# Patient Record
Sex: Female | Born: 1945
Health system: Southern US, Community
[De-identification: ages and names within clinical notes are randomized; demographics above are authoritative.]

## PROBLEM LIST (undated history)

## (undated) DIAGNOSIS — F419 Anxiety disorder, unspecified: Secondary | ICD-10-CM

## (undated) DIAGNOSIS — R5383 Other fatigue: Secondary | ICD-10-CM

## (undated) DIAGNOSIS — M6289 Other specified disorders of muscle: Secondary | ICD-10-CM

## (undated) DIAGNOSIS — R252 Cramp and spasm: Secondary | ICD-10-CM

## (undated) DIAGNOSIS — R1310 Dysphagia, unspecified: Secondary | ICD-10-CM

## (undated) DIAGNOSIS — M7989 Other specified soft tissue disorders: Secondary | ICD-10-CM

## (undated) DIAGNOSIS — Z91018 Allergy to other foods: Secondary | ICD-10-CM

## (undated) DIAGNOSIS — R0602 Shortness of breath: Secondary | ICD-10-CM

## (undated) DIAGNOSIS — R062 Wheezing: Secondary | ICD-10-CM

## (undated) DIAGNOSIS — K5900 Constipation, unspecified: Secondary | ICD-10-CM

## (undated) DIAGNOSIS — I10 Essential (primary) hypertension: Secondary | ICD-10-CM

## (undated) DIAGNOSIS — N289 Disorder of kidney and ureter, unspecified: Secondary | ICD-10-CM

## (undated) DIAGNOSIS — F329 Major depressive disorder, single episode, unspecified: Secondary | ICD-10-CM

## (undated) DIAGNOSIS — K219 Gastro-esophageal reflux disease without esophagitis: Secondary | ICD-10-CM

## (undated) DIAGNOSIS — R197 Diarrhea, unspecified: Secondary | ICD-10-CM

## (undated) DIAGNOSIS — R350 Frequency of micturition: Secondary | ICD-10-CM

## (undated) DIAGNOSIS — E119 Type 2 diabetes mellitus without complications: Secondary | ICD-10-CM

## (undated) DIAGNOSIS — J302 Other seasonal allergic rhinitis: Secondary | ICD-10-CM

## (undated) DIAGNOSIS — J3489 Other specified disorders of nose and nasal sinuses: Secondary | ICD-10-CM

## (undated) DIAGNOSIS — F32A Depression, unspecified: Secondary | ICD-10-CM

## (undated) DIAGNOSIS — E785 Hyperlipidemia, unspecified: Secondary | ICD-10-CM

## (undated) HISTORY — DX: Gastro-esophageal reflux disease without esophagitis: K21.9

## (undated) HISTORY — DX: Hyperlipidemia, unspecified: E78.5

## (undated) HISTORY — DX: Frequency of micturition: R35.0

## (undated) HISTORY — PX: FOOT SURGERY: SHX648

## (undated) HISTORY — DX: Constipation, unspecified: K59.00

## (undated) HISTORY — DX: Essential (primary) hypertension: I10

## (undated) HISTORY — DX: Other specified disorders of nose and nasal sinuses: J34.89

## (undated) HISTORY — DX: Cramp and spasm: R25.2

## (undated) HISTORY — DX: Major depressive disorder, single episode, unspecified: F32.9

## (undated) HISTORY — DX: Other fatigue: R53.83

## (undated) HISTORY — DX: Other specified disorders of muscle: M62.89

## (undated) HISTORY — DX: Allergy to other foods: Z91.018

## (undated) HISTORY — PX: MASTECTOMY: SHX3

## (undated) HISTORY — DX: Depression, unspecified: F32.A

## (undated) HISTORY — DX: Other seasonal allergic rhinitis: J30.2

## (undated) HISTORY — DX: Shortness of breath: R06.02

## (undated) HISTORY — DX: Diarrhea, unspecified: R19.7

## (undated) HISTORY — DX: Wheezing: R06.2

## (undated) HISTORY — DX: Disorder of kidney and ureter, unspecified: N28.9

## (undated) HISTORY — DX: Dysphagia, unspecified: R13.10

## (undated) HISTORY — DX: Anxiety disorder, unspecified: F41.9

## (undated) HISTORY — DX: Type 2 diabetes mellitus without complications: E11.9

## (undated) HISTORY — DX: Other specified soft tissue disorders: M79.89

---

## 1997-07-27 HISTORY — PX: CHOLECYSTECTOMY: SHX55

## 2010-07-06 ENCOUNTER — Emergency Department (HOSPITAL_COMMUNITY)
Admission: EM | Admit: 2010-07-06 | Discharge: 2010-07-06 | Payer: Self-pay | Source: Home / Self Care | Admitting: Emergency Medicine

## 2010-08-30 ENCOUNTER — Observation Stay (HOSPITAL_COMMUNITY)
Admission: EM | Admit: 2010-08-30 | Discharge: 2010-09-02 | Disposition: A | Discharge status: Home / Self Care | Payer: 59 | Attending: Internal Medicine | Admitting: Internal Medicine

## 2010-08-30 ENCOUNTER — Emergency Department (HOSPITAL_COMMUNITY): Payer: 59

## 2010-08-30 DIAGNOSIS — Z7982 Long term (current) use of aspirin: Secondary | ICD-10-CM | POA: Insufficient documentation

## 2010-08-30 DIAGNOSIS — R5383 Other fatigue: Secondary | ICD-10-CM | POA: Insufficient documentation

## 2010-08-30 DIAGNOSIS — Z9089 Acquired absence of other organs: Secondary | ICD-10-CM | POA: Insufficient documentation

## 2010-08-30 DIAGNOSIS — Z79899 Other long term (current) drug therapy: Secondary | ICD-10-CM | POA: Insufficient documentation

## 2010-08-30 DIAGNOSIS — E669 Obesity, unspecified: Secondary | ICD-10-CM | POA: Insufficient documentation

## 2010-08-30 DIAGNOSIS — E785 Hyperlipidemia, unspecified: Secondary | ICD-10-CM | POA: Insufficient documentation

## 2010-08-30 DIAGNOSIS — R059 Cough, unspecified: Secondary | ICD-10-CM | POA: Insufficient documentation

## 2010-08-30 DIAGNOSIS — R5381 Other malaise: Secondary | ICD-10-CM | POA: Insufficient documentation

## 2010-08-30 DIAGNOSIS — E119 Type 2 diabetes mellitus without complications: Principal | ICD-10-CM | POA: Insufficient documentation

## 2010-08-30 DIAGNOSIS — R05 Cough: Secondary | ICD-10-CM | POA: Insufficient documentation

## 2010-08-30 DIAGNOSIS — R002 Palpitations: Secondary | ICD-10-CM | POA: Insufficient documentation

## 2010-08-30 DIAGNOSIS — R0989 Other specified symptoms and signs involving the circulatory and respiratory systems: Secondary | ICD-10-CM | POA: Insufficient documentation

## 2010-08-30 DIAGNOSIS — Z6831 Body mass index (BMI) 31.0-31.9, adult: Secondary | ICD-10-CM | POA: Insufficient documentation

## 2010-08-30 DIAGNOSIS — R0609 Other forms of dyspnea: Secondary | ICD-10-CM | POA: Insufficient documentation

## 2010-08-30 DIAGNOSIS — I10 Essential (primary) hypertension: Secondary | ICD-10-CM | POA: Insufficient documentation

## 2010-08-30 LAB — DIFFERENTIAL
Basophils Relative: 1 % (ref 0–1)
Eosinophils Absolute: 0 10*3/uL (ref 0.0–0.7)
Eosinophils Relative: 1 % (ref 0–5)
Lymphs Abs: 1.4 10*3/uL (ref 0.7–4.0)
Neutro Abs: 1.4 10*3/uL — ABNORMAL LOW (ref 1.7–7.7)
Neutrophils Relative %: 44 % (ref 43–77)

## 2010-08-30 LAB — CARDIAC PANEL(CRET KIN+CKTOT+MB+TROPI)
CK, MB: 0.8 ng/mL (ref 0.3–4.0)
CK, MB: 0.7 ng/mL (ref 0.3–4.0)
Relative Index: INVALID (ref 0.0–2.5)
Total CK: 68 U/L (ref 7–177)
Troponin I: 0.01 ng/mL (ref 0.00–0.06)

## 2010-08-30 LAB — POCT I-STAT, CHEM 8
BUN: 5 mg/dL — ABNORMAL LOW (ref 6–23)
Calcium, Ion: 1.25 mmol/L (ref 1.12–1.32)
HCT: 44 % (ref 36.0–46.0)
Hemoglobin: 15 g/dL (ref 12.0–15.0)
Potassium: 3.6 mEq/L (ref 3.5–5.1)
Sodium: 142 mEq/L (ref 135–145)
TCO2: 26 mmol/L (ref 0–100)

## 2010-08-30 LAB — GLUCOSE, CAPILLARY: Glucose-Capillary: 125 mg/dL — ABNORMAL HIGH (ref 70–99)

## 2010-08-30 LAB — POCT CARDIAC MARKERS
CKMB, poc: <1 ng/mL — ABNORMAL LOW (ref 1.0–8.0)
CKMB, poc: <1 ng/mL — ABNORMAL LOW (ref 1.0–8.0)
Troponin i, poc: <0.05 ng/mL (ref 0.00–0.09)
Troponin i, poc: <0.05 ng/mL (ref 0.00–0.09)

## 2010-08-30 LAB — CBC
MCV: 91.7 fL (ref 78.0–100.0)
RBC: 4.72 MIL/uL (ref 3.87–5.11)
WBC: 3.3 10*3/uL — ABNORMAL LOW (ref 4.0–10.5)

## 2010-08-31 LAB — COMPREHENSIVE METABOLIC PANEL
AST: 16 U/L (ref 0–37)
Albumin: 3.8 g/dL (ref 3.5–5.2)
BUN: 10 mg/dL (ref 6–23)
CO2: 28 mEq/L (ref 19–32)
Chloride: 104 mEq/L (ref 96–112)
Creatinine, Ser: 0.99 mg/dL (ref 0.4–1.2)
GFR calc Af Amer: >60 mL/min (ref >60)
Glucose, Bld: 116 mg/dL — ABNORMAL HIGH (ref 70–99)
Potassium: 3.9 mEq/L (ref 3.5–5.1)
Total Protein: 6.6 g/dL (ref 6.0–8.3)

## 2010-08-31 LAB — CBC
Hemoglobin: 14.2 g/dL (ref 12.0–15.0)
MCHC: 34.3 g/dL (ref 30.0–36.0)
RBC: 4.5 MIL/uL (ref 3.87–5.11)

## 2010-08-31 LAB — HEMOGLOBIN A1C
Hgb A1c MFr Bld: 6 % — ABNORMAL HIGH (ref <5.7)
Mean Plasma Glucose: 126 mg/dL — ABNORMAL HIGH (ref <117)

## 2010-08-31 LAB — GLUCOSE, CAPILLARY
Glucose-Capillary: 110 mg/dL — ABNORMAL HIGH (ref 70–99)
Glucose-Capillary: 116 mg/dL — ABNORMAL HIGH (ref 70–99)

## 2010-08-31 LAB — MAGNESIUM: Magnesium: 2.3 mg/dL (ref 1.5–2.5)

## 2010-08-31 LAB — LIPID PANEL
LDL Cholesterol: 55 mg/dL (ref 0–99)
Total CHOL/HDL Ratio: 2.6 RATIO

## 2010-08-31 LAB — T4, FREE: Free T4: 1.61 ng/dL (ref 0.80–1.80)

## 2010-09-01 DIAGNOSIS — R578 Other shock: Secondary | ICD-10-CM

## 2010-09-01 DIAGNOSIS — R002 Palpitations: Secondary | ICD-10-CM

## 2010-09-01 LAB — CARDIAC PANEL(CRET KIN+CKTOT+MB+TROPI)
CK, MB: 0.9 ng/mL (ref 0.3–4.0)
Relative Index: INVALID (ref 0.0–2.5)
Troponin I: 0.01 ng/mL (ref 0.00–0.06)

## 2010-09-01 LAB — CBC
HCT: 39.8 % (ref 36.0–46.0)
Hemoglobin: 14 g/dL (ref 12.0–15.0)
MCHC: 35.2 g/dL (ref 30.0–36.0)
MCV: 91.3 fL (ref 78.0–100.0)
Platelets: 167 10*3/uL (ref 150–400)
RDW: 12.9 % (ref 11.5–15.5)
WBC: 4.3 10*3/uL (ref 4.0–10.5)

## 2010-09-01 LAB — COMPREHENSIVE METABOLIC PANEL
ALT: 20 U/L (ref 0–35)
AST: 18 U/L (ref 0–37)
Alkaline Phosphatase: 38 U/L — ABNORMAL LOW (ref 39–117)
Calcium: 9.8 mg/dL (ref 8.4–10.5)
GFR calc Af Amer: >60 mL/min (ref >60)
GFR calc non Af Amer: 60 mL/min — ABNORMAL LOW (ref >60)
Total Bilirubin: 0.6 mg/dL (ref 0.3–1.2)

## 2010-09-01 LAB — DIFFERENTIAL
Basophils Absolute: 0 10*3/uL (ref 0.0–0.1)
Basophils Relative: 1 % (ref 0–1)
Neutrophils Relative %: 42 % — ABNORMAL LOW (ref 43–77)

## 2010-09-01 LAB — MAGNESIUM: Magnesium: 2.2 mg/dL (ref 1.5–2.5)

## 2010-09-01 LAB — GLUCOSE, CAPILLARY: Glucose-Capillary: 96 mg/dL (ref 70–99)

## 2010-09-02 ENCOUNTER — Ambulatory Visit (HOSPITAL_COMMUNITY): Payer: 59

## 2010-09-02 DIAGNOSIS — R079 Chest pain, unspecified: Secondary | ICD-10-CM

## 2010-09-02 DIAGNOSIS — R002 Palpitations: Secondary | ICD-10-CM | POA: Insufficient documentation

## 2010-09-02 LAB — GLUCOSE, CAPILLARY
Glucose-Capillary: 101 mg/dL — ABNORMAL HIGH (ref 70–99)
Glucose-Capillary: 126 mg/dL — ABNORMAL HIGH (ref 70–99)

## 2010-09-02 MED ORDER — TECHNETIUM TC 99M TETROFOSMIN IV KIT
30.0000 | PACK | Freq: Once | INTRAVENOUS | Status: AC | PRN
  Administered 2010-09-02: 30 via INTRAVENOUS

## 2010-09-02 MED ORDER — TECHNETIUM TC 99M TETROFOSMIN IV KIT
10.0000 | PACK | Freq: Once | INTRAVENOUS | Status: AC | PRN
  Administered 2010-09-02: 10 via INTRAVENOUS

## 2010-09-03 ENCOUNTER — Observation Stay (HOSPITAL_COMMUNITY): Payer: 59

## 2010-09-06 NOTE — H&P (Signed)
Molly [name], Molly [name]                 ACCOUNT NO.:  [id number]  MEDICAL RECORD NO.:  [id number]           PATIENT TYPE:  E  LOCATION:  WLED                         FACILITY:  [facility]  PHYSICIAN:  Pleas Koch, MD        DATE OF BIRTH:  [birth year]  DATE OF ADMISSION:  08/30/2010 DATE OF DISCHARGE:                             HISTORY & PHYSICAL   CHIEF COMPLAINT: 1. Palpitations. 2. Chest pain.  HISTORY OF PRESENT ILLNESS:  The patient is a pleasant 65 year old African-American female who presented to the emergency room this morning status post having dyspnea and awakening with dyspnea and palpitations last p.m.  She states this is both coming going sort of chest pains made better by oxygen in the emergency room.  She states that she had a similar episode of this in July 06, 2010 and was sent home.  She states it is more of sort of tightness in the chest and deep breathing seems to make it better.  She admits that in the past couple months, she has not been as active as she used to be, used to go to the gym; however, the past 2 months, she has just been feeling more fatigued and has not been able to do as much activity as usual.  She states however she can walk up stairs and can do usual activities but a little lower than she used to.  She states the shortness of breath is worsened when she is lying flat.  She has no radiation of her pain down the arm.  She has no radiation of pain into the chest.  No reflux symptoms.  She denies any fever, cough, cold, nausea, or vomiting.  She states she has had palpitations which slows down when she controls her breathing.  She has no history of diarrhea, no shortness of breath, and no dysuria and can control her urine.  EKG in the emergency room was nonspecific.  It showed a rate about 105, axis of about 0, her PR interval is 0.12, her QT is less than half RR. There appears to be perhaps some flipped T's in V6, and she does not meet  criterion for left ventricular hypertrophy.  PAST MEDICAL HISTORY:  She has had hypertension and had recent change of medications, she is not sure what.  She has history of hyperlipidemia and diabetes mellitus.  She has never had a stroke or heart attack in the past.  She has not had a history of cancer.  PAST SURGICAL HISTORY:  Gallbladder was removed 12 years ago and it was a complicated surgery.  CURRENT MEDICATIONS: 1. Crestor. 2. Metformin. 3. Lisinopril/hydrochlorothiazide. 4. Aspirin 81 mg.  FAMILY HISTORY:  She had a sister who had heart attack at age 69.  She had a dad who had also heart issues.  She has a sister with diabetes and all the family seems to have blood pressure.  REVIEW OF SYSTEMS:  As above.  PHYSICAL EXAMINATION:  VITAL SIGNS:  Blood pressure 141/94, pulse rate 98-109, respirations 18, and pulse ox 98% on 2 L oxygen, the patient removed the oxygen  and was satting at 100% on room. GENERAL:  The patient is an alert, oriented, obese African-American female. HEENT:  No pallor.  No icterus.  Throat is clear. NECK:  No JVD.  No carotid bruit. HEART:  S1, S2.  No murmurs, rubs, or gallops.  No tactile vocal fremitus or resonance. ABDOMEN:  Soft, nontender, and nondistended.  No rebound or guarding. EXTREMITIES:  Soft.  She does not have any swelling at present.  LABORATORY DATA:  Lab work done shows WBC 3.3, hemoglobin 15.1, hematocrit 43.3, and platelets of 170,000.  Sodium 142, potassium 3.6, chloride 104, glucose 120, BUN 5, creatinine 0.9, D-dimer of 0.26, point care markers were negative.  Two-view chest x-ray showed minimal right lower lobe airspace disease, likely atelectasis.  ASSESSMENT/PLAN:  This is a 65 year old African-American female with new onset increasing shortness of breath and palpitations for the past but in reality about the past month.  We will admit her and rule out coronary artery disease.  We will get an echocardiogram to be read  by Dr. Yates Decamp. If these are normal, she and her daughter state that she has an appointment with cardiologist in [facility] and she will be reviewed there subsequent to this.  She will be placed on moderate sensitivity sliding scale and heart healthy diet and I will get an HbA1c and a fasting lipid panel on August 31, 2010.  If she rules out, we can likely safely discharge her.  She will be kept as an observation under [employer].          ______________________________ Pleas Koch, MD     JS/MEDQ  D:  08/30/2010  T:  08/30/2010  Job:  [id number]  Electronically Signed by Pleas Koch MD on 09/01/2010 11:43:10 AM

## 2010-09-10 NOTE — Consult Note (Signed)
NAMEKALANDRA, MASTERS                 ACCOUNT NO.:  [id number]  MEDICAL RECORD NO.:  [id number]           PATIENT TYPE:  LOCATION:                                 FACILITY:  PHYSICIAN:  Landan Fedie M. [place], M.D.  DATE OF BIRTH:  November 17, 1945  DATE OF CONSULTATION:  09/01/2010 DATE OF DISCHARGE:                                CONSULTATION   PRIMARY CARE PHYSICIAN:  Located at the [facility].  REASON FOR CONSULTATION:  Palpitations/chest discomfort.  HISTORY OF PRESENT ILLNESS:  Ms. Slatten is a 65 year old African American female with no known history of coronary artery disease but risk factors of hypertension, hyperlipidemia, as well as obesity, and CAD equivalent of diabetes mellitus (however, excellent control on minimal dose of metformin, only 2 years on medication) who presents with recurrent palpitations with chest tightness and orthopnea, first noted approximately 2 months ago.  The patient was in her usual state of health until she started noting mild orthopnea in November 2011 that was easily resolved with using 2-3 pills rather than 1-2 pillows.  However, in December, she noted tachy palpitations, most closely associated with lying supine and had chest tightness with these symptoms.  The patient says usually they are very brief in duration, and she can improve them by sitting up and taking a few deep breaths.  Unfortunately, in December, she had a prolonged episode, so she presented to [facility] ED for further eval.  At that time, she had two sets of enzymes checked that were negative and EKG was unconcerning and her symptoms improved in the emergency department, and therefore, she was sent home to follow up with her primary care. Unfortunately, her symptoms recurred again leading to her admission on August 30, 2010, by the hospital service again similar in character but this time lasting even longer.  Since being admitted, the patient has had two sets of  point-of-care markers that were negative and three full sets of enzymes that were negative.  Her EKG shows nonspecific changes only and labs, otherwise, unremarkable.  Chest x-ray showed minimal right lower lobe airspace disease.  Review of telemetry shows sinus bradycardia in the 50s to sinus tach in the 110s.  Of note, the patient has had mild episodes of her symptoms leading to her eval in the hospital and noted to be on telemetry in the 110s.  Vital signs within normal limits and stable.  Currently, the patient is asymptomatic and states that she normally does not have symptoms unless she lies flat or overexerts herself.  Of note, she was exercising fairly regularly up until a month ago and had no exertional symptoms at that time.  She does report now if she overexerts herself she will have similar symptoms of tachy palpitations and chest tightness, not significantly short of breath but again resolving with a few deep breaths.  The patient had a 2- D echocardiogram on August 31, 2010, that showed normal left ventricular systolic function but mild LVH.  No regional wall motion abnormalities.  Of note, her right ventricle was mildly dilated with moderate RVH and mildly reduced systolic function, but  there is no documentation of pulmonary hypertension. The patient has chronic mild lower extremity edema that resolved with her feet up that has not changed recently.  Her weight if anything has gone down over the last few weeks.  PAST MEDICAL HISTORY: 1. NIDDM (excellent control on metformin 500 mg daily x2 years). 2. Hypertension (moderate control x25 years). 3. Hyperlipidemia x8 years. 4. Obesity (BMI 31.9). 5. S/P cholecystectomy 12 years ago.  SOCIAL HISTORY:  The patient lives in [place] with her family.  She has remote tobacco abuse quitting 25 years ago.  She rarely consumes alcohol and never even comes close to binge drinking.  No illegal drugs. No herbal meds.  Regular diet  and regular exercise up until a month ago, now intermittent exercise.  FAMILY HISTORY:  The patient's father had "heart trouble" but not exactly sure of the problem, nor age of onset.  Older sister had a pacemaker placed in her late 28s.  Mother with CVA and deceased at age 61.  REVIEW OF SYSTEMS:  Please see HPI.  All other systems were reviewed and were negative.  CODE STATUS:  Full.  ALLERGIES:  No known drug allergies.  MEDICATIONS:  Metformin, lisinopril/hydrochlorothiazide, enteric-coated aspirin 81 mg p.o. daily, Crestor.  PHYSICAL EXAMINATION:  VITAL SIGNS:  Temperature 98.0 degrees Fahrenheit with BP 109-135 over 73-82, pulse 84-96, and respiration rate 16-22, O2 saturation 97% on room air. GENERAL:  The patient is alert, oriented x3, in no apparent distress, able to speak easily in full sentences without respiratory distress. HEENT:  Head is normocephalic, atraumatic.  Pupils equal, round, and reactive to light.  Extraocular movements are intact.  Nares are patent without discharge.  Oropharynx without erythema or exudates. NECK:  Supple without lymphadenopathy.  No thyromegaly.  No JVD. CARDIAC:  Heart rate is regular with audible S1 and S2.  No clicks, rubs, murmurs, or gallops, and pulses are 2+ and equal in both upper and lower extremities bilaterally. LUNGS:  Clear to auscultation bilaterally. SKIN:  No rashes, lesions, or petechiae. ABDOMEN:  Soft, nontender, nondistended.  Normal abdominal bowel sounds. No rebound or guarding.  No hepatosplenomegaly.  The patient is obese. EXTREMITIES:  Without clubbing, cyanosis, or edema. MUSCULOSKELETAL:  Without joint deformity or effusions.  No spinal or CVA tenderness. NEUROLOGIC:  Cranial nerves II through XII grossly intact.  Strength 5/5 in all extremities and axial groups.  Normal sensation throughout. Normal cerebellar function.  RADIOLOGY: 1. Chest x-ray shows minimal right lower lobe airspace disease,      question atelectasis. 2. EKG; rhythm sinus tachycardia at a rate of 105 bpm with left axis     deviation but no significant ST-T wave changes, question of     significant Q-waves in V1 only, PR 180, QRS 100, QTC of 475.  No     prior tracing for comparison.  LABORATORY DATA:  WBC is 4.3, HGB 14.0, HCT 39.8, PLT count is 167. Sodium 142, potassium 3.8, chloride 107, bicarb 27, BUN 10, creatinine 0.94, glucose 127.  Liver function tests within normal limits.  Free T4 and free T3 both within normal limits.  HbA1c 6.0%.  Magnesium 2.2, calcium 9.8.  Total cholesterol 110, triglyceride 60, HDL 43, LDL 55. Point-of-care markers negative x2.  Three full sets of enzymes negative.  ASSESSMENT AND PLAN:  The patient was initially seen by Jarrett Ables, PA-C, and then seen and thoroughly assessed by attending cardiologist, Dr. Saxon Crosby [place].  Ms. Stanke is a 65 year old African American female with  no known history of coronary artery disease but coronary artery disease equivalent of diabetes mellitus (excellent control on low-dose metformin times only 2 years), and risk factors of moderately controlled hypertension x25 years, well-controlled hyperlipidemia, and obesity who presents with mostly atypical chest discomfort without objective evidence of ischemia.  Chest pain.  Given her recurrent symptoms and higher pretest probability even in the absence of objective evidence and her mostly atypical history, it is reasonable to perform an inpatient treadmill stress Myoview.  We will arrange for tomorrow morning.  If negative, no further workup indicated at this time.  Thanks for the consult.     Jarrett Ables, PAC   ______________________________ Robertine Kipper M. [place], M.D.    MS/MEDQ  D:  09/01/2010  T:  09/02/2010  Job:  [id number]  Electronically Signed by Jarrett Ables PAC on 09/08/2010 11:18:57 AM Electronically Signed by Vicky Schleich [place] M.D. on 09/10/2010 11:16:27 AM

## 2010-09-10 NOTE — Discharge Summary (Signed)
  Molly [name], Molly [name]                 ACCOUNT NO.:  [id number]  MEDICAL RECORD NO.:  [id number]           PATIENT TYPE:  I  LOCATION:  1401                         FACILITY:  [facility]  PHYSICIAN:  Zannie Cove, MD     DATE OF BIRTH:  September 23, 1945  DATE OF ADMISSION:  08/30/2010 DATE OF DISCHARGE:  09/02/2010                              DISCHARGE SUMMARY   PRIMARY CARE PHYSICIAN:  Dr. Ivory Broad, [facility].  DISCHARGE DIAGNOSES: 1. Atypical chest pain. 2. Diabetes type 2. 3. Hypertension. 4. Dyslipidemia. 5. Obesity. 6. Status post cholecystectomy. 7. Suspected obstructive sleep apnea.  Sleep study pending.  CONSULTATION:  Dr. Royann Shivers in [facility].  DISCHARGE MEDICATIONS: 1. Aspirin 81 mg daily. 2. Fish oil over the counter 1 tablet daily. 3. Crestor 10 mg daily. 4. Lisinopril/hydrochlorothiazide 20/12.5 one tablet daily. 5. Multivitamin 1 tablet daily. 6. Metformin XR 500 mg p.o. at bedtime.  DIAGNOSTIC INVESTIGATIONS:  Chest x-ray, minimal right lower lobe airspace disease, slightly atelectasis.  Myoview September 02, 2010, no evidence of pharmacologically-induced ischemia.  HOSPITAL COURSE:  [name] is a 65 year old female with history of diabetes, hypertension, dyslipidemia, and obesity who presented to the hospital with chest discomfort that subsequently resolved.  She was ruled out for MI subsequently due to risk factors, seen by [facility] in consultation with a Myoview, which did not show any evidence of ischemia.  She also had a 2-D echocardiogram that showed normal ejection fraction, EF of 60% to 65% with normal wall motion, mild LVH, right ventricle mildly dilated, systolic function mildly decreased, it may represent chronic cor pulmonale.  Clinical correlation recommended.  In addition, the patient's daughter also reports unexplained fatigue and dyspnea on exertion.  For this, we recommended a sleep study to be done as an outpatient  and the patient is given the number for [facility].  DISCHARGE CONDITION:  Stable.  VITAL SIGNS:  Temperature is 98, pulse 98, blood pressure 118/81, respirations 16, saturating 100% on room air.     Zannie Cove, MD     PJ/MEDQ  D:  09/02/2010  T:  09/03/2010  Job:  [id number]  cc:   Dr. Ivory Broad  Electronically Signed by Zannie Cove  on 09/10/2010 02:14:06 PM

## 2010-09-17 ENCOUNTER — Institutional Professional Consult (permissible substitution): Payer: 59 | Admitting: Pulmonary Disease

## 2010-10-07 LAB — CBC
MCH: 31.7 pg (ref 26.0–34.0)
MCHC: 34.5 g/dL (ref 30.0–36.0)
Platelets: 171 10*3/uL (ref 150–400)
RDW: 12.7 % (ref 11.5–15.5)

## 2010-10-07 LAB — DIFFERENTIAL
Basophils Absolute: 0 10*3/uL (ref 0.0–0.1)
Basophils Relative: 0 % (ref 0–1)
Eosinophils Absolute: 0 10*3/uL (ref 0.0–0.7)
Neutro Abs: 2.2 10*3/uL (ref 1.7–7.7)
Neutrophils Relative %: 49 % (ref 43–77)

## 2010-10-07 LAB — BASIC METABOLIC PANEL
CO2: 27 mEq/L (ref 19–32)
Calcium: 10 mg/dL (ref 8.4–10.5)
Creatinine, Ser: 0.94 mg/dL (ref 0.4–1.2)
GFR calc non Af Amer: 60 mL/min — ABNORMAL LOW (ref >60)
Glucose, Bld: 134 mg/dL — ABNORMAL HIGH (ref 70–99)
Sodium: 141 mEq/L (ref 135–145)

## 2010-10-07 LAB — D-DIMER, QUANTITATIVE: D-Dimer, Quant: <0.22 ug/mL-FEU (ref 0.00–0.48)

## 2010-10-07 LAB — TROPONIN I: Troponin I: 0.02 ng/mL (ref 0.00–0.06)

## 2011-07-08 ENCOUNTER — Other Ambulatory Visit: Payer: Self-pay | Admitting: Neurology

## 2011-07-08 DIAGNOSIS — F411 Generalized anxiety disorder: Secondary | ICD-10-CM

## 2011-07-08 DIAGNOSIS — F060 Psychotic disorder with hallucinations due to known physiological condition: Secondary | ICD-10-CM

## 2011-07-08 DIAGNOSIS — F4323 Adjustment disorder with mixed anxiety and depressed mood: Secondary | ICD-10-CM

## 2011-12-31 IMAGING — CR DG CHEST 2V
2 series · 2 of 2 positions shown · non-contrast
Comparison: None.

CLINICAL DATA: Shortness of breath

CHEST - 2 VIEW

[w chest pa]
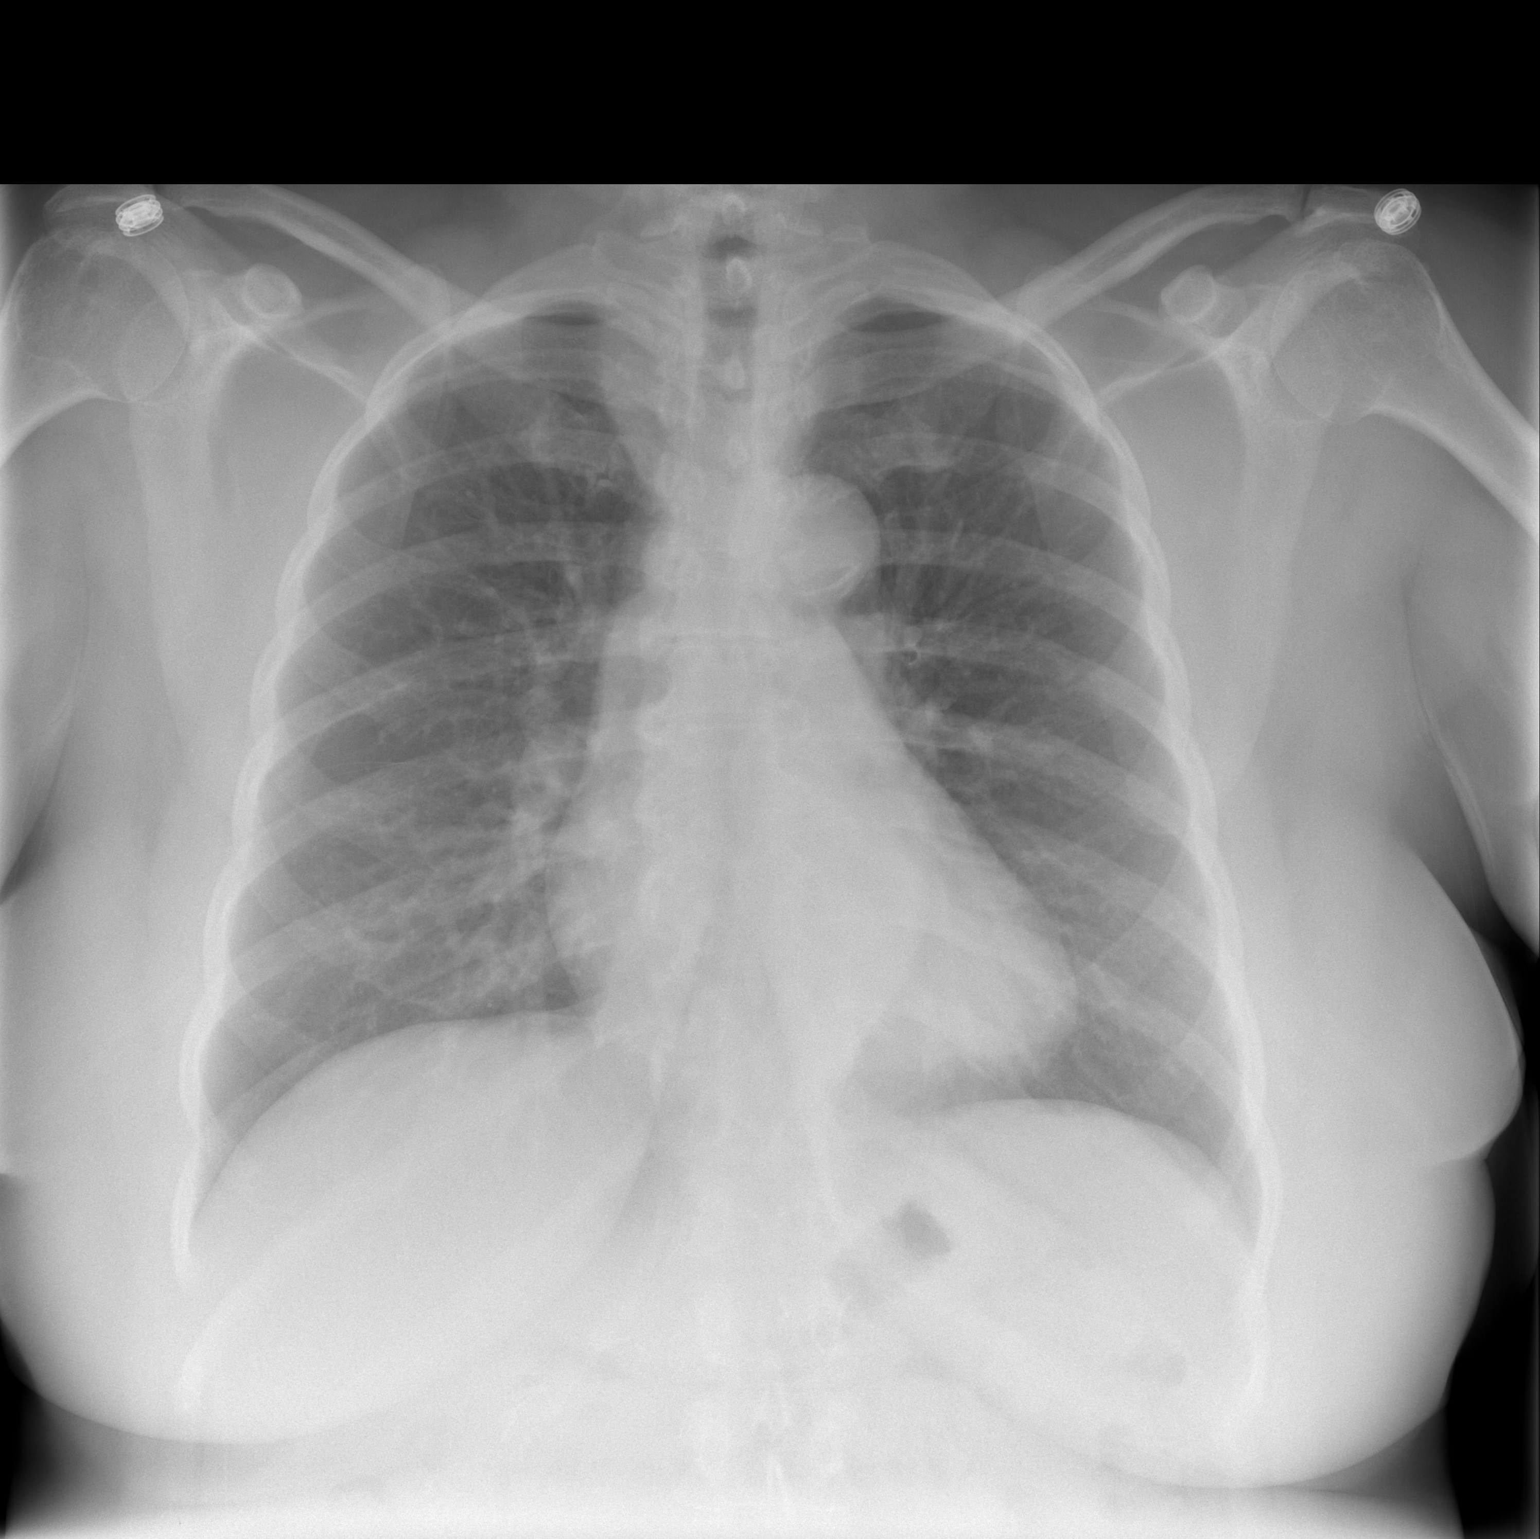

[w chest lat]
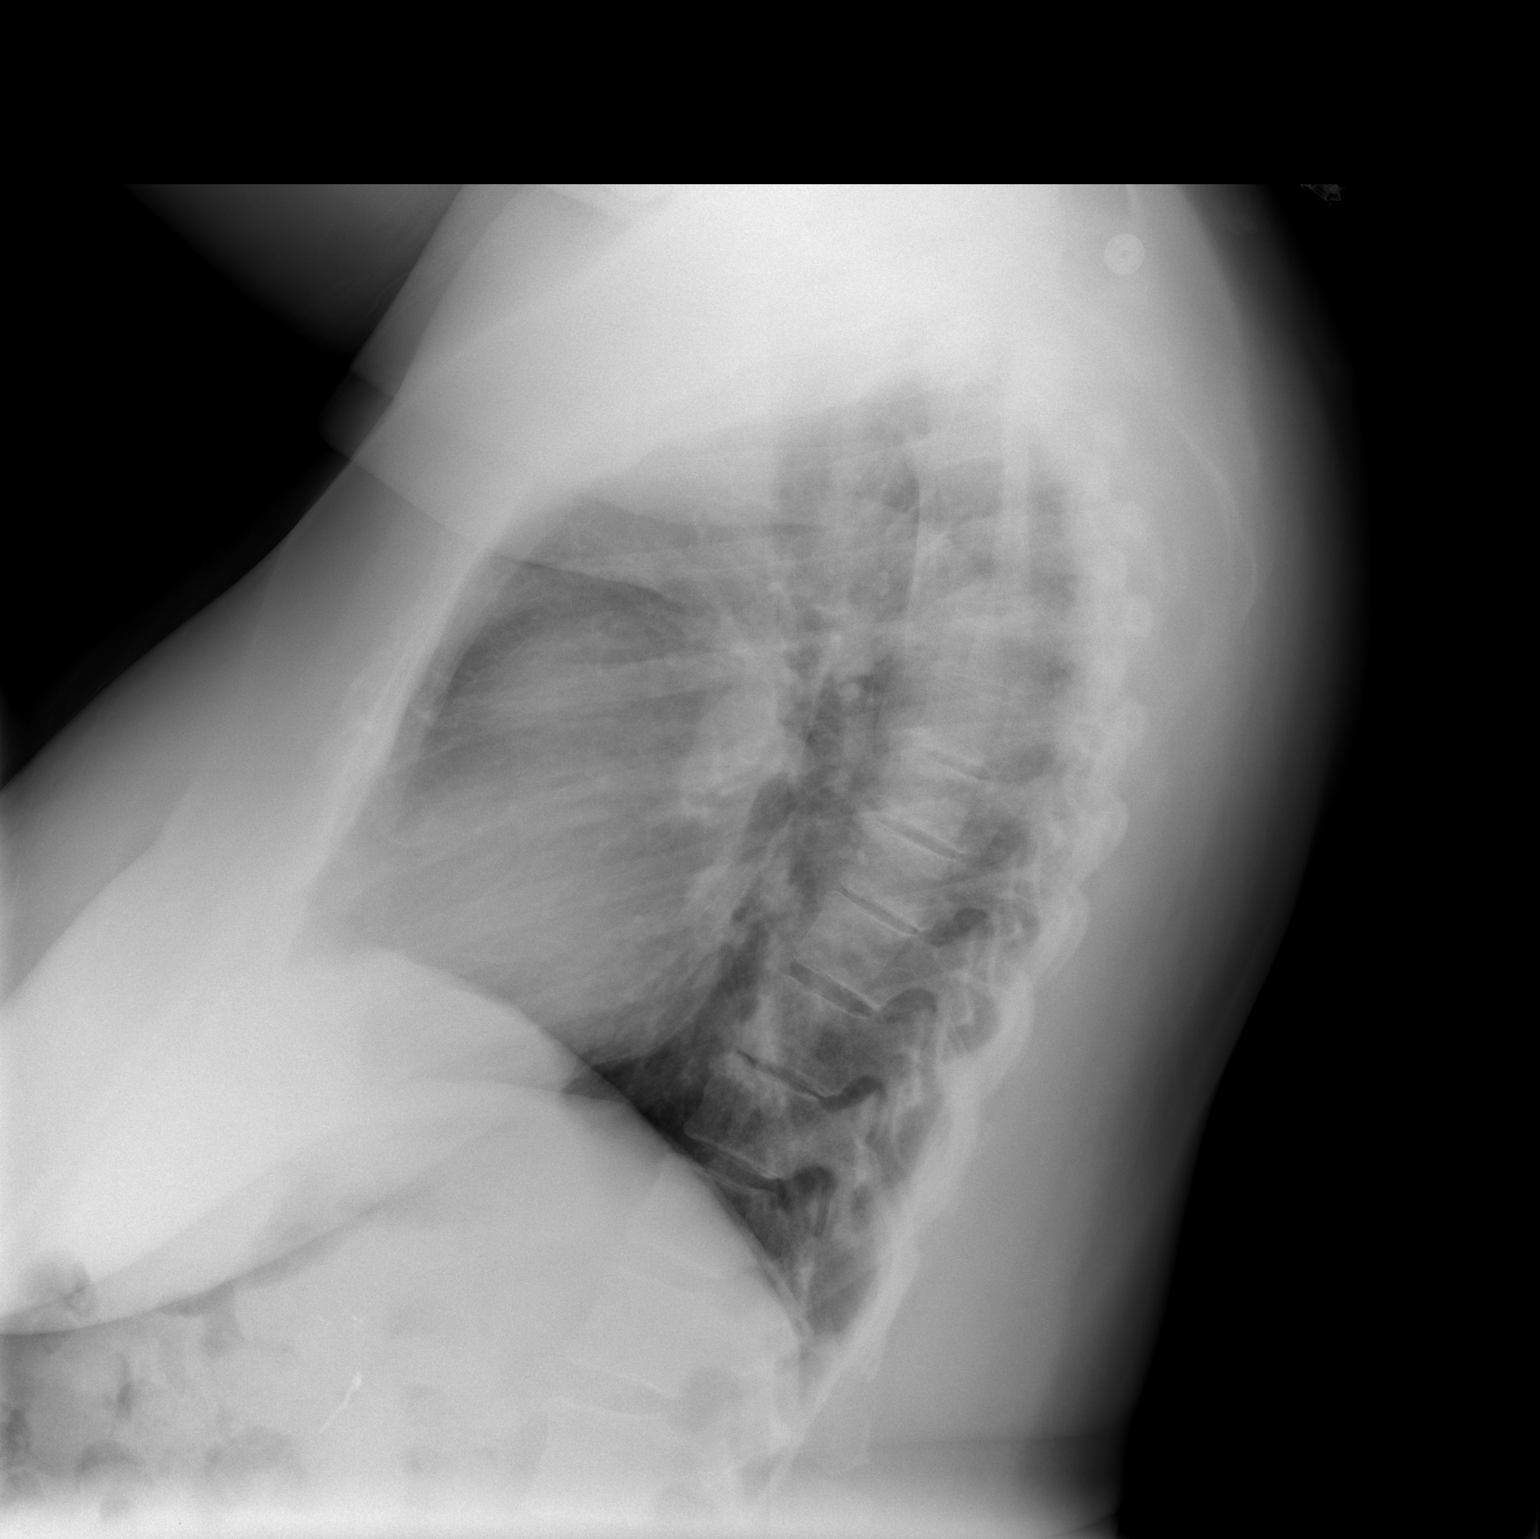

[2 of 2 positions shown; findings below may reference images not displayed]

FINDINGS: Aorta is ectatic and unfolded.  Heart size is normal.
The lungs are clear.  No pleural effusion. Cholecystectomy clips
noted.
IMPRESSION: No acute cardiopulmonary process.

## 2016-03-23 ENCOUNTER — Ambulatory Visit (INDEPENDENT_AMBULATORY_CARE_PROVIDER_SITE_OTHER): Payer: Medicare HMO | Admitting: Allergy and Immunology

## 2016-03-23 ENCOUNTER — Encounter: Payer: Self-pay | Admitting: Allergy and Immunology

## 2016-03-23 VITALS — BP 126/88 | HR 96 | Temp 98.0°F | Resp 16 | Ht 63.0 in | Wt 224.8 lb

## 2016-03-23 DIAGNOSIS — L500 Allergic urticaria: Secondary | ICD-10-CM | POA: Diagnosis not present

## 2016-03-23 DIAGNOSIS — T7800XA Anaphylactic reaction due to unspecified food, initial encounter: Secondary | ICD-10-CM

## 2016-03-23 DIAGNOSIS — K2970 Gastritis, unspecified, without bleeding: Secondary | ICD-10-CM

## 2016-03-23 DIAGNOSIS — T783XXA Angioneurotic edema, initial encounter: Secondary | ICD-10-CM | POA: Diagnosis not present

## 2016-03-23 DIAGNOSIS — J3089 Other allergic rhinitis: Secondary | ICD-10-CM | POA: Diagnosis not present

## 2016-03-23 MED ORDER — LEVOCETIRIZINE DIHYDROCHLORIDE 5 MG PO TABS
5.0000 mg | ORAL_TABLET | Freq: Every evening | ORAL | 5 refills | Status: DC
Start: 2016-03-23 — End: 2022-10-05

## 2016-03-23 MED ORDER — EPINEPHRINE 0.3 MG/0.3ML IJ SOAJ: 0.3000 mg | Freq: Once | INTRAMUSCULAR | 2 refills | Status: AC

## 2016-03-23 MED ORDER — FLUTICASONE PROPIONATE 50 MCG/ACT NA SUSP: 2.0000 | Freq: Every day | NASAL | 5 refills | Status: DC | PRN

## 2016-03-23 NOTE — Assessment & Plan Note (Signed)
Associated angioedema occurs in up to 50% of patients with chronic urticaria.  Treatment/diagnostic plan as outlined above. 

## 2016-03-23 NOTE — Patient Instructions (Addendum)
Recurrent urticaria Unclear etiology. Skin tests to select food allergens were negative today with the exception of shellfish mix, however her recent symptoms have not correlated with the consumption of shellfish. NSAIDs and emotional stress commonly exacerbate urticaria but are not the underlying etiology in this case. Physical urticarias are negative by history (i.e. pressure-induced, temperature, vibration, solar, etc.). There are no concomitant symptoms concerning for anaphylaxis or constitutional symptoms worrisome for an underlying malignancy. We will rule out other potential etiologies with labs. For symptom relief, patient is to take oral antihistamines as directed.  The following labs have been ordered: FCeRI antibody, TSH, anti-thyroglobulin antibody, thyroid peroxidase antibody, tryptase, urea breath test, CBC, CMP, ESR, ANA, and galactose-alpha-1,3-galactose IgE level.  The patient will be called with further recommendations after lab results have returned.  Instructions have been discussed and provided for H1/H2 receptor blockade with titration to find lowest effective dose.  A prescription has been provided for levocetirizine, 5 mg daily as needed.  A journal is to be kept recording any foods eaten, beverages consumed, medications taken within a 6 hour period prior to the onset of symptoms, as well as record activities being performed, and environmental conditions. For any symptoms concerning for anaphylaxis, 911 is to be called immediately.  Angioedema Associated angioedema occurs in up to 50% of patients with chronic urticaria.  Treatment/diagnostic plan as outlined above.  Food allergy The patient's history suggests shellfish allergy and positive skin test results today confirm this diagnosis.  Meticulous avoidance of shellfish as discussed.  A prescription has been provided for epinephrine auto-injector 2 pack along with instructions for proper administration.  A food  allergy action plan has been provided and discussed.  Medic Alert identification is recommended.  Other allergic rhinitis  Aeroallergen avoidance measures have been discussed and provided in written form.  A prescription has been provided for levocetirizine (as above).  A prescription has been provided for fluticasone nasal spray, 2 sprays per nostril daily as needed. Proper nasal spray technique has been discussed and demonstrated.  I have also recommended nasal saline spray (i.e. Simply Saline) as needed prior to medicated nasal sprays.   When lab results have returned the patient will be called with further recommendations and follow up instructions.  Urticaria (Hives)  . Levocetirizine (Xyzal) 5 mg twice a day and ranitidine (Zantac) 150 mg twice a day. If no symptoms for 7-14 days then decrease to. . Levocetirizine (Xyzal) 5 mg twice a day and ranitidine (Zantac) 150 mg once a day.  If no symptoms for 7-14 days then decrease to. . Levocetirizine (Xyzal) 5 mg twice a day.  If no symptoms for 7-14 days then decrease to. . Levocetirizine (Xyzal) 5 mg once a day.  May use Benadryl (diphenhydramine) as needed for breakthrough symptoms       If symptoms return, then step up dosage  Reducing Pollen Exposure  The American Academy of Allergy, Asthma and Immunology suggests the following steps to reduce your exposure to pollen during allergy seasons.    1. Do not hang sheets or clothing out to dry; pollen may collect on these items. 2. Do not mow lawns or spend time around freshly cut grass; mowing stirs up pollen. 3. Keep windows closed at night.  Keep car windows closed while driving. 4. Minimize morning activities outdoors, a time when pollen counts are usually at their highest. 5. Stay indoors as much as possible when pollen counts or humidity is high and on windy days when pollen tends to remain  in the air longer. 6. Use air conditioning when possible.  Many air conditioners have  filters that trap the pollen spores. 7. Use a HEPA room air filter to remove pollen form the indoor air you breathe.

## 2016-03-23 NOTE — Assessment & Plan Note (Signed)
The patient's history suggests shellfish allergy and positive skin test results today confirm this diagnosis.  Meticulous avoidance of shellfish as discussed.  A prescription has been provided for epinephrine auto-injector 2 pack along with instructions for proper administration.  A food allergy action plan has been provided and discussed.  Medic Alert identification is recommended. 

## 2016-03-23 NOTE — Assessment & Plan Note (Signed)
Unclear etiology. Skin tests to select food allergens were negative today with the exception of shellfish mix, however her recent symptoms have not correlated with the consumption of shellfish. NSAIDs and emotional stress commonly exacerbate urticaria but are not the underlying etiology in this case. Physical urticarias are negative by history (i.e. pressure-induced, temperature, vibration, solar, etc.). There are no concomitant symptoms concerning for anaphylaxis or constitutional symptoms worrisome for an underlying malignancy. We will rule out other potential etiologies with labs. For symptom relief, patient is to take oral antihistamines as directed.  The following labs have been ordered: FCeRI antibody, TSH, anti-thyroglobulin antibody, thyroid peroxidase antibody, tryptase, urea breath test, CBC, CMP, ESR, ANA, and galactose-alpha-1,3-galactose IgE level.  The patient will be called with further recommendations after lab results have returned.  Instructions have been discussed and provided for H1/H2 receptor blockade with titration to find lowest effective dose.  A prescription has been provided for levocetirizine, 5 mg daily as needed.  A journal is to be kept recording any foods eaten, beverages consumed, medications taken within a 6 hour period prior to the onset of symptoms, as well as record activities being performed, and environmental conditions. For any symptoms concerning for anaphylaxis, 911 is to be called immediately.

## 2016-03-23 NOTE — Assessment & Plan Note (Signed)
   Aeroallergen avoidance measures have been discussed and provided in written form.  A prescription has been provided for levocetirizine (as above).  A prescription has been provided for fluticasone nasal spray, 2 sprays per nostril daily as needed. Proper nasal spray technique has been discussed and demonstrated.  I have also recommended nasal saline spray (i.e. Simply Saline) as needed prior to medicated nasal sprays.

## 2016-03-23 NOTE — Progress Notes (Signed)
New Patient Note  RE: Molly [name] MRN: [id number] DOB: [birth year] Date of Office Visit: 03/23/2016  Referring provider: Daphene Calamity,* Primary care provider: Daphene Calamity, MD  Chief Complaint: Urticaria and Angioedema   History of present illness: Molly [name] is a 70 y.o. female presenting today for consultation of hives. Over the past 1-2 months, [name] has experienced recurrent episodes of hives. Typical distribution includes the face, back, arms and legs.  The lesions are described as erythematous, raised, and pruritic.  Individual hives last less than 24 hours without leaving residual pigmentation or bruising. She has experienced associated facial swelling 4-5 times but denies concomitant cardiopulmonary symptoms and GI symptoms. She has not experienced unexpected weight loss, recurrent fevers or drenching night sweats. No specific medication, food or environmental triggers have been identified. The symptoms do not seem to correlate with NSAIDs use or emotional stress. She did not have signs or symptoms of infection at the time of symptom onset. [name] has tried to control symptoms with OTC antihistamines which have offered moderate temporary relief of symptoms. She has not been evaluated and treated in the emergency department for these symptoms. Skin biopsy has not been performed. Currently developing hives 2 times per week on average. [name] experiences nasal congestion, rhinorrhea, sneezing, and occasional ear pressure.  These symptoms occur year around but are more frequent in the spring and fall.   Assessment and plan: Recurrent urticaria Unclear etiology. Skin tests to select food allergens were negative today with the exception of shellfish mix, however her recent symptoms have not correlated with the consumption of shellfish. NSAIDs and emotional stress commonly exacerbate urticaria but are not the underlying etiology in this case. Physical urticarias are negative by  history (i.e. pressure-induced, temperature, vibration, solar, etc.). There are no concomitant symptoms concerning for anaphylaxis or constitutional symptoms worrisome for an underlying malignancy. We will rule out other potential etiologies with labs. For symptom relief, patient is to take oral antihistamines as directed.  The following labs have been ordered: FCeRI antibody, TSH, anti-thyroglobulin antibody, thyroid peroxidase antibody, tryptase, urea breath test, CBC, CMP, ESR, ANA, and galactose-alpha-1,3-galactose IgE level.  The patient will be called with further recommendations after lab results have returned.  Instructions have been discussed and provided for H1/H2 receptor blockade with titration to find lowest effective dose.  A prescription has been provided for levocetirizine, 5 mg daily as needed.  A journal is to be kept recording any foods eaten, beverages consumed, medications taken within a 6 hour period prior to the onset of symptoms, as well as record activities being performed, and environmental conditions. For any symptoms concerning for anaphylaxis, 911 is to be called immediately.  Angioedema Associated angioedema occurs in up to 50% of patients with chronic urticaria.  Treatment/diagnostic plan as outlined above.  Food allergy The patient's history suggests shellfish allergy and positive skin test results today confirm this diagnosis.  Meticulous avoidance of shellfish as discussed.  A prescription has been provided for epinephrine auto-injector 2 pack along with instructions for proper administration.  A food allergy action plan has been provided and discussed.  Medic Alert identification is recommended.  Other allergic rhinitis  Aeroallergen avoidance measures have been discussed and provided in written form.  A prescription has been provided for levocetirizine (as above).  A prescription has been provided for fluticasone nasal spray, 2 sprays per nostril  daily as needed. Proper nasal spray technique has been discussed and demonstrated.  I have also recommended nasal saline spray (i.e. Simply  Saline) as needed prior to medicated nasal sprays.   Meds ordered this encounter  Medications  . levocetirizine (XYZAL) 5 MG tablet    Sig: Take 1 tablet (5 mg total) by mouth every evening.    Dispense:  30 tablet    Refill:  5  . EPINEPHrine (EPIPEN 2-PAK) 0.3 mg/0.3 mL IJ SOAJ injection    Sig: Inject 0.3 mLs (0.3 mg total) into the muscle once.    Dispense:  2 Device    Refill:  2    Dispense either Brand Epipen or Mylan Generic only.  . fluticasone (FLONASE) 50 MCG/ACT nasal spray    Sig: Place 2 sprays into both nostrils daily as needed for allergies or rhinitis.    Dispense:  16 g    Refill:  5    Diagnositics:  Environmental allergen skin tests: Positive to grass pollens, ragweed pollen, weed pollens, and tree pollens. Food allergen skin tests: Positive to shellfish mix.  Physical examination: Blood pressure 126/88, pulse 96, temperature 98 F (36.7 C), temperature source Oral, resp. rate 16, height _0  (1.6 m), weight 224 lb 12.8 oz (102 kg).  General: Alert, interactive, in no acute distress. HEENT: TMs pearly gray, turbinates mildly edematous without discharge, post-pharynx mildly erythematous. Neck: Supple without lymphadenopathy. Lungs: Clear to auscultation without wheezing, rhonchi or rales. CV: Normal S1, S2 without murmurs. Abdomen: Nondistended, nontender. Skin: Warm and dry, without lesions or rashes. Extremities:  No clubbing, cyanosis or edema. Neuro:   Grossly intact.  Review of systems:  Review of systems negative except as noted in HPI / PMHx or noted below: Review of Systems  Constitutional: Negative.   HENT: Negative.   Eyes: Negative.   Respiratory: Negative.   Cardiovascular: Negative.   Gastrointestinal: Negative.   Genitourinary: Negative.   Musculoskeletal: Negative.   Skin: Negative.     Neurological: Negative.   Endo/Heme/Allergies: Negative.   Psychiatric/Behavioral: Negative.     Past medical history:  Past Medical History:  Diagnosis Date  . Diabetes type 2, controlled ([facility])   . Hypertension     Past surgical history:  Past Surgical History:  Procedure Laterality Date  . CHOLECYSTECTOMY  1999    Family history: Family History  Problem Relation Age of Onset  . Allergic rhinitis Sister   . Eczema Grandchild   . Eczema Daughter   . Urticaria Daughter   . Lupus Other   . Asthma Neg Hx   . Atopy Neg Hx   . Angioedema Neg Hx     Social history: Social History   Social History  . Marital status: Married    Spouse name: N/A  . Number of children: N/A  . Years of education: N/A   Occupational History  . Not on file.   Social History Main Topics  . Smoking status: Former Smoker    Types: Cigarettes    Quit date: 03/23/1998  . Smokeless tobacco: Never Used  . Alcohol use Yes     Comment: occasional  . Drug use: No  . Sexual activity: Not on file   Other Topics Concern  . Not on file   Social History Narrative  . No narrative on file   Environmental History: The patient lives in a house with carpeting in the bedroom and central air/heat.  She is a nonsmoker without pets.    Medication List       Accurate as of 03/23/16  6:47 PM. Always use your most recent med list.  amLODipine 10 MG tablet Commonly known as:  NORVASC Take 10 mg by mouth daily.   EPINEPHrine 0.3 mg/0.3 mL Soaj injection Commonly known as:  EPIPEN 2-PAK Inject 0.3 mLs (0.3 mg total) into the muscle once.   FISH OIL PO Take 1 tablet by mouth daily.   fluticasone 50 MCG/ACT nasal spray Commonly known as:  FLONASE Place 2 sprays into both nostrils daily as needed for allergies or rhinitis.   furosemide 20 MG tablet Commonly known as:  LASIX Take 20 mg by mouth as needed.   levocetirizine 5 MG tablet Commonly known as:  XYZAL Take 1 tablet (5 mg  total) by mouth every evening.   losartan-hydrochlorothiazide 100-25 MG tablet Commonly known as:  HYZAAR   metFORMIN 500 MG tablet Commonly known as:  GLUCOPHAGE Take 2 in the AM and 1 in the PM   MULTIVITAMIN ADULT PO Take 1 tablet by mouth daily.   rosuvastatin 10 MG tablet Commonly known as:  CRESTOR Take 10 mg by mouth daily.       Known medication allergies: No Known Allergies  I appreciate the opportunity to take part in Stachia's care. Please do not hesitate to contact me with questions.  Sincerely,   R. Edgar Frisk, MD

## 2016-03-24 LAB — COMPREHENSIVE METABOLIC PANEL
ALT: 16 U/L (ref 6–29)
AST: 19 U/L (ref 10–35)
Albumin: 4.5 g/dL (ref 3.6–5.1)
Alkaline Phosphatase: 44 U/L (ref 33–130)
BUN: 11 mg/dL (ref 7–25)
CO2: 28 mmol/L (ref 20–31)
Calcium: 10.1 mg/dL (ref 8.6–10.4)
Chloride: 103 mmol/L (ref 98–110)
Creat: 0.9 mg/dL (ref 0.60–0.93)
Glucose, Bld: 108 mg/dL — ABNORMAL HIGH (ref 65–99)
Potassium: 3.3 mmol/L — ABNORMAL LOW (ref 3.5–5.3)
Sodium: 142 mmol/L (ref 135–146)
Total Bilirubin: 0.5 mg/dL (ref 0.2–1.2)
Total Protein: 7.6 g/dL (ref 6.1–8.1)

## 2016-03-24 LAB — CBC WITH DIFFERENTIAL/PLATELET
Basophils Absolute: 0 cells/uL (ref 0–200)
Basophils Relative: 0 %
Eosinophils Absolute: 72 cells/uL (ref 15–500)
Eosinophils Relative: 2 %
HCT: 43.7 % (ref 35.0–45.0)
Hemoglobin: 14.3 g/dL (ref 11.7–15.5)
Lymphocytes Relative: 48 %
Lymphs Abs: 1728 cells/uL (ref 850–3900)
MCH: 30.7 pg (ref 27.0–33.0)
MCHC: 32.7 g/dL (ref 32.0–36.0)
MCV: 93.8 fL (ref 80.0–100.0)
MPV: 10.6 fL (ref 7.5–12.5)
Monocytes Absolute: 324 cells/uL (ref 200–950)
Monocytes Relative: 9 %
Neutro Abs: 1476 cells/uL — ABNORMAL LOW (ref 1500–7800)
Neutrophils Relative %: 41 %
Platelets: 185 10*3/uL (ref 140–400)
RBC: 4.66 MIL/uL (ref 3.80–5.10)
RDW: 13.6 % (ref 11.0–15.0)
WBC: 3.6 10*3/uL — ABNORMAL LOW (ref 3.8–10.8)

## 2016-03-24 LAB — H. PYLORI BREATH TEST: H. pylori Breath Test: DETECTED — AB

## 2016-03-24 LAB — ANA: Anti Nuclear Antibody(ANA): NEGATIVE

## 2016-03-24 LAB — SEDIMENTATION RATE: Sed Rate: 3 mm/hr (ref 0–30)

## 2016-03-24 LAB — TRYPTASE: Tryptase: 5.6 ug/L (ref <11)

## 2016-03-26 LAB — ALPHA-GAL PANEL
Allergen, Mutton, f88: <0.1 kU/L
Allergen, Pork, f26: <0.1 kU/L
Beef: <0.1 kU/L
Galactose-alpha-1,3-galactose IgE: <0.1 kU/L (ref <0.35)

## 2016-03-30 LAB — CP CHRONIC URTICARIA INDEX PANEL
Histamine Release: <16 % (ref <16)
TSH: 1.57 mIU/L
Thyroglobulin Ab: <1 IU/mL (ref <2)
Thyroperoxidase Ab SerPl-aCnc: 10 IU/mL — ABNORMAL HIGH (ref <9)

## 2016-04-01 ENCOUNTER — Telehealth: Payer: Self-pay

## 2016-04-01 MED ORDER — OMEPRAZOLE 20 MG PO CPDR: DELAYED_RELEASE_CAPSULE | ORAL | 0 refills | Status: DC

## 2016-04-01 MED ORDER — CLARITHROMYCIN 500 MG PO TABS: ORAL_TABLET | ORAL | 0 refills | Status: DC

## 2016-04-01 MED ORDER — AMOXICILLIN 500 MG PO TABS: ORAL_TABLET | ORAL | 0 refills | Status: DC

## 2016-04-06 NOTE — Telephone Encounter (Signed)
Called pt. And let her know that Dr. Verlin Fester  Wanted to see her back in 3 weeks. Pt. Scheduled.

## 2016-04-29 ENCOUNTER — Ambulatory Visit: Payer: Medicare HMO | Admitting: Allergy and Immunology

## 2016-04-30 ENCOUNTER — Ambulatory Visit (INDEPENDENT_AMBULATORY_CARE_PROVIDER_SITE_OTHER): Payer: Medicare HMO | Admitting: Allergy and Immunology

## 2016-04-30 ENCOUNTER — Encounter: Payer: Self-pay | Admitting: Allergy and Immunology

## 2016-04-30 DIAGNOSIS — J3089 Other allergic rhinitis: Secondary | ICD-10-CM

## 2016-04-30 DIAGNOSIS — L500 Allergic urticaria: Secondary | ICD-10-CM

## 2016-04-30 DIAGNOSIS — T7800XD Anaphylactic reaction due to unspecified food, subsequent encounter: Secondary | ICD-10-CM | POA: Diagnosis not present

## 2016-04-30 NOTE — Assessment & Plan Note (Signed)
   Continue appropriate allergen avoidance measures, levocetirizine as needed, and fluticasone nasal spray as needed.

## 2016-04-30 NOTE — Patient Instructions (Signed)
Recurrent urticaria Improved and well-controlled.  Continue H1/H2 receptor blockade, titrating to the lowest effective dose necessary to suppress urticaria.  Other allergic rhinitis  Continue appropriate allergen avoidance measures, levocetirizine as needed, and fluticasone nasal spray as needed.  Food allergy  Continue meticulous avoidance of shellfish and have access to epinephrine autoinjector 2 pack in case of accidental ingestion.  Food allergy action plan is in place.   Return in about 1 year (around 04/30/2017), or if symptoms worsen or fail to improve.

## 2016-04-30 NOTE — Assessment & Plan Note (Signed)
Improved and well-controlled.  Continue H1/H2 receptor blockade, titrating to the lowest effective dose necessary to suppress urticaria.

## 2016-04-30 NOTE — Progress Notes (Signed)
    Follow-up Note  RE: Molly [name] MRN: [id number] DOB: 08/24/1945 Date of Office Visit: 04/30/2016  Primary care provider: Daphene Calamity, MD Referring provider: Daphene Calamity,*  History of present illness: Molly [name] is a 70 y.o. female with a history of urticaria and angioedema presenting today for follow up.  She was most recently seen in this clinic on 03/23/2016 for her initial evaluation.  She reports that in the interval since her previous visit she has not had recurrence of angioedema and urticaria has improved significantly regarding frequency and severity of symptoms.  She currently only requires levocetirizine 5 mg as needed to suppress occasional mild urticaria.  She completed the course of triple therapy for H. Pylori.  She has no nasal symptom complaints today.  She continues to carefully avoid shellfish and has access to epinephrine autoinjectors in case of accidental ingestion.   Assessment and plan: Recurrent urticaria Improved and well-controlled.  Continue H1/H2 receptor blockade, titrating to the lowest effective dose necessary to suppress urticaria.  Other allergic rhinitis  Continue appropriate allergen avoidance measures, levocetirizine as needed, and fluticasone nasal spray as needed.  Food allergy  Continue meticulous avoidance of shellfish and have access to epinephrine autoinjector 2 pack in case of accidental ingestion.  Food allergy action plan is in place.   Physical examination: Blood pressure (!) 146/84, pulse 84, temperature 98.3 F (36.8 C), temperature source Oral, resp. rate 20, SpO2 96 %.  General: Alert, interactive, in no acute distress. HEENT: TMs pearly gray, turbinates mildly edematous without discharge, post-pharynx mildly erythematous. Neck: Supple without lymphadenopathy. Lungs: Clear to auscultation without wheezing, rhonchi or rales. CV: Normal S1, S2 without murmurs. Skin: Warm and dry, without lesions or  rashes.  The following portions of the patient's history were reviewed and updated as appropriate: allergies, current medications, past family history, past medical history, past social history, past surgical history and problem list.    Medication List       Accurate as of 04/30/16  6:44 PM. Always use your most recent med list.          amLODipine 10 MG tablet Commonly known as:  NORVASC Take 10 mg by mouth daily.   escitalopram 20 MG tablet Commonly known as:  LEXAPRO   FISH OIL PO Take 1 tablet by mouth daily.   fluticasone 50 MCG/ACT nasal spray Commonly known as:  FLONASE Place 2 sprays into both nostrils daily as needed for allergies or rhinitis.   furosemide 20 MG tablet Commonly known as:  LASIX Take 20 mg by mouth as needed.   levocetirizine 5 MG tablet Commonly known as:  XYZAL Take 1 tablet (5 mg total) by mouth every evening.   losartan-hydrochlorothiazide 100-25 MG tablet Commonly known as:  HYZAAR   metFORMIN 500 MG tablet Commonly known as:  GLUCOPHAGE Take 2 in the AM and 1 in the PM   MULTIVITAMIN ADULT PO Take 1 tablet by mouth daily.   omeprazole 20 MG capsule Commonly known as:  PRILOSEC Take one capsule by mouth twice daily for 14 days   rosuvastatin 10 MG tablet Commonly known as:  CRESTOR Take 10 mg by mouth daily.       No Known Allergies  I appreciate the opportunity to take part in Molly [name]'s care. Please do not hesitate to contact me with questions.  Sincerely,   R. Edgar Frisk, MD

## 2016-04-30 NOTE — Assessment & Plan Note (Addendum)
   Continue meticulous avoidance of shellfish and have access to epinephrine autoinjector 2 pack in case of accidental ingestion.  Food allergy action plan is in place. 

## 2017-08-26 ENCOUNTER — Encounter (INDEPENDENT_AMBULATORY_CARE_PROVIDER_SITE_OTHER): Payer: Medicare HMO

## 2017-08-31 ENCOUNTER — Encounter (INDEPENDENT_AMBULATORY_CARE_PROVIDER_SITE_OTHER): Payer: Self-pay | Admitting: Family Medicine

## 2017-08-31 ENCOUNTER — Ambulatory Visit (INDEPENDENT_AMBULATORY_CARE_PROVIDER_SITE_OTHER): Payer: Medicare HMO | Admitting: Family Medicine

## 2017-08-31 VITALS — BP 146/89 | HR 94 | Temp 98.3°F | Ht 63.0 in | Wt 223.0 lb

## 2017-08-31 DIAGNOSIS — R5383 Other fatigue: Secondary | ICD-10-CM

## 2017-08-31 DIAGNOSIS — E119 Type 2 diabetes mellitus without complications: Secondary | ICD-10-CM

## 2017-08-31 DIAGNOSIS — Z1331 Encounter for screening for depression: Secondary | ICD-10-CM

## 2017-08-31 DIAGNOSIS — E559 Vitamin D deficiency, unspecified: Secondary | ICD-10-CM

## 2017-08-31 DIAGNOSIS — Z6830 Body mass index (BMI) 30.0-30.9, adult: Secondary | ICD-10-CM

## 2017-08-31 DIAGNOSIS — R0602 Shortness of breath: Secondary | ICD-10-CM

## 2017-08-31 DIAGNOSIS — I10 Essential (primary) hypertension: Secondary | ICD-10-CM | POA: Diagnosis not present

## 2017-08-31 DIAGNOSIS — E669 Obesity, unspecified: Secondary | ICD-10-CM

## 2017-08-31 DIAGNOSIS — Z0289 Encounter for other administrative examinations: Secondary | ICD-10-CM

## 2017-08-31 DIAGNOSIS — E1159 Type 2 diabetes mellitus with other circulatory complications: Secondary | ICD-10-CM | POA: Insufficient documentation

## 2017-09-01 LAB — COMPREHENSIVE METABOLIC PANEL
ALT: 16 IU/L (ref 0–32)
AST: 18 IU/L (ref 0–40)
Albumin/Globulin Ratio: 1.8 (ref 1.2–2.2)
Albumin: 4.9 g/dL — ABNORMAL HIGH (ref 3.5–4.8)
Alkaline Phosphatase: 56 IU/L (ref 39–117)
BUN/Creatinine Ratio: 10 — ABNORMAL LOW (ref 12–28)
BUN: 8 mg/dL (ref 8–27)
Bilirubin Total: 0.3 mg/dL (ref 0.0–1.2)
CALCIUM: 10.3 mg/dL (ref 8.7–10.3)
CHLORIDE: 101 mmol/L (ref 96–106)
CO2: 29 mmol/L (ref 20–29)
Creatinine, Ser: 0.82 mg/dL (ref 0.57–1.00)
GFR, EST AFRICAN AMERICAN: 83 mL/min/{1.73_m2} (ref >59)
GFR, EST NON AFRICAN AMERICAN: 72 mL/min/{1.73_m2} (ref >59)
GLUCOSE: 119 mg/dL — AB (ref 65–99)
Globulin, Total: 2.7 g/dL (ref 1.5–4.5)
Potassium: 4.3 mmol/L (ref 3.5–5.2)
Sodium: 143 mmol/L (ref 134–144)
TOTAL PROTEIN: 7.6 g/dL (ref 6.0–8.5)

## 2017-09-01 LAB — CBC WITH DIFFERENTIAL
BASOS ABS: 0 10*3/uL (ref 0.0–0.2)
Basos: 1 %
EOS (ABSOLUTE): 0.1 10*3/uL (ref 0.0–0.4)
Eos: 4 %
HEMOGLOBIN: 14 g/dL (ref 11.1–15.9)
Hematocrit: 41.2 % (ref 34.0–46.6)
IMMATURE GRANS (ABS): 0 10*3/uL (ref 0.0–0.1)
Immature Granulocytes: 0 %
LYMPHS: 53 %
Lymphocytes Absolute: 1.7 10*3/uL (ref 0.7–3.1)
MCH: 31.3 pg (ref 26.6–33.0)
MCHC: 34 g/dL (ref 31.5–35.7)
MCV: 92 fL (ref 79–97)
MONOCYTES: 7 %
Monocytes Absolute: 0.2 10*3/uL (ref 0.1–0.9)
NEUTROS PCT: 35 %
Neutrophils Absolute: 1.1 10*3/uL — ABNORMAL LOW (ref 1.4–7.0)
RBC: 4.48 x10E6/uL (ref 3.77–5.28)
RDW: 13.7 % (ref 12.3–15.4)
WBC: 3.2 10*3/uL — AB (ref 3.4–10.8)

## 2017-09-01 LAB — T3: T3 TOTAL: 97 ng/dL (ref 71–180)

## 2017-09-01 LAB — T4, FREE: Free T4: 1.24 ng/dL (ref 0.82–1.77)

## 2017-09-01 LAB — LIPID PANEL WITH LDL/HDL RATIO
Cholesterol, Total: 150 mg/dL (ref 100–199)
HDL: 66 mg/dL (ref >39)
LDL Calculated: 63 mg/dL (ref 0–99)
LDL/HDL RATIO: 1 ratio (ref 0.0–3.2)
Triglycerides: 105 mg/dL (ref 0–149)
VLDL CHOLESTEROL CAL: 21 mg/dL (ref 5–40)

## 2017-09-01 LAB — HEMOGLOBIN A1C
ESTIMATED AVERAGE GLUCOSE: 140 mg/dL
Hgb A1c MFr Bld: 6.5 % — ABNORMAL HIGH (ref 4.8–5.6)

## 2017-09-01 LAB — INSULIN, RANDOM: INSULIN: 20.4 u[IU]/mL (ref 2.6–24.9)

## 2017-09-01 LAB — TSH: TSH: 1.68 u[IU]/mL (ref 0.450–4.500)

## 2017-09-01 LAB — VITAMIN D 25 HYDROXY (VIT D DEFICIENCY, FRACTURES): VIT D 25 HYDROXY: 49.8 ng/mL (ref 30.0–100.0)

## 2017-09-01 NOTE — Progress Notes (Signed)
.  Office: [phone]  /  Fax: [phone]   HPI:   Chief Complaint: OBESITY  Molly [name] (MR# [id number]) is a 72 y.o. female who presents on 08/31/2017 for obesity evaluation and treatment. Current BMI is Body mass index is 39.5 kg/m.[name] Kortney has struggled with obesity for years and has been unsuccessful in either losing weight or maintaining long term weight loss. Sendy attended our information session and states she is currently in the action stage of change and ready to dedicate time achieving and maintaining a healthier weight.  Erdine's daughter signed her up and she recalls information about diet from our information session.   Alzora states her family eats meals together her desired weight loss is 73 lbs she has been heavy most of  her life she started gaining weight after child birth her heaviest weight ever was 230 lbs. she has significant food cravings issues  she snacks frequently in the evenings she skips meals frequently she is frequently drinking liquids with calories she has problems with excessive hunger  she frequently eats larger portions than normal  she struggles with emotional eating    Fatigue Carrieann feels her energy is lower than it should be. This has worsened with weight gain and has not worsened recently. Mirren admits to daytime somnolence and  admits to waking up still tired. Patient is at risk for obstructive sleep apnea. Patent has a history of symptoms of daytime fatigue. Patient generally gets 6 hours of sleep per night, and states they generally have generally restful sleep. Snoring is present. Apneic episodes are present. Epworth Sleepiness Score is 14.  Dyspnea on exertion Aubrianna notes increasing shortness of breath with exercising and seems to be worsening over time with weight gain. She notes getting out of breath sooner with activity than she used to. This has not gotten worse recently. EKG with T wave abnormalities that upon chart review were noted in 2012.  In 2012 Beverlee underwent stress test and echocardiogram, both of which were normal. Toniqua denies chest pain, chest pressure, palpitations, or orthopnea.  Diabetes II Lilleigh has a diagnosis of diabetes type II. Emmamarie states fasting BGs >100 and post prandial range between 130 and 140, but not >200. She takes metformin and last A1c of 6.3. She notes cravings of sweets and denies any hypoglycemic episodes. She had MAB done in September of 2018 but results not on cart everywhere (only album documented and <7).  Hypertension Gabbrielle Mcnicholas is a 72 y.o. female with hypertension. Rebeckah's blood pressure is slightly elevated today. Her blood pressure at last primary care visit was 133/78. She denies chest pain, chest pressure, palpitations, or headaches. She is on amlodipine and losartan-hydrochlorothiazide and occasionally takes lasix for lower extremity edema. She is working weight loss to help control her blood pressure with the goal of decreasing her risk of heart attack and stroke. Cianna's blood pressure is not currently controlled.  Vitamin D deficiency Oree has a diagnosis of vitamin D deficiency. She is on Vit D 5,000 IU daily. She notes fatigue and denies nausea, vomiting or muscle weakness.  Depression Screen Shaelyn's Food and Mood (modified PHQ-9) score was  Depression screen PHQ 2/9 08/31/2017  Decreased Interest 2  Down, Depressed, Hopeless 1  PHQ - 2 Score 3  Altered sleeping 1  Tired, decreased energy 2  Change in appetite 1  Feeling bad or failure about yourself  1  Trouble concentrating 2  Moving slowly or fidgety/restless 1  Suicidal thoughts 0  PHQ-9 Score  11  Difficult doing work/chores Somewhat difficult    ALLERGIES: No Known Allergies  MEDICATIONS: Current Outpatient Medications on File Prior to Visit  Medication Sig Dispense Refill  . amLODipine (NORVASC) 10 MG tablet Take 10 mg by mouth daily.     . Cholecalciferol (VITAMIN D3) 5000 units CAPS Take 1 capsule by mouth daily.    [name]  co-enzyme Q-10 30 MG capsule Take 30 mg by mouth 3 (three) times daily.    [name] escitalopram (LEXAPRO) 20 MG tablet     . fluticasone (FLONASE) 50 MCG/ACT nasal spray Place 2 sprays into both nostrils daily as needed for allergies or rhinitis. 16 g 5  . furosemide (LASIX) 20 MG tablet Take 20 mg by mouth as needed.     [name] levocetirizine (XYZAL) 5 MG tablet Take 1 tablet (5 mg total) by mouth every evening. 30 tablet 5  . losartan-hydrochlorothiazide (HYZAAR) 100-25 MG tablet     . metFORMIN (GLUCOPHAGE) 500 MG tablet Take 2 in the AM and 1 in the PM    . Multiple Vitamins-Minerals (MULTIVITAMIN ADULT PO) Take 1 tablet by mouth daily.    . Omega-3 Fatty Acids (FISH OIL PO) Take 1 tablet by mouth daily.    . rosuvastatin (CRESTOR) 10 MG tablet Take 10 mg by mouth daily.      No current facility-administered medications on file prior to visit.     PAST MEDICAL HISTORY: Past Medical History:  Diagnosis Date  . Anxiety   . Constipation   . Depression   . Diabetes type 2, controlled ([place])   . Diarrhea   . Fatigue   . Frequent urination   . GERD (gastroesophageal reflux disease)   . Hyperlipidemia   . Hypertension   . Kidney disease   . Leg cramps   . Multiple food allergies   . Muscle stiffness   . Seasonal allergies   . Shortness of breath   . Sinus pain   . Swallowing difficulty   . Swelling of extremity   . Wheezing     PAST SURGICAL HISTORY: Past Surgical History:  Procedure Laterality Date  . CHOLECYSTECTOMY  1999  . FOOT SURGERY    . MASTECTOMY      SOCIAL HISTORY: Social History   Tobacco Use  . Smoking status: Former Smoker    Types: Cigarettes    Last attempt to quit: 03/23/1998    Years since quitting: 19.4  . Smokeless tobacco: Never Used  Substance Use Topics  . Alcohol use: Yes    Comment: occasional  . Drug use: No    FAMILY HISTORY: Family History  Problem Relation Age of Onset  . Allergic rhinitis Sister   . Eczema Grandchild   . Eczema Daughter    . Urticaria Daughter   . Lupus Other   . Hypertension Mother   . Stroke Mother   . Obesity Mother   . Hypertension Father   . Heart disease Father   . Sudden death Father   . Asthma Neg Hx   . Atopy Neg Hx   . Angioedema Neg Hx     ROS: Review of Systems  Constitutional: Positive for malaise/fatigue. Negative for weight loss.  HENT: Positive for sinus pain.        Nasal stuffiness Dentures  Dry mouth Mouth sores  Eyes: Positive for redness.       Vision changes Wear glasses or contacts  Respiratory: Positive for cough, shortness of breath (with exertion) and wheezing.  Cardiovascular: Negative for chest pain, palpitations and orthopnea.       Difficulty breathing while lying down Calf/leg pain with walking Leg cramping Very cold feet or hands Negative chest pressure  Gastrointestinal: Positive for constipation, diarrhea and heartburn. Negative for nausea and vomiting.       Swallowing difficulty  Genitourinary: Positive for frequency.  Musculoskeletal:       Neck stiffness Muscle or joint pain Muscle stiffness Red or swollen joints Negative muscle weakness  Skin: Positive for itching.       Dryness  Hair or nail changes  Neurological: Negative for headaches.  Psychiatric/Behavioral: Positive for depression. Negative for suicidal ideas.    PHYSICAL EXAM: Blood pressure (!) 146/89, pulse 94, temperature 98.3 F (36.8 C), temperature source Oral, height 5\' 3"  (1.6 m), weight 223 lb (101.2 kg), SpO2 99 %. Body mass index is 39.5 kg/m. Physical Exam  Constitutional: She is oriented to person, place, and time. She appears well-developed and well-nourished.  HENT:  Head: Normocephalic and atraumatic.  Nose: Nose normal.  Eyes: EOM are normal. No scleral icterus.  Neck: Normal range of motion. Neck supple. No thyromegaly present.  Cardiovascular: Normal rate and regular rhythm.  Pulmonary/Chest: Effort normal. No respiratory distress.  Abdominal: Soft. There  is no tenderness.  + Obesity  Musculoskeletal:  Range of Motion normal in all 4 extremities Trace edema noted in bilateral lower extremities  Neurological: She is alert and oriented to person, place, and time. Coordination normal.  Skin: Skin is warm and dry.  Psychiatric: She has a normal mood and affect. Her behavior is normal.  Vitals reviewed.   RECENT LABS AND TESTS: BMET    Component Value Date/Time   NA 143 08/31/2017 1140   K 4.3 08/31/2017 1140   CL 101 08/31/2017 1140   CO2 29 08/31/2017 1140   GLUCOSE 119 (H) 08/31/2017 1140   GLUCOSE 108 (H) 03/23/2016 1116   BUN 8 08/31/2017 1140   CREATININE 0.82 08/31/2017 1140   CREATININE 0.90 03/23/2016 1116   CALCIUM 10.3 08/31/2017 1140   GFRNONAA 72 08/31/2017 1140   GFRAA 83 08/31/2017 1140   Lab Results  Component Value Date   HGBA1C 6.5 (H) 08/31/2017   Lab Results  Component Value Date   INSULIN 20.4 08/31/2017   CBC    Component Value Date/Time   WBC 3.2 (L) 08/31/2017 1140   WBC 3.6 (L) 03/23/2016 1116   RBC 4.48 08/31/2017 1140   RBC 4.66 03/23/2016 1116   HGB 14.0 08/31/2017 1140   HCT 41.2 08/31/2017 1140   PLT 185 03/23/2016 1116   MCV 92 08/31/2017 1140   MCH 31.3 08/31/2017 1140   MCH 30.7 03/23/2016 1116   MCHC 34.0 08/31/2017 1140   MCHC 32.7 03/23/2016 1116   RDW 13.7 08/31/2017 1140   LYMPHSABS 1.7 08/31/2017 1140   MONOABS 324 03/23/2016 1116   EOSABS 0.1 08/31/2017 1140   BASOSABS 0.0 08/31/2017 1140   Iron/TIBC/Ferritin/ %Sat No results found for: IRON, TIBC, FERRITIN, IRONPCTSAT Lipid Panel     Component Value Date/Time   CHOL 150 08/31/2017 1140   TRIG 105 08/31/2017 1140   HDL 66 08/31/2017 1140   CHOLHDL 2.6 08/31/2010 0540   VLDL 12 08/31/2010 0540   LDLCALC 63 08/31/2017 1140   Hepatic Function Panel     Component Value Date/Time   PROT 7.6 08/31/2017 1140   ALBUMIN 4.9 (H) 08/31/2017 1140   AST 18 08/31/2017 1140   ALT 16 08/31/2017 1140  ALKPHOS 56 08/31/2017  1140   BILITOT 0.3 08/31/2017 1140      Component Value Date/Time   TSH 1.680 08/31/2017 1140   Vitamin D No recent labs  ECG  shows NSR with a rate of 85 BPM INDIRECT CALORIMETER done today shows a VO2 of 166 and a REE of 1154. Her calculated basal metabolic rate is 6962 thus her basal metabolic rate is worse than expected.    ASSESSMENT AND PLAN: Other fatigue - Plan: EKG 12-Lead, CBC With Differential, Lipid Panel With LDL/HDL Ratio, T3, T4, free, TSH  Shortness of breath on exertion - Plan: CBC With Differential, Lipid Panel With LDL/HDL Ratio  Type 2 diabetes mellitus without complication, without long-term current use of insulin (HCC) - Plan: Comprehensive metabolic panel, Hemoglobin A1c, Insulin, random  Essential hypertension  Vitamin D deficiency - Plan: VITAMIN D 25 Hydroxy (Vit-D Deficiency, Fractures)  Depression screening  Class 1 obesity with serious comorbidity and body mass index (BMI) of 30.0 to 30.9 in adult, unspecified obesity type  PLAN:  Fatigue Rodnesha was informed that her fatigue may be related to obesity, depression or many other causes. We will check CBC, CMP, and Vit D, and in the meanwhile Teneil has agreed to work on diet, exercise and weight loss to help with fatigue. Proper sleep hygiene was discussed including the need for 7-8 hours of quality sleep each night. A sleep study was not ordered based on symptoms and Epworth score.  Dyspnea on exertion Kyliyah's shortness of breath appears to be obesity related and exercise induced. She has agreed to work on weight loss and gradually increase exercise to treat her exercise induced shortness of breath. If Delitha follows our instructions and loses weight without improvement of her shortness of breath, we will plan to refer to pulmonology. We will monitor this condition regularly, EKG, and IC. Jalana agrees to this plan.  Diabetes II Bryce has been given extensive diabetes education by myself today including ideal  fasting and post-prandial blood glucose readings, individual ideal Hgb A1c goals and hypoglycemia prevention. We discussed the importance of good blood sugar control to decrease the likelihood of diabetic complications such as nephropathy, neuropathy, limb loss, blindness, coronary artery disease, and death. We discussed the importance of intensive lifestyle modification including diet, exercise and weight loss as the first line treatment for diabetes. We will check Hgb A1c and insulin level today, Kerith agrees to continue metformin and will continue to check her BGs as she has been. Jahnai agrees to follow up with our clinic in 2 weeks.  Hypertension We discussed sodium restriction, working on healthy weight loss, and a regular exercise program as the means to achieve improved blood pressure control. Meha agreed with this plan and agreed to follow up as directed. We will continue to monitor her blood pressure as well as her progress with the above lifestyle modifications. She will continue her medications as prescribed and will watch for signs of hypotension as she continues her lifestyle modifications, and we will monitor at next visit. Shakeeta agrees to follow up with our clinic in 2 weeks.  Vitamin D Deficiency Hien was informed that low vitamin D levels contributes to fatigue and are associated with obesity, breast, and colon cancer. Micca agrees to continue taking Vit D 5,000 IU daily and will follow up for routine testing of vitamin D, at least 2-3 times per year. She was informed of the risk of over-replacement of vitamin D and agrees to not increase her dose unless  she discusses this with [name] first. We will check Vit D level today and Twisha agrees to follow up with our clinic in 2 weeks.  Depression Screen Lynnsey had a moderately positive depression screening. Depression is commonly associated with obesity and often results in emotional eating behaviors. We will monitor this closely and work on CBT to help  improve the non-hunger eating patterns. Referral to Psychology may be required if no improvement is seen as she continues in our clinic.  Obesity Gene is currently in the action stage of change and her goal is to continue with weight loss efforts She has agreed to follow the Category 1 plan Sharone has been instructed to work up to a goal of 150 minutes of combined cardio and strengthening exercise per week for weight loss and overall health benefits. We discussed the following Behavioral Modification Strategies today: increasing lean protein intake, work on meal planning and easy cooking plans and decrease liquid calories  Milania has agreed to follow up with our clinic in 2 weeks. She was informed of the importance of frequent follow up visits to maximize her success with intensive lifestyle modifications for her multiple health conditions. She was informed we would discuss her lab results at her next visit unless there is a critical issue that needs to be addressed sooner. Denette agreed to keep her next visit at the agreed upon time to discuss these results.    OBESITY BEHAVIORAL INTERVENTION VISIT  Today's visit was # 1 out of 22.  Starting weight: 223 lbs Starting date: 08/31/17 Today's weight : 223 lbs  Today's date: 08/31/2017 Total lbs lost to date: 0 (Patients must lose 7 lbs in the first 6 months to continue with counseling)   ASK: We discussed the diagnosis of obesity with Kerry Hough today and Calla agreed to give [name] permission to discuss obesity behavioral modification therapy today.  ASSESS: Latoi has the diagnosis of obesity and her BMI today is 39.51 Hermione is in the action stage of change   ADVISE: Nathasha was educated on the multiple health risks of obesity as well as the benefit of weight loss to improve her health. She was advised of the need for long term treatment and the importance of lifestyle modifications.  AGREE: Multiple dietary modification options and treatment options  were discussed and  Jenette agreed to the above obesity treatment plan.   I, Trixie Dredge, am acting as transcriptionist for Dennard Nip, MD   I have reviewed the above documentation for accuracy and completeness, and I agree with the above. -Dennard Nip, MD

## 2017-09-12 ENCOUNTER — Encounter (HOSPITAL_BASED_OUTPATIENT_CLINIC_OR_DEPARTMENT_OTHER): Payer: Self-pay | Admitting: Emergency Medicine

## 2017-09-12 ENCOUNTER — Other Ambulatory Visit: Payer: Self-pay

## 2017-09-12 ENCOUNTER — Emergency Department (HOSPITAL_BASED_OUTPATIENT_CLINIC_OR_DEPARTMENT_OTHER)
Admission: EM | Admit: 2017-09-12 | Discharge: 2017-09-12 | Disposition: A | Discharge status: Home / Self Care | Payer: Medicare HMO | Attending: Emergency Medicine | Admitting: Emergency Medicine

## 2017-09-12 DIAGNOSIS — Z87891 Personal history of nicotine dependence: Secondary | ICD-10-CM | POA: Diagnosis not present

## 2017-09-12 DIAGNOSIS — R42 Dizziness and giddiness: Secondary | ICD-10-CM

## 2017-09-12 DIAGNOSIS — Z7984 Long term (current) use of oral hypoglycemic drugs: Secondary | ICD-10-CM | POA: Diagnosis not present

## 2017-09-12 DIAGNOSIS — E119 Type 2 diabetes mellitus without complications: Secondary | ICD-10-CM | POA: Diagnosis not present

## 2017-09-12 DIAGNOSIS — I10 Essential (primary) hypertension: Secondary | ICD-10-CM | POA: Diagnosis not present

## 2017-09-12 LAB — TROPONIN I: Troponin I: <0.03 ng/mL (ref <0.03)

## 2017-09-12 LAB — URINALYSIS, ROUTINE W REFLEX MICROSCOPIC
BILIRUBIN URINE: NEGATIVE
GLUCOSE, UA: NEGATIVE mg/dL
HGB URINE DIPSTICK: NEGATIVE
KETONES UR: 15 mg/dL — AB
Leukocytes, UA: NEGATIVE
Nitrite: NEGATIVE
PH: 7 (ref 5.0–8.0)
PROTEIN: 30 mg/dL — AB
Specific Gravity, Urine: 1.02 (ref 1.005–1.030)

## 2017-09-12 LAB — CBC
HCT: 43.7 % (ref 36.0–46.0)
HEMOGLOBIN: 15 g/dL (ref 12.0–15.0)
MCH: 31.8 pg (ref 26.0–34.0)
MCHC: 34.3 g/dL (ref 30.0–36.0)
MCV: 92.6 fL (ref 78.0–100.0)
PLATELETS: 173 10*3/uL (ref 150–400)
RBC: 4.72 MIL/uL (ref 3.87–5.11)
RDW: 13.1 % (ref 11.5–15.5)
WBC: 4.4 10*3/uL (ref 4.0–10.5)

## 2017-09-12 LAB — BASIC METABOLIC PANEL
ANION GAP: 11 (ref 5–15)
BUN: 14 mg/dL (ref 6–20)
CALCIUM: 10.3 mg/dL (ref 8.9–10.3)
CO2: 27 mmol/L (ref 22–32)
Chloride: 100 mmol/L — ABNORMAL LOW (ref 101–111)
Creatinine, Ser: 0.94 mg/dL (ref 0.44–1.00)
GFR calc Af Amer: >60 mL/min (ref >60)
GFR, EST NON AFRICAN AMERICAN: 60 mL/min — AB (ref >60)
Glucose, Bld: 134 mg/dL — ABNORMAL HIGH (ref 65–99)
Potassium: 3.4 mmol/L — ABNORMAL LOW (ref 3.5–5.1)
Sodium: 138 mmol/L (ref 135–145)

## 2017-09-12 LAB — URINALYSIS, MICROSCOPIC (REFLEX): RBC / HPF: NONE SEEN RBC/hpf (ref 0–5)

## 2017-09-12 LAB — CBG MONITORING, ED: Glucose-Capillary: 104 mg/dL — ABNORMAL HIGH (ref 65–99)

## 2017-09-12 MED ORDER — DIAZEPAM 5 MG/ML IJ SOLN
2.5000 mg | Freq: Once | INTRAMUSCULAR | Status: AC
  Administered 2017-09-12: 2.5 mg via INTRAVENOUS
  Filled 2017-09-12: qty 2

## 2017-09-12 MED ORDER — SODIUM CHLORIDE 0.9 % IV BOLUS (SEPSIS)
1000.0000 mL | Freq: Once | INTRAVENOUS | Status: AC
  Administered 2017-09-12: 1000 mL via INTRAVENOUS

## 2017-09-12 MED ORDER — MECLIZINE HCL 25 MG PO TABS
25.0000 mg | ORAL_TABLET | Freq: Once | ORAL | Status: DC
  Filled 2017-09-12: qty 1

## 2017-09-12 MED ORDER — ONDANSETRON HCL 4 MG/2ML IJ SOLN
4.0000 mg | Freq: Once | INTRAMUSCULAR | Status: AC
  Administered 2017-09-12: 4 mg via INTRAVENOUS
  Filled 2017-09-12: qty 2

## 2017-09-12 MED ORDER — DIAZEPAM 5 MG PO TABS: 2.5000 mg | ORAL_TABLET | Freq: Three times a day (TID) | ORAL | 0 refills | Status: DC | PRN

## 2017-09-12 MED ORDER — ONDANSETRON 4 MG PO TBDP: 4.0000 mg | ORAL_TABLET | Freq: Three times a day (TID) | ORAL | 0 refills | Status: DC | PRN

## 2017-09-12 NOTE — ED Triage Notes (Signed)
Patient states that she is having dizziness and nausea starting about 2 days ago and with nausea. The patient states that she is having right sided ear pain as well

## 2017-09-12 NOTE — ED Provider Notes (Signed)
[facility] [place] [facility] EMERGENCY DEPARTMENT Provider Note   CSN: [id number] Arrival date & time: 09/12/17  2030     History   Chief Complaint Chief Complaint  Patient presents with  . Dizziness    HPI Cayli Escajeda is a 72 y.o. female.  HPI  72 year old female with a history of type 2 diabetes, hypertension, hyperlipidemia presents with dizziness and nausea/vomiting.  The dizziness is been ongoing for the last 2 or more days.  It mostly comes and goes whenever she sits stands up.  When she first stands up it is an extreme amount of dizziness and then seems to go away after a few.  Then there is residual dizziness that goes away.  Of his last 24 hours she has had multiple episodes of emesis, usually whenever the dizziness strikes.  There is no headache or focal weakness/numbness.  She feels dizzy when getting up and walking but she is able to walk a straight line and stumbling.  She has had some right ear fullness during this time.  She thinks she had a cold about a week ago and was taking some cold medicine.  The right ear fullness has been a coming and going thing.  She has had ringing in it but not for several months. No hearing loss.  Past Medical History:  Diagnosis Date  . Anxiety   . Constipation   . Depression   . Diabetes type 2, controlled ([place])   . Diarrhea   . Fatigue   . Frequent urination   . GERD (gastroesophageal reflux disease)   . Hyperlipidemia   . Hypertension   . Kidney disease   . Leg cramps   . Multiple food allergies   . Muscle stiffness   . Seasonal allergies   . Shortness of breath   . Sinus pain   . Swallowing difficulty   . Swelling of extremity   . Wheezing     Patient Active Problem List   Diagnosis Date Noted  . Other fatigue 08/31/2017  . Shortness of breath on exertion 08/31/2017  . Type 2 diabetes mellitus without complication, without long-term current use of insulin ([place]) 08/31/2017  . Essential hypertension 08/31/2017  . Vitamin D  deficiency 08/31/2017  . Recurrent urticaria 03/23/2016  . Angioedema 03/23/2016  . Food allergy 03/23/2016  . Other allergic rhinitis 03/23/2016    Past Surgical History:  Procedure Laterality Date  . CHOLECYSTECTOMY  1999  . FOOT SURGERY    . MASTECTOMY      OB History    Gravida Para Term Preterm AB Living   3 3 3     3    SAB TAB Ectopic Multiple Live Births                   Home Medications    Prior to Admission medications   Medication Sig Start Date End Date Taking? Authorizing Provider  amLODipine (NORVASC) 10 MG tablet Take 10 mg by mouth daily.  12/31/15   [provider]  Cholecalciferol (VITAMIN D3) 5000 units CAPS Take 1 capsule by mouth daily.    [provider]  co-enzyme Q-10 30 MG capsule Take 30 mg by mouth 3 (three) times daily.    [provider]  diazepam (VALIUM) 5 MG tablet Take 0.5 tablets (2.5 mg total) by mouth every 8 (eight) hours as needed (vertigo/dizziness). 09/12/17   Sherwood Gambler, MD  escitalopram (LEXAPRO) 20 MG tablet  03/29/16   [provider]  fluticasone Asencion Islam)  50 MCG/ACT nasal spray Place 2 sprays into both nostrils daily as needed for allergies or rhinitis. 03/23/16   Bobbitt, [place] Muta, MD  furosemide (LASIX) 20 MG tablet Take 20 mg by mouth as needed.  12/31/15   [provider]  levocetirizine (XYZAL) 5 MG tablet Take 1 tablet (5 mg total) by mouth every evening. 03/23/16   Bobbitt, [place] Muta, MD  losartan-hydrochlorothiazide [facility]) 100-25 MG tablet  12/31/15   [provider]  metFORMIN (GLUCOPHAGE) 500 MG tablet Take 2 in the AM and 1 in the PM 02/26/16   [provider]  Multiple Vitamins-Minerals (MULTIVITAMIN ADULT PO) Take 1 tablet by mouth daily.    [provider]  Omega-3 Fatty Acids (FISH OIL PO) Take 1 tablet by mouth daily.    [provider]  ondansetron (ZOFRAN ODT) 4 MG disintegrating tablet Take 1 tablet (4 mg total) by mouth every 8  (eight) hours as needed for nausea or vomiting. 09/12/17   Sherwood Gambler, MD  rosuvastatin (CRESTOR) 10 MG tablet Take 10 mg by mouth daily.  01/05/16   [provider]    Family History Family History  Problem Relation Age of Onset  . Allergic rhinitis Sister   . Eczema Grandchild   . Eczema Daughter   . Urticaria Daughter   . Lupus Other   . Hypertension Mother   . Stroke Mother   . Obesity Mother   . Hypertension Father   . Heart disease Father   . Sudden death Father   . Asthma Neg Hx   . Atopy Neg Hx   . Angioedema Neg Hx     Social History Social History   Tobacco Use  . Smoking status: Former Smoker    Types: Cigarettes    Last attempt to quit: 03/23/1998    Years since quitting: 19.4  . Smokeless tobacco: Never Used  Substance Use Topics  . Alcohol use: Yes    Comment: occasional  . Drug use: No     Allergies   Patient has no known allergies.   Review of Systems Review of Systems  Constitutional: Negative for fever.  HENT:       Fullness, no pain  Eyes: Negative for visual disturbance.  Respiratory: Negative for shortness of breath.   Cardiovascular: Negative for chest pain.  Gastrointestinal: Positive for nausea and vomiting.  Neurological: Positive for dizziness. Negative for weakness, numbness and headaches.  All other systems reviewed and are negative.    Physical Exam Updated Vital Signs BP 130/77   Pulse 74   Temp 98.5 F (36.9 C) (Oral)   Resp (!) 21   Ht 5\' 3"  (1.6 m)   Wt 101.2 kg (223 lb)   SpO2 94%   BMI 39.50 kg/m   Physical Exam  Constitutional: She is oriented to person, place, and time. She appears well-developed and well-nourished.  HENT:  Head: Normocephalic and atraumatic.  Right Ear: Tympanic membrane and external ear normal.  Left Ear: Tympanic membrane and external ear normal.  Nose: Nose normal.  Eyes: EOM are normal. Pupils are equal, round, and reactive to light. Right eye exhibits no discharge. Left  eye exhibits no discharge. Right eye exhibits no nystagmus. Left eye exhibits no nystagmus.  Cardiovascular: Normal rate, regular rhythm and normal heart sounds.  Pulmonary/Chest: Effort normal and breath sounds normal.  Abdominal: Soft. There is no tenderness.  Neurological: She is alert and oriented to person, place, and time.  CN 3-12 grossly intact. 5/5 strength  in all 4 extremities. Grossly normal sensation. Normal finger to nose. Normal gait  Skin: Skin is warm and dry.  Nursing note and vitals reviewed.    ED Treatments / Results  Labs (all labs ordered are listed, but only abnormal results are displayed) Labs Reviewed  BASIC METABOLIC PANEL - Abnormal; Notable for the following components:      Result Value   Potassium 3.4 (*)    Chloride 100 (*)    Glucose, Bld 134 (*)    GFR calc non Af Amer 60 (*)    All other components within normal limits  URINALYSIS, ROUTINE W REFLEX MICROSCOPIC - Abnormal; Notable for the following components:   Ketones, ur 15 (*)    Protein, ur 30 (*)    All other components within normal limits  URINALYSIS, MICROSCOPIC (REFLEX) - Abnormal; Notable for the following components:   Bacteria, UA FEW (*)    Squamous Epithelial / LPF 0-5 (*)    All other components within normal limits  CBG MONITORING, ED - Abnormal; Notable for the following components:   Glucose-Capillary 104 (*)    All other components within normal limits  CBC  TROPONIN I    EKG  EKG Interpretation  Date/Time:  Sunday September 12 2017 21:06:12 EST Ventricular Rate:  84 PR Interval:    QRS Duration: 103 QT Interval:  360 QTC Calculation: 426 R Axis:   -31 Text Interpretation:  Sinus rhythm Incomplete RBBB and LAFB nonspecific T waves similar to 2012 Confirmed by Kataya Guimont ([id number]) on 09/12/2017 9:23:40 PM       Radiology No results found.  Procedures Procedures (including critical care time)  Medications Ordered in ED Medications  ondansetron (ZOFRAN)  injection 4 mg (4 mg Intravenous Given 09/12/17 2118)  sodium chloride 0.9 % bolus 1,000 mL (0 mLs Intravenous Stopped 09/12/17 2314)  diazepam (VALIUM) injection 2.5 mg (2.5 mg Intravenous Given 09/12/17 2153)     Initial Impression / Assessment and Plan / ED Course  I have reviewed the triage vital signs and the nursing notes.  Pertinent labs & imaging results that were available during my care of the patient were reviewed by me and considered in my medical decision making (see chart for details).     Much better after IV Valium.  The dizziness is essentially resolved although is mildly present.  She initially was tried with oral medicine but had some vomiting.  However now the vomiting has stopped as well.  I think this is consistent with peripheral vertigo.  She has no other concerning signs or symptoms such as weakness, numbness, blurry vision, or gait instability.  My suspicion for stroke is low.  She tells me she has had vertigo multiple times before and feels like this.  I do not see any obvious ear pathology despite her ear fullness sensation and she will be referred to ENT given the acute on chronic nature of this.  Discharge home with treatment and return precautions.  We discussed possible side effects of Valium.  Final Clinical Impressions(s) / ED Diagnoses   Final diagnoses:  Vertigo    ED Discharge Orders        Ordered    diazepam (VALIUM) 5 MG tablet  Every 8 hours PRN     09/12/17 2253    ondansetron (ZOFRAN ODT) 4 MG disintegrating tablet  Every 8 hours PRN     02 /17/19 2253       Sherwood Gambler, MD 09/12/17 2359

## 2017-09-12 NOTE — ED Notes (Signed)
Pt given cup to attempt urine sample. 

## 2017-09-14 ENCOUNTER — Ambulatory Visit (INDEPENDENT_AMBULATORY_CARE_PROVIDER_SITE_OTHER): Payer: Medicare HMO | Admitting: Family Medicine

## 2017-09-14 VITALS — BP 135/79 | HR 87 | Temp 98.0°F | Ht 63.0 in | Wt 220.0 lb

## 2017-09-14 DIAGNOSIS — E119 Type 2 diabetes mellitus without complications: Secondary | ICD-10-CM

## 2017-09-14 DIAGNOSIS — I10 Essential (primary) hypertension: Secondary | ICD-10-CM

## 2017-09-14 DIAGNOSIS — Z6839 Body mass index (BMI) 39.0-39.9, adult: Secondary | ICD-10-CM | POA: Diagnosis not present

## 2017-09-14 DIAGNOSIS — E559 Vitamin D deficiency, unspecified: Secondary | ICD-10-CM

## 2017-09-14 NOTE — Progress Notes (Signed)
Office: [phone]  /  Fax: [phone]   HPI:   Chief Complaint: OBESITY Molly [name] is here to discuss her progress with her obesity treatment plan. She is on the  follow the Category 1 plan and is following her eating plan approximately 95 % of the time. She states she is not exercising. Molly [name] went to the ED over the weekend for vertigo. She didn't struggle with amount of food, but thinks she may not have gotten enough meat at dinner. She states she feels full even with less meat.  Her weight is 220 lb (99.8 kg) today and has had a weight loss of 3 pounds over a period of 2 weeks since her last visit. She has lost 3 lbs since starting treatment with [name].  Diabetes II Molly [name] has a diagnosis of diabetes type II. Molly [name] states fasting BGs range between 111 and 130 and 1-2 hr post prandial 92-151 (only 1 in the 150). She denies any hypoglycemic episodes. Last A1c was 6.5. She has been working on intensive lifestyle modifications including diet, exercise, and weight loss to help control her blood glucose levels.  Vitamin D deficiency Molly [name] has a diagnosis of vitamin D deficiency. Vit D level of 49. She is currently taking vit D OTC supplement, and denies nausea, vomiting or muscle weakness.    Ref. Range 08/31/2017 11:40  Vitamin D, 25-Hydroxy Latest Ref Range: 30.0 - 100.0 ng/mL 49.8   Hypertension Molly [name] is a 72 y.o. female with hypertension.  Molly [name] denies chest pain/pressure, shortness of breath on exertion, or headache. She is working weight loss to help control her blood pressure with the goal of decreasing her risk of heart attack and stroke. [name] blood pressure is currently controlled.   ALLERGIES: No Known Allergies  MEDICATIONS: Current Outpatient Medications on File Prior to Visit  Medication Sig Dispense Refill  . amLODipine (NORVASC) 10 MG tablet Take 10 mg by mouth daily.     . Cholecalciferol (VITAMIN D3) 5000 units CAPS Take 1 capsule by mouth daily.    [name] co-enzyme Q-10  30 MG capsule Take 30 mg by mouth 3 (three) times daily.    . diazepam (VALIUM) 5 MG tablet Take 0.5 tablets (2.5 mg total) by mouth every 8 (eight) hours as needed (vertigo/dizziness). 10 tablet 0  . escitalopram (LEXAPRO) 20 MG tablet     . fluticasone (FLONASE) 50 MCG/ACT nasal spray Place 2 sprays into both nostrils daily as needed for allergies or rhinitis. 16 g 5  . furosemide (LASIX) 20 MG tablet Take 20 mg by mouth as needed.     [name] levocetirizine (XYZAL) 5 MG tablet Take 1 tablet (5 mg total) by mouth every evening. 30 tablet 5  . losartan-hydrochlorothiazide (HYZAAR) 100-25 MG tablet     . metFORMIN (GLUCOPHAGE) 500 MG tablet Take 2 in the AM and 1 in the PM    . Multiple Vitamins-Minerals (MULTIVITAMIN ADULT PO) Take 1 tablet by mouth daily.    . Omega-3 Fatty Acids (FISH OIL PO) Take 1 tablet by mouth daily.    . ondansetron (ZOFRAN ODT) 4 MG disintegrating tablet Take 1 tablet (4 mg total) by mouth every 8 (eight) hours as needed for nausea or vomiting. 10 tablet 0  . rosuvastatin (CRESTOR) 10 MG tablet Take 10 mg by mouth daily.      No current facility-administered medications on file prior to visit.     PAST MEDICAL HISTORY: Past Medical History:  Diagnosis Date  . Anxiety   .  Constipation   . Depression   . Diabetes type 2, controlled ([place])   . Diarrhea   . Fatigue   . Frequent urination   . GERD (gastroesophageal reflux disease)   . Hyperlipidemia   . Hypertension   . Kidney disease   . Leg cramps   . Multiple food allergies   . Muscle stiffness   . Seasonal allergies   . Shortness of breath   . Sinus pain   . Swallowing difficulty   . Swelling of extremity   . Wheezing     PAST SURGICAL HISTORY: Past Surgical History:  Procedure Laterality Date  . CHOLECYSTECTOMY  1999  . FOOT SURGERY    . MASTECTOMY      SOCIAL HISTORY: Social History   Tobacco Use  . Smoking status: Former Smoker    Types: Cigarettes    Last attempt to quit: 03/23/1998     Years since quitting: 19.4  . Smokeless tobacco: Never Used  Substance Use Topics  . Alcohol use: Yes    Comment: occasional  . Drug use: No    FAMILY HISTORY: Family History  Problem Relation Age of Onset  . Allergic rhinitis Sister   . Eczema Grandchild   . Eczema Daughter   . Urticaria Daughter   . Lupus Other   . Hypertension Mother   . Stroke Mother   . Obesity Mother   . Hypertension Father   . Heart disease Father   . Sudden death Father   . Asthma Neg Hx   . Atopy Neg Hx   . Angioedema Neg Hx     ROS: Review of Systems  Constitutional: Positive for weight loss.  Respiratory: Negative for shortness of breath.   Cardiovascular: Negative for chest pain.  Gastrointestinal: Negative for nausea and vomiting.  Musculoskeletal:       Negative for muscle weakness  Neurological: Negative for headaches.  Endo/Heme/Allergies:       Negative for hypoglycemia    PHYSICAL EXAM: Blood pressure 135/79, pulse 87, temperature 98 F (36.7 C), temperature source Oral, height 5\' 3"  (1.6 m), weight 220 lb (99.8 kg), SpO2 96 %. Body mass index is 38.97 kg/m. Physical Exam  Constitutional: She is oriented to person, place, and time. She appears well-developed and well-nourished.  HENT:  Head: Normocephalic.  Eyes: EOM are normal.  Neck: Normal range of motion.  Cardiovascular: Normal rate.  Pulmonary/Chest: Effort normal.  Musculoskeletal: Normal range of motion.  Neurological: She is alert and oriented to person, place, and time.  Skin: Skin is warm and dry.  Psychiatric: She has a normal mood and affect. Her behavior is normal.  Vitals reviewed.   RECENT LABS AND TESTS: BMET    Component Value Date/Time   NA 138 09/12/2017 2118   NA 143 08/31/2017 1140   K 3.4 (L) 09/12/2017 2118   CL 100 (L) 09/12/2017 2118   CO2 27 09/12/2017 2118   GLUCOSE 134 (H) 09/12/2017 2118   BUN 14 09/12/2017 2118   BUN 8 08/31/2017 1140   CREATININE 0.94 09/12/2017 2118    CREATININE 0.90 03/23/2016 1116   CALCIUM 10.3 09/12/2017 2118   GFRNONAA 60 (L) 09/12/2017 2118   GFRAA >60 09/12/2017 2118   Lab Results  Component Value Date   HGBA1C 6.5 (H) 08/31/2017   HGBA1C (H) 08/31/2010    6.0 (NOTE)  According to the ADA Clinical Practice Recommendations for 2011, when HbA1c is used as a screening test:   >=6.5%   Diagnostic of Diabetes Mellitus           (if abnormal result  is confirmed)  5.7-6.4%   Increased risk of developing Diabetes Mellitus  References:Diagnosis and Classification of Diabetes Mellitus,Diabetes ONGE,9528,41(LKGMW 1):S62-S69 and Standards of Medical Care in         Diabetes - 2011,Diabetes NUUV,2536,64  (Suppl 1):S11-S61.   Lab Results  Component Value Date   INSULIN 20.4 08/31/2017   CBC    Component Value Date/Time   WBC 4.4 09/12/2017 2118   RBC 4.72 09/12/2017 2118   HGB 15.0 09/12/2017 2118   HGB 14.0 08/31/2017 1140   HCT 43.7 09/12/2017 2118   HCT 41.2 08/31/2017 1140   PLT 173 09/12/2017 2118   MCV 92.6 09/12/2017 2118   MCV 92 08/31/2017 1140   MCH 31.8 09/12/2017 2118   MCHC 34.3 09/12/2017 2118   RDW 13.1 09/12/2017 2118   RDW 13.7 08/31/2017 1140   LYMPHSABS 1.7 08/31/2017 1140   MONOABS 324 03/23/2016 1116   EOSABS 0.1 08/31/2017 1140   BASOSABS 0.0 08/31/2017 1140   Iron/TIBC/Ferritin/ %Sat No results found for: IRON, TIBC, FERRITIN, IRONPCTSAT Lipid Panel     Component Value Date/Time   CHOL 150 08/31/2017 1140   TRIG 105 08/31/2017 1140   HDL 66 08/31/2017 1140   CHOLHDL 2.6 08/31/2010 0540   VLDL 12 08/31/2010 0540   LDLCALC 63 08/31/2017 1140   Hepatic Function Panel     Component Value Date/Time   PROT 7.6 08/31/2017 1140   ALBUMIN 4.9 (H) 08/31/2017 1140   AST 18 08/31/2017 1140   ALT 16 08/31/2017 1140   ALKPHOS 56 08/31/2017 1140   BILITOT 0.3 08/31/2017 1140      Component Value Date/Time   TSH 1.680 08/31/2017  1140   TSH 1.57 03/23/2016 1116     Ref. Range 08/31/2017 11:40  Vitamin D, 25-Hydroxy Latest Ref Range: 30.0 - 100.0 ng/mL 49.8   ASSESSMENT AND PLAN: Type 2 diabetes mellitus without complication, without long-term current use of insulin ([facility])  Vitamin D deficiency  Essential hypertension  Class 2 severe obesity with serious comorbidity and body mass index (BMI) of 39.0 to 39.9 in adult, unspecified obesity type ([place])  PLAN: Diabetes II Shalie has been given extensive diabetes education by myself today including ideal fasting and post-prandial blood glucose readings, individual ideal HgA1c goals  and hypoglycemia prevention. We discussed the importance of good blood sugar control to decrease the likelihood of diabetic complications such as nephropathy, neuropathy, limb loss, blindness, coronary artery disease, and death. We discussed the importance of intensive lifestyle modification including diet, exercise and weight loss as the first line treatment for diabetes. Marvena agrees to continue her diabetes medications metformin 1000 mg qAM and 500 mg qPM and will follow up at the agreed upon time.  Vitamin D Deficiency Molly [name] was informed that low vitamin D levels contributes to fatigue and are associated with obesity, breast, and colon cancer. She agrees to continue to take OTC Vit D @5 ,000 IU daily, and will follow up for routine testing of vitamin D, at least 2-3 times per year. She was informed of the risk of over-replacement of vitamin D and agrees to not increase her dose unless she discusses this with [name] first. We will recheck vit D level in 3 months.  Hypertension We discussed sodium restriction, working on healthy weight  loss, and a regular exercise program as the means to achieve improved blood pressure control. Molly [name] agreed with this plan and agreed to follow up as directed. We will continue to monitor her blood pressure as well as her progress with the above lifestyle modifications. She  will continue her medications as prescribed and will watch for signs of hypotension as she continues her lifestyle modifications.  Obesity Molly [name] is currently in the action stage of change. As such, her goal is to continue with weight loss efforts She has agreed to follow the Category 1 plan Molly [name] has been instructed to work up to a goal of 150 minutes of combined cardio and strengthening exercise per week for weight loss and overall health benefits. We discussed the following Behavioral Modification Strategies today: increasing lean protein intake, decreasing simple carbohydrates, no skipping meals, and better snacking choices.   Molly [name] has agreed to follow up with our clinic in 2 weeks. She was informed of the importance of frequent follow up visits to maximize her success with intensive lifestyle modifications for her multiple health conditions.   OBESITY BEHAVIORAL INTERVENTION VISIT  Today's visit was # 2 out of 22.  Starting weight: 223 lb Starting date: 08/31/17 Today's weight : 220 lb Today's date: 09/14/2017 Total lbs lost to date: 3 (Patients must lose 7 lbs in the first 6 months to continue with counseling)   ASK: We discussed the diagnosis of obesity with Molly [name] today and Molly [name] agreed to give [name] permission to discuss obesity behavioral modification therapy today.  ASSESS: Tamari has the diagnosis of obesity and her BMI today is 38.97 Molly [name] is in the action stage of change   ADVISE: Ivett was educated on the multiple health risks of obesity as well as the benefit of weight loss to improve her health. She was advised of the need for long term treatment and the importance of lifestyle modifications.  AGREE: Multiple dietary modification options and treatment options were discussed and  Molly [name] agreed to the above obesity treatment plan.  I, Renee Ramus, am acting as transcriptionist for Ilene Qua, MD I have reviewed the above documentation for accuracy and  completeness, and I agree with the above. - Ilene Qua, MD

## 2017-09-28 ENCOUNTER — Ambulatory Visit (INDEPENDENT_AMBULATORY_CARE_PROVIDER_SITE_OTHER): Payer: Medicare HMO | Admitting: Family Medicine

## 2017-09-28 VITALS — BP 132/74 | HR 89 | Temp 97.6°F | Ht 63.0 in | Wt 220.0 lb

## 2017-09-28 DIAGNOSIS — Z6839 Body mass index (BMI) 39.0-39.9, adult: Secondary | ICD-10-CM | POA: Diagnosis not present

## 2017-09-28 DIAGNOSIS — E7849 Other hyperlipidemia: Secondary | ICD-10-CM | POA: Diagnosis not present

## 2017-09-28 DIAGNOSIS — I10 Essential (primary) hypertension: Secondary | ICD-10-CM

## 2017-09-28 DIAGNOSIS — Z9189 Other specified personal risk factors, not elsewhere classified: Secondary | ICD-10-CM

## 2017-09-28 DIAGNOSIS — E1169 Type 2 diabetes mellitus with other specified complication: Secondary | ICD-10-CM

## 2017-09-28 MED ORDER — ROSUVASTATIN CALCIUM 10 MG PO TABS: 10.0000 mg | ORAL_TABLET | Freq: Every day | ORAL | 0 refills | Status: DC

## 2017-09-28 MED ORDER — METFORMIN HCL 500 MG PO TABS: ORAL_TABLET | ORAL | 0 refills | Status: DC

## 2017-09-28 MED ORDER — LOSARTAN POTASSIUM-HCTZ 100-25 MG PO TABS: 1.0000 | ORAL_TABLET | Freq: Every day | ORAL | 0 refills | Status: DC

## 2017-09-28 NOTE — Progress Notes (Addendum)
Office: [phone]  /  Fax: [phone]   HPI:   Chief Complaint: OBESITY Molly [name] is here to discuss her progress with her obesity treatment plan. She is on the Category 1 plan and is following her eating plan approximately 50 % of the time. She states she is exercising 0 minutes 0 times per week. Molly [name] does really well throughout the week but struggles on weekends. She notices that when she doesn't follow meal plan, she feels poorly. Had an episode of vertigo in the past 2 weeks and couldn't tolerate almost anything by mouth.  Her weight is 220 lb (99.8 kg) today and has not lost weight since her last visit. She has lost 3 lbs since starting treatment with [name].  Diabetes II Harika has a diagnosis of diabetes type II. Markita states she forgot BGs log today and she denies any hypoglycemic episodes. Last A1c was 6.5 on 08/31/17. She has been working on intensive lifestyle modifications including diet, exercise, and weight loss to help control her blood glucose levels.  Hypertension Darleen Moffitt is a 72 y.o. female with hypertension. Varnika's blood pressure is controlled and she denies chest pain, chest pressure, or headache. She is working weight loss to help control her blood pressure with the goal of decreasing her risk of heart attack and stroke. Arabela's blood pressure is currently controlled.  Hyperlipidemia Trishna has hyperlipidemia and has been trying to improve her cholesterol levels with intensive lifestyle modification including a low saturated fat diet, exercise and weight loss. She denies any chest pain, claudication or myalgias.  ALLERGIES: No Known Allergies  MEDICATIONS: Current Outpatient Medications on File Prior to Visit  Medication Sig Dispense Refill  . amLODipine (NORVASC) 10 MG tablet Take 10 mg by mouth daily.     . Cholecalciferol (VITAMIN D3) 5000 units CAPS Take 1 capsule by mouth daily.    [name] co-enzyme Q-10 30 MG capsule Take 30 mg by mouth 3 (three) times daily.    .  diazepam (VALIUM) 5 MG tablet Take 0.5 tablets (2.5 mg total) by mouth every 8 (eight) hours as needed (vertigo/dizziness). 10 tablet 0  . escitalopram (LEXAPRO) 20 MG tablet     . fluticasone (FLONASE) 50 MCG/ACT nasal spray Place 2 sprays into both nostrils daily as needed for allergies or rhinitis. 16 g 5  . furosemide (LASIX) 20 MG tablet Take 20 mg by mouth as needed.     [name] levocetirizine (XYZAL) 5 MG tablet Take 1 tablet (5 mg total) by mouth every evening. 30 tablet 5  . Multiple Vitamins-Minerals (MULTIVITAMIN ADULT PO) Take 1 tablet by mouth daily.    . Omega-3 Fatty Acids (FISH OIL PO) Take 1 tablet by mouth daily.    . ondansetron (ZOFRAN ODT) 4 MG disintegrating tablet Take 1 tablet (4 mg total) by mouth every 8 (eight) hours as needed for nausea or vomiting. 10 tablet 0   No current facility-administered medications on file prior to visit.     PAST MEDICAL HISTORY: Past Medical History:  Diagnosis Date  . Anxiety   . Constipation   . Depression   . Diabetes type 2, controlled ([place])   . Diarrhea   . Fatigue   . Frequent urination   . GERD (gastroesophageal reflux disease)   . Hyperlipidemia   . Hypertension   . Kidney disease   . Leg cramps   . Multiple food allergies   . Muscle stiffness   . Seasonal allergies   . Shortness of breath   .  Sinus pain   . Swallowing difficulty   . Swelling of extremity   . Wheezing     PAST SURGICAL HISTORY: Past Surgical History:  Procedure Laterality Date  . CHOLECYSTECTOMY  1999  . FOOT SURGERY    . MASTECTOMY      SOCIAL HISTORY: Social History   Tobacco Use  . Smoking status: Former Smoker    Types: Cigarettes    Last attempt to quit: 03/23/1998    Years since quitting: 19.5  . Smokeless tobacco: Never Used  Substance Use Topics  . Alcohol use: Yes    Comment: occasional  . Drug use: No    FAMILY HISTORY: Family History  Problem Relation Age of Onset  . Allergic rhinitis Sister   . Eczema Grandchild   .  Eczema Daughter   . Urticaria Daughter   . Lupus Other   . Hypertension Mother   . Stroke Mother   . Obesity Mother   . Hypertension Father   . Heart disease Father   . Sudden death Father   . Asthma Neg Hx   . Atopy Neg Hx   . Angioedema Neg Hx     ROS: Review of Systems  Constitutional: Negative for weight loss.  Cardiovascular: Negative for chest pain and claudication.       Negative chest pressure  Musculoskeletal: Negative for myalgias.  Neurological: Negative for headaches.  Endo/Heme/Allergies:       Negative hypoglycemia    PHYSICAL EXAM: Blood pressure 132/74, pulse 89, temperature 97.6 F (36.4 C), temperature source Oral, height 5\' 3"  (1.6 m), weight 220 lb (99.8 kg), SpO2 98 %. Body mass index is 38.97 kg/m. Physical Exam  Constitutional: She is oriented to person, place, and time. She appears well-developed and well-nourished.  Cardiovascular: Normal rate.  Pulmonary/Chest: Effort normal.  Musculoskeletal: Normal range of motion.  Neurological: She is oriented to person, place, and time.  Skin: Skin is warm and dry.  Psychiatric: She has a normal mood and affect. Her behavior is normal.  Vitals reviewed.   RECENT LABS AND TESTS: BMET    Component Value Date/Time   NA 138 09/12/2017 2118   NA 143 08/31/2017 1140   K 3.4 (L) 09/12/2017 2118   CL 100 (L) 09/12/2017 2118   CO2 27 09/12/2017 2118   GLUCOSE 134 (H) 09/12/2017 2118   BUN 14 09/12/2017 2118   BUN 8 08/31/2017 1140   CREATININE 0.94 09/12/2017 2118   CREATININE 0.90 03/23/2016 1116   CALCIUM 10.3 09/12/2017 2118   GFRNONAA 60 (L) 09/12/2017 2118   GFRAA >60 09/12/2017 2118   Lab Results  Component Value Date   HGBA1C 6.5 (H) 08/31/2017   HGBA1C (H) 08/31/2010    6.0 (NOTE)                                                                       According to the ADA Clinical Practice Recommendations for 2011, when HbA1c is used as a screening test:   >=6.5%   Diagnostic of Diabetes  [place]           (if abnormal result  is confirmed)  5.7-6.4%   Increased risk of developing Diabetes Mellitus  References:Diagnosis and Classification of Diabetes Mellitus,Diabetes GHWE,9937,16(RCVEL  1):S62-S69 and Standards of Medical Care in         Diabetes - 2011,Diabetes KVQQ,5956,38  (Suppl 1):S11-S61.   Lab Results  Component Value Date   INSULIN 20.4 08/31/2017   CBC    Component Value Date/Time   WBC 4.4 09/12/2017 2118   RBC 4.72 09/12/2017 2118   HGB 15.0 09/12/2017 2118   HGB 14.0 08/31/2017 1140   HCT 43.7 09/12/2017 2118   HCT 41.2 08/31/2017 1140   PLT 173 09/12/2017 2118   MCV 92.6 09/12/2017 2118   MCV 92 08/31/2017 1140   MCH 31.8 09/12/2017 2118   MCHC 34.3 09/12/2017 2118   RDW 13.1 09/12/2017 2118   RDW 13.7 08/31/2017 1140   LYMPHSABS 1.7 08/31/2017 1140   MONOABS 324 03/23/2016 1116   EOSABS 0.1 08/31/2017 1140   BASOSABS 0.0 08/31/2017 1140   Iron/TIBC/Ferritin/ %Sat No results found for: IRON, TIBC, FERRITIN, IRONPCTSAT Lipid Panel     Component Value Date/Time   CHOL 150 08/31/2017 1140   TRIG 105 08/31/2017 1140   HDL 66 08/31/2017 1140   CHOLHDL 2.6 08/31/2010 0540   VLDL 12 08/31/2010 0540   LDLCALC 63 08/31/2017 1140   Hepatic Function Panel     Component Value Date/Time   PROT 7.6 08/31/2017 1140   ALBUMIN 4.9 (H) 08/31/2017 1140   AST 18 08/31/2017 1140   ALT 16 08/31/2017 1140   ALKPHOS 56 08/31/2017 1140   BILITOT 0.3 08/31/2017 1140      Component Value Date/Time   TSH 1.680 08/31/2017 1140   TSH 1.57 03/23/2016 1116    ASSESSMENT AND PLAN: Controlled type 2 diabetes mellitus with other specified complication, without long-term current use of insulin ([place]) - Plan: metFORMIN (GLUCOPHAGE) 500 MG tablet  Essential hypertension - Plan: losartan-hydrochlorothiazide (HYZAAR) 100-25 MG tablet  Other hyperlipidemia - Plan: rosuvastatin (CRESTOR) 10 MG tablet  Class 2 severe obesity with serious comorbidity and body mass  index (BMI) of 39.0 to 39.9 in adult, unspecified obesity type ([place])  PLAN:  Diabetes II Ellia has been given extensive diabetes education by myself today including ideal fasting and post-prandial blood glucose readings, individual ideal Hgb A1c goals and hypoglycemia prevention. We discussed the importance of good blood sugar control to decrease the likelihood of diabetic complications such as nephropathy, neuropathy, limb loss, blindness, coronary artery disease, and death. We discussed the importance of intensive lifestyle modification including diet, exercise and weight loss as the first line treatment for diabetes. Brissia agrees to continue taking metformin 500 mg, 2 tablets PO q AM and 1 tablet PO q PM #90 and we will refill for 1 month. Averlee agrees to follow up with our clinic in 2 weeks.  Hypertension We discussed sodium restriction, working on healthy weight loss, and a regular exercise program as the means to achieve improved blood pressure control. Evely agreed with this plan and agreed to follow up as directed. We will continue to monitor her blood pressure as well as her progress with the above lifestyle modifications. Icyss agrees to continue taking losartan-hydrochlorothiazide 100-25 mg PO daily #30 and we will refill for 1 month and she will watch for signs of hypotension as she continues her lifestyle modifications. Cyntha agrees to follow up with our clinic in 2 weeks.  Hyperlipidemia Rolena was informed of the American Heart Association Guidelines emphasizing intensive lifestyle modifications as the first line treatment for hyperlipidemia. We discussed many lifestyle modifications today in depth, and Teleah will continue to work on decreasing saturated  fats such as fatty red meat, butter and many fried foods. She will also increase vegetables and lean protein in her diet and continue to work on exercise and weight loss efforts. [name] agrees to continue taking Crestor 10 mg PO daily #30 and we will  refill for 1 month. Ivelis agrees to follow up with our clinic in 2 weeks.  Obesity Ludella is currently in the action stage of change. As such, her goal is to continue with weight loss efforts She has agreed to follow the Category 1 plan Courtnie has been instructed to work up to a goal of 150 minutes of combined cardio and strengthening exercise per week for weight loss and overall health benefits. We discussed the following Behavioral Modification Strategies today: increasing lean protein intake, work on meal planning and easy cooking plans, increase H20 intake, and planning for success   Chrishonda has agreed to follow up with our clinic in 2 weeks. She was informed of the importance of frequent follow up visits to maximize her success with intensive lifestyle modifications for her multiple health conditions.   OBESITY BEHAVIORAL INTERVENTION VISIT  Today's visit was # 3 out of 22.  Starting weight: 223 lbs Starting date: 08/31/17 Today's weight : 220 lbs Today's date: 09/28/2017 Total lbs lost to date: 3 (Patients must lose 7 lbs in the first 6 months to continue with counseling)   ASK: We discussed the diagnosis of obesity with Kerry Hough today and Mazell agreed to give [name] permission to discuss obesity behavioral modification therapy today.  ASSESS: Brenley has the diagnosis of obesity and her BMI today is 38.98 Emberleigh is in the action stage of change   ADVISE: Lissy was educated on the multiple health risks of obesity as well as the benefit of weight loss to improve her health. She was advised of the need for long term treatment and the importance of lifestyle modifications.  AGREE: Multiple dietary modification options and treatment options were discussed and  Tyteanna agreed to the above obesity treatment plan.  I, Trixie Dredge, am acting as transcriptionist for Ilene Qua, MD  I have reviewed the above documentation for accuracy and completeness, and I agree with the above. - Ilene Qua, MD

## 2017-10-12 ENCOUNTER — Ambulatory Visit (INDEPENDENT_AMBULATORY_CARE_PROVIDER_SITE_OTHER): Payer: Medicare HMO | Admitting: Family Medicine

## 2017-10-12 VITALS — BP 134/81 | HR 94 | Temp 98.0°F | Ht 63.0 in | Wt 216.0 lb

## 2017-10-12 DIAGNOSIS — I10 Essential (primary) hypertension: Secondary | ICD-10-CM | POA: Diagnosis not present

## 2017-10-12 DIAGNOSIS — E119 Type 2 diabetes mellitus without complications: Secondary | ICD-10-CM | POA: Diagnosis not present

## 2017-10-12 DIAGNOSIS — Z6838 Body mass index (BMI) 38.0-38.9, adult: Secondary | ICD-10-CM | POA: Diagnosis not present

## 2017-10-13 NOTE — Progress Notes (Signed)
Office: [phone]  /  Fax: [phone]   HPI:   Chief Complaint: OBESITY Molly [name] is here to discuss her progress with her obesity treatment plan. She is on the Category 1 plan and is following her eating plan approximately 85 % of the time. She states she is stretching and using bands for 8-10 minutes 3-4 times per week. Molly [name] really enjoyed Category 1 plan. Occasionally gets hungry if she eats cereal option for breakfast. Planning for an increase in activity with change in weather.  Her weight is 216 lb (98 kg) today and has had a weight loss of 4 pounds over a period of 2 weeks since her last visit. She has lost 7 lbs since starting treatment with [name].  Diabetes II Molly [name] has a diagnosis of diabetes type II. Molly [name] states fasting BGs range between 102 and 128. She denies any hypoglycemic episodes. Last A1c was 6.5 on 08/31/17. She has been working on intensive lifestyle modifications including diet, exercise, and weight loss to help control her blood glucose levels.  Hypertension Molly [name] is a 72 y.o. female with hypertension. Molly [name]'s blood pressure is well controlled today. She denies chest pain, chest pressure, or headache. She is working weight loss to help control her blood pressure with the goal of decreasing her risk of heart attack and stroke.  ALLERGIES: No Known Allergies  MEDICATIONS: Current Outpatient Medications on File Prior to Visit  Medication Sig Dispense Refill  . amLODipine (NORVASC) 10 MG tablet Take 10 mg by mouth daily.     . Cholecalciferol (VITAMIN D3) 5000 units CAPS Take 1 capsule by mouth daily.    [name] co-enzyme Q-10 30 MG capsule Take 30 mg by mouth 3 (three) times daily.    . diazepam (VALIUM) 5 MG tablet Take 0.5 tablets (2.5 mg total) by mouth every 8 (eight) hours as needed (vertigo/dizziness). 10 tablet 0  . escitalopram (LEXAPRO) 20 MG tablet     . fluticasone (FLONASE) 50 MCG/ACT nasal spray Place 2 sprays into both nostrils daily as needed for allergies  or rhinitis. 16 g 5  . furosemide (LASIX) 20 MG tablet Take 20 mg by mouth as needed.     [name] levocetirizine (XYZAL) 5 MG tablet Take 1 tablet (5 mg total) by mouth every evening. 30 tablet 5  . losartan-hydrochlorothiazide (HYZAAR) 100-25 MG tablet Take 1 tablet by mouth daily. 30 tablet 0  . metFORMIN (GLUCOPHAGE) 500 MG tablet Take 2 in the AM and 1 in the PM 90 tablet 0  . Multiple Vitamins-Minerals (MULTIVITAMIN ADULT PO) Take 1 tablet by mouth daily.    . Omega-3 Fatty Acids (FISH OIL PO) Take 1 tablet by mouth daily.    . ondansetron (ZOFRAN ODT) 4 MG disintegrating tablet Take 1 tablet (4 mg total) by mouth every 8 (eight) hours as needed for nausea or vomiting. 10 tablet 0  . rosuvastatin (CRESTOR) 10 MG tablet Take 1 tablet (10 mg total) by mouth daily. 30 tablet 0   No current facility-administered medications on file prior to visit.     PAST MEDICAL HISTORY: Past Medical History:  Diagnosis Date  . Anxiety   . Constipation   . Depression   . Diabetes type 2, controlled ([place])   . Diarrhea   . Fatigue   . Frequent urination   . GERD (gastroesophageal reflux disease)   . Hyperlipidemia   . Hypertension   . Kidney disease   . Leg cramps   . Multiple food allergies   .  Muscle stiffness   . Seasonal allergies   . Shortness of breath   . Sinus pain   . Swallowing difficulty   . Swelling of extremity   . Wheezing     PAST SURGICAL HISTORY: Past Surgical History:  Procedure Laterality Date  . CHOLECYSTECTOMY  1999  . FOOT SURGERY    . MASTECTOMY      SOCIAL HISTORY: Social History   Tobacco Use  . Smoking status: Former Smoker    Types: Cigarettes    Last attempt to quit: 03/23/1998    Years since quitting: 19.5  . Smokeless tobacco: Never Used  Substance Use Topics  . Alcohol use: Yes    Comment: occasional  . Drug use: No    FAMILY HISTORY: Family History  Problem Relation Age of Onset  . Allergic rhinitis Sister   . Eczema Grandchild   . Eczema  Daughter   . Urticaria Daughter   . Lupus Other   . Hypertension Mother   . Stroke Mother   . Obesity Mother   . Hypertension Father   . Heart disease Father   . Sudden death Father   . Asthma Neg Hx   . Atopy Neg Hx   . Angioedema Neg Hx     ROS: Review of Systems  Constitutional: Positive for weight loss.  Cardiovascular: Negative for chest pain.       Negative chest pressure  Genitourinary: Negative for frequency.  Neurological: Negative for headaches.  Endo/Heme/Allergies:       Negative hypoglycemia Negative polyphagia    PHYSICAL EXAM: Blood pressure 134/81, pulse 94, temperature 98 F (36.7 C), temperature source Oral, height 5\' 3"  (1.6 m), weight 216 lb (98 kg), SpO2 100 %. Body mass index is 38.26 kg/m. Physical Exam  Constitutional: She is oriented to person, place, and time. She appears well-developed and well-nourished.  Cardiovascular: Normal rate.  Pulmonary/Chest: Effort normal.  Musculoskeletal: Normal range of motion.  Neurological: She is oriented to person, place, and time.  Skin: Skin is warm and dry.  Psychiatric: She has a normal mood and affect. Her behavior is normal.  Vitals reviewed.   RECENT LABS AND TESTS: BMET    Component Value Date/Time   NA 138 09/12/2017 2118   NA 143 08/31/2017 1140   K 3.4 (L) 09/12/2017 2118   CL 100 (L) 09/12/2017 2118   CO2 27 09/12/2017 2118   GLUCOSE 134 (H) 09/12/2017 2118   BUN 14 09/12/2017 2118   BUN 8 08/31/2017 1140   CREATININE 0.94 09/12/2017 2118   CREATININE 0.90 03/23/2016 1116   CALCIUM 10.3 09/12/2017 2118   GFRNONAA 60 (L) 09/12/2017 2118   GFRAA >60 09/12/2017 2118   Lab Results  Component Value Date   HGBA1C 6.5 (H) 08/31/2017   HGBA1C (H) 08/31/2010    6.0 (NOTE)                                                                       According to the ADA Clinical Practice Recommendations for 2011, when HbA1c is used as a screening test:   >=6.5%   Diagnostic of Diabetes Mellitus            (if abnormal result  is confirmed)  5.7-6.4%  Increased risk of developing Diabetes Mellitus  References:Diagnosis and Classification of Diabetes Mellitus,Diabetes FBPZ,0258,52(DPOEU 1):S62-S69 and Standards of Medical Care in         Diabetes - 2011,Diabetes MPNT,6144,31  (Suppl 1):S11-S61.   Lab Results  Component Value Date   INSULIN 20.4 08/31/2017   CBC    Component Value Date/Time   WBC 4.4 09/12/2017 2118   RBC 4.72 09/12/2017 2118   HGB 15.0 09/12/2017 2118   HGB 14.0 08/31/2017 1140   HCT 43.7 09/12/2017 2118   HCT 41.2 08/31/2017 1140   PLT 173 09/12/2017 2118   MCV 92.6 09/12/2017 2118   MCV 92 08/31/2017 1140   MCH 31.8 09/12/2017 2118   MCHC 34.3 09/12/2017 2118   RDW 13.1 09/12/2017 2118   RDW 13.7 08/31/2017 1140   LYMPHSABS 1.7 08/31/2017 1140   MONOABS 324 03/23/2016 1116   EOSABS 0.1 08/31/2017 1140   BASOSABS 0.0 08/31/2017 1140   Iron/TIBC/Ferritin/ %Sat No results found for: IRON, TIBC, FERRITIN, IRONPCTSAT Lipid Panel     Component Value Date/Time   CHOL 150 08/31/2017 1140   TRIG 105 08/31/2017 1140   HDL 66 08/31/2017 1140   CHOLHDL 2.6 08/31/2010 0540   VLDL 12 08/31/2010 0540   LDLCALC 63 08/31/2017 1140   Hepatic Function Panel     Component Value Date/Time   PROT 7.6 08/31/2017 1140   ALBUMIN 4.9 (H) 08/31/2017 1140   AST 18 08/31/2017 1140   ALT 16 08/31/2017 1140   ALKPHOS 56 08/31/2017 1140   BILITOT 0.3 08/31/2017 1140      Component Value Date/Time   TSH 1.680 08/31/2017 1140   TSH 1.57 03/23/2016 1116    ASSESSMENT AND PLAN: Type 2 diabetes mellitus without complication, without long-term current use of insulin ([facility])  Essential hypertension  Class 2 severe obesity with serious comorbidity and body mass index (BMI) of 38.0 to 38.9 in adult, unspecified obesity type ([place])  PLAN:  Diabetes II Blu has been given extensive diabetes education by myself today including ideal fasting and post-prandial blood glucose  readings, individual ideal Hgb A1c goals and hypoglycemia prevention. We discussed the importance of good blood sugar control to decrease the likelihood of diabetic complications such as nephropathy, neuropathy, limb loss, blindness, coronary artery disease, and death. We discussed the importance of intensive lifestyle modification including diet, exercise and weight loss as the first line treatment for diabetes. Tykesha agrees to continue taking metformin 1,000 mg q AM and 500 mg q PM. Terrel agrees to follow up with our clinic in 2 weeks.  Hypertension We discussed sodium restriction, working on healthy weight loss, and a regular exercise program as the means to achieve improved blood pressure control. [place] agreed with this plan and agreed to follow up as directed. We will continue to monitor her blood pressure as well as her progress with the above lifestyle modifications. Nicey agrees to continue taking Hyzaar, Norvasc, and Lasix and will watch for signs of hypotension as she continues her lifestyle modifications. Dellie agrees to follow up with our clinic in 2 weeks.  We spent > than 50% of the 15 minute visit on the counseling as documented in the note.  Obesity Dimitra is currently in the action stage of change. As such, her goal is to continue with weight loss efforts She has agreed to follow the Category 1 plan Darnetta has been instructed to work up to a goal of 150 minutes of combined cardio and strengthening exercise per week for weight loss  and overall health benefits. We discussed the following Behavioral Modification Strategies today: increasing lean protein intake, decreasing simple carbohydrates, work on meal planning and easy cooking plans, and planning for success   Alyiah has agreed to follow up with our clinic in 2 weeks. She was informed of the importance of frequent follow up visits to maximize her success with intensive lifestyle modifications for her multiple health conditions.   OBESITY  BEHAVIORAL INTERVENTION VISIT  Today's visit was # 4 out of 22.  Starting weight: 223 lbs Starting date: 08/31/17 Today's weight : 216 lbs Today's date: 10/12/2017 Total lbs lost to date: 7 (Patients must lose 7 lbs in the first 6 months to continue with counseling)   ASK: We discussed the diagnosis of obesity with Kerry Hough today and Matty agreed to give [name] permission to discuss obesity behavioral modification therapy today.  ASSESS: Saniah has the diagnosis of obesity and her BMI today is 38.27 Karrina is in the action stage of change   ADVISE: Sanya was educated on the multiple health risks of obesity as well as the benefit of weight loss to improve her health. She was advised of the need for long term treatment and the importance of lifestyle modifications.  AGREE: Multiple dietary modification options and treatment options were discussed and  Kiaira agreed to the above obesity treatment plan.  I, Trixie Dredge, am acting as transcriptionist for Ilene Qua, MD  I have reviewed the above documentation for accuracy and completeness, and I agree with the above. - Ilene Qua, MD

## 2017-10-28 ENCOUNTER — Ambulatory Visit (INDEPENDENT_AMBULATORY_CARE_PROVIDER_SITE_OTHER): Payer: Medicare HMO | Admitting: Family Medicine

## 2017-10-28 VITALS — BP 129/75 | HR 91 | Temp 98.0°F | Ht 63.0 in | Wt 217.0 lb

## 2017-10-28 DIAGNOSIS — I10 Essential (primary) hypertension: Secondary | ICD-10-CM

## 2017-10-28 DIAGNOSIS — E66812 Obesity, class 2: Secondary | ICD-10-CM

## 2017-10-28 DIAGNOSIS — E7849 Other hyperlipidemia: Secondary | ICD-10-CM | POA: Diagnosis not present

## 2017-10-28 DIAGNOSIS — Z6838 Body mass index (BMI) 38.0-38.9, adult: Secondary | ICD-10-CM

## 2017-10-28 DIAGNOSIS — E1169 Type 2 diabetes mellitus with other specified complication: Secondary | ICD-10-CM | POA: Diagnosis not present

## 2017-10-28 MED ORDER — METFORMIN HCL 500 MG PO TABS: ORAL_TABLET | ORAL | 0 refills | Status: DC

## 2017-10-28 MED ORDER — AMLODIPINE BESYLATE 10 MG PO TABS: 10.0000 mg | ORAL_TABLET | Freq: Every day | ORAL | 0 refills | Status: DC

## 2017-10-28 MED ORDER — ROSUVASTATIN CALCIUM 10 MG PO TABS: 10.0000 mg | ORAL_TABLET | Freq: Every day | ORAL | 0 refills | Status: DC

## 2017-10-28 NOTE — Progress Notes (Signed)
Office: [phone]  /  Fax: [phone]   HPI:   Chief Complaint: OBESITY Molly [name] is here to discuss her progress with her obesity treatment plan. She is on the Category 1 plan and is following her eating plan approximately 80 % of the time. She states she is walking for 15 minutes 2 times per week. Molly [name] felt she did well any may have over indulged with burgers and sides at barbecue. When she was following plan, she may not have gotten all protein in. Occasionally doing eggs options from breakfast for lunch. Her weight is 217 lb (98.4 kg) today and has gained 1 pound since her last visit. She has lost 6 lbs since starting treatment with [name].  Hypertension Molly [name] is a 72 y.o. female with hypertension. Molly [name] is on Hyzaar, Norvasc, and Crestor, just got 90 day supply of losartan-hydrochlorothiazide. She denies chest pain. She is working weight loss to help control her blood pressure with the goal of decreasing her risk of heart attack and stroke. Molly [name]'s blood pressure is currently controlled.  Diabetes II Molly [name] has a diagnosis of diabetes type II. Molly [name] denies cravings. She denies any hypoglycemic episodes. Last A1c was 6.5 on 08/31/17. She has been working on intensive lifestyle modifications including diet, exercise, and weight loss to help control her blood glucose levels.  ALLERGIES: No Known Allergies  MEDICATIONS: Current Outpatient Medications on File Prior to Visit  Medication Sig Dispense Refill  . Cholecalciferol (VITAMIN D3) 5000 units CAPS Take 1 capsule by mouth daily.    [name] co-enzyme Q-10 30 MG capsule Take 30 mg by mouth 3 (three) times daily.    . diazepam (VALIUM) 5 MG tablet Take 0.5 tablets (2.5 mg total) by mouth every 8 (eight) hours as needed (vertigo/dizziness). 10 tablet 0  . escitalopram (LEXAPRO) 20 MG tablet     . fluticasone (FLONASE) 50 MCG/ACT nasal spray Place 2 sprays into both nostrils daily as needed for allergies or rhinitis. 16 g 5  . furosemide (LASIX)  20 MG tablet Take 20 mg by mouth as needed.     [name] levocetirizine (XYZAL) 5 MG tablet Take 1 tablet (5 mg total) by mouth every evening. 30 tablet 5  . losartan-hydrochlorothiazide (HYZAAR) 100-25 MG tablet Take 1 tablet by mouth daily. 30 tablet 0  . Multiple Vitamins-Minerals (MULTIVITAMIN ADULT PO) Take 1 tablet by mouth daily.    . Omega-3 Fatty Acids (FISH OIL PO) Take 1 tablet by mouth daily.    . ondansetron (ZOFRAN ODT) 4 MG disintegrating tablet Take 1 tablet (4 mg total) by mouth every 8 (eight) hours as needed for nausea or vomiting. 10 tablet 0   No current facility-administered medications on file prior to visit.     PAST MEDICAL HISTORY: Past Medical History:  Diagnosis Date  . Anxiety   . Constipation   . Depression   . Diabetes type 2, controlled ([place])   . Diarrhea   . Fatigue   . Frequent urination   . GERD (gastroesophageal reflux disease)   . Hyperlipidemia   . Hypertension   . Kidney disease   . Leg cramps   . Multiple food allergies   . Muscle stiffness   . Seasonal allergies   . Shortness of breath   . Sinus pain   . Swallowing difficulty   . Swelling of extremity   . Wheezing     PAST SURGICAL HISTORY: Past Surgical History:  Procedure Laterality Date  . CHOLECYSTECTOMY  1999  . FOOT  SURGERY    . MASTECTOMY      SOCIAL HISTORY: Social History   Tobacco Use  . Smoking status: Former Smoker    Types: Cigarettes    Last attempt to quit: 03/23/1998    Years since quitting: 19.6  . Smokeless tobacco: Never Used  Substance Use Topics  . Alcohol use: Yes    Comment: occasional  . Drug use: No    FAMILY HISTORY: Family History  Problem Relation Age of Onset  . Allergic rhinitis Sister   . Eczema Grandchild   . Eczema Daughter   . Urticaria Daughter   . Lupus Other   . Hypertension Mother   . Stroke Mother   . Obesity Mother   . Hypertension Father   . Heart disease Father   . Sudden death Father   . Asthma Neg Hx   . Atopy Neg Hx     . Angioedema Neg Hx     ROS: Review of Systems  Constitutional: Negative for weight loss.  Cardiovascular: Negative for chest pain.  Endo/Heme/Allergies:       Negative hypoglycemia    PHYSICAL EXAM: Blood pressure 129/75, pulse 91, temperature 98 F (36.7 C), temperature source Oral, height 5\' 3"  (1.6 m), weight 217 lb (98.4 kg), SpO2 99 %. Body mass index is 38.44 kg/m. Physical Exam  Constitutional: She is oriented to person, place, and time. She appears well-developed and well-nourished.  Cardiovascular: Normal rate.  Pulmonary/Chest: Effort normal.  Musculoskeletal: Normal range of motion.  Neurological: She is oriented to person, place, and time.  Skin: Skin is warm and dry.  Psychiatric: She has a normal mood and affect. Her behavior is normal.  Vitals reviewed.   RECENT LABS AND TESTS: BMET    Component Value Date/Time   NA 138 09/12/2017 2118   NA 143 08/31/2017 1140   K 3.4 (L) 09/12/2017 2118   CL 100 (L) 09/12/2017 2118   CO2 27 09/12/2017 2118   GLUCOSE 134 (H) 09/12/2017 2118   BUN 14 09/12/2017 2118   BUN 8 08/31/2017 1140   CREATININE 0.94 09/12/2017 2118   CREATININE 0.90 03/23/2016 1116   CALCIUM 10.3 09/12/2017 2118   GFRNONAA 60 (L) 09/12/2017 2118   GFRAA >60 09/12/2017 2118   Lab Results  Component Value Date   HGBA1C 6.5 (H) 08/31/2017   HGBA1C (H) 08/31/2010    6.0 (NOTE)                                                                       According to the ADA Clinical Practice Recommendations for 2011, when HbA1c is used as a screening test:   >=6.5%   Diagnostic of Diabetes Mellitus           (if abnormal result  is confirmed)  5.7-6.4%   Increased risk of developing Diabetes Mellitus  References:Diagnosis and Classification of Diabetes Mellitus,Diabetes XBDZ,3299,24(QASTM 1):S62-S69 and Standards of Medical Care in         Diabetes - 2011,Diabetes Care,2011,34  (Suppl 1):S11-S61.   Lab Results  Component Value Date   INSULIN 20.4  08/31/2017   CBC    Component Value Date/Time   WBC 4.4 09/12/2017 2118   RBC 4.72 09/12/2017 2118   HGB  15.0 09/12/2017 2118   HGB 14.0 08/31/2017 1140   HCT 43.7 09/12/2017 2118   HCT 41.2 08/31/2017 1140   PLT 173 09/12/2017 2118   MCV 92.6 09/12/2017 2118   MCV 92 08/31/2017 1140   MCH 31.8 09/12/2017 2118   MCHC 34.3 09/12/2017 2118   RDW 13.1 09/12/2017 2118   RDW 13.7 08/31/2017 1140   LYMPHSABS 1.7 08/31/2017 1140   MONOABS 324 03/23/2016 1116   EOSABS 0.1 08/31/2017 1140   BASOSABS 0.0 08/31/2017 1140   Iron/TIBC/Ferritin/ %Sat No results found for: IRON, TIBC, FERRITIN, IRONPCTSAT Lipid Panel     Component Value Date/Time   CHOL 150 08/31/2017 1140   TRIG 105 08/31/2017 1140   HDL 66 08/31/2017 1140   CHOLHDL 2.6 08/31/2010 0540   VLDL 12 08/31/2010 0540   LDLCALC 63 08/31/2017 1140   Hepatic Function Panel     Component Value Date/Time   PROT 7.6 08/31/2017 1140   ALBUMIN 4.9 (H) 08/31/2017 1140   AST 18 08/31/2017 1140   ALT 16 08/31/2017 1140   ALKPHOS 56 08/31/2017 1140   BILITOT 0.3 08/31/2017 1140      Component Value Date/Time   TSH 1.680 08/31/2017 1140   TSH 1.57 03/23/2016 1116    ASSESSMENT AND PLAN: Essential hypertension - Plan: amLODipine (NORVASC) 10 MG tablet  Other hyperlipidemia - Plan: rosuvastatin (CRESTOR) 10 MG tablet  Controlled type 2 diabetes mellitus with other specified complication, without long-term current use of insulin ([place]) - Plan: metFORMIN (GLUCOPHAGE) 500 MG tablet  Class 2 severe obesity with serious comorbidity and body mass index (BMI) of 38.0 to 38.9 in adult, unspecified obesity type ([place])  PLAN:  Hypertension We discussed sodium restriction, working on healthy weight loss, and a regular exercise program as the means to achieve improved blood pressure control. Molly [name] agreed with this plan and agreed to follow up as directed. We will continue to monitor her blood pressure as well as her progress with the  above lifestyle modifications. Molly [name] agrees to to continue taking amlodipine 10 mg PO daily #30 and we will refill for 1 month and she agrees to continue taking Crestor 10 mg PO daily #30 and we will refill for 1 month. She will watch for signs of hypotension as she continues her lifestyle modifications. Molly [name] agrees to follow up with our clinic in 2 weeks.  Diabetes II Molly [name] has been given extensive diabetes education by myself today including ideal fasting and post-prandial blood glucose readings, individual ideal Hgb A1c goals and hypoglycemia prevention. We discussed the importance of good blood sugar control to decrease the likelihood of diabetic complications such as nephropathy, neuropathy, limb loss, blindness, coronary artery disease, and death. We discussed the importance of intensive lifestyle modification including diet, exercise and weight loss as the first line treatment for diabetes. Molly [name] agrees to continue taking metformin 500 mg PO 2 tablets q AM and 1 tablet q PM #90 and we will refill for 1 month. We will recheck labs in mid May and Molly [name] agrees to follow up with our clinic in 2 weeks.  Obesity Molly [name] is currently in the action stage of change. As such, her goal is to continue with weight loss efforts She has agreed to follow the Category 1 plan with breakfast options Molly [name] has been instructed to work up to a goal of 150 minutes of combined cardio and strengthening exercise per week for weight loss and overall health benefits. We discussed the following Behavioral Modification Strategies today: increasing lean  protein intake, decreasing simple carbohydrates, work on meal planning and easy cooking plans, keeping healthy foods in the home, better snacking choices, and planning for success Resistance training discussion at next visit.  Molly [name] has agreed to follow up with our clinic in 2 weeks. She was informed of the importance of frequent follow up visits to maximize her success with intensive  lifestyle modifications for her multiple health conditions.   OBESITY BEHAVIORAL INTERVENTION VISIT  Today's visit was # 5 out of 22.  Starting weight: 223 lbs Starting date: 08/31/17 Today's weight : 217 lbs Today's date: 10/28/2017 Total lbs lost to date: 6 (Patients must lose 7 lbs in the first 6 months to continue with counseling)   ASK: We discussed the diagnosis of obesity with Molly [name] today and Molly [name] agreed to give [name] permission to discuss obesity behavioral modification therapy today.  ASSESS: Molly [name] has the diagnosis of obesity and her BMI today is 38.45 Molly [name] is in the action stage of change   ADVISE: Molly [name] was educated on the multiple health risks of obesity as well as the benefit of weight loss to improve her health. She was advised of the need for long term treatment and the importance of lifestyle modifications.  AGREE: Multiple dietary modification options and treatment options were discussed and  Molly [name] agreed to the above obesity treatment plan.  I, Molly [name], am acting as transcriptionist for Ilene Qua, MD  I have reviewed the above documentation for accuracy and completeness, and I agree with the above. - Ilene Qua, MD

## 2017-11-16 ENCOUNTER — Ambulatory Visit (INDEPENDENT_AMBULATORY_CARE_PROVIDER_SITE_OTHER): Payer: Medicare HMO | Admitting: Family Medicine

## 2017-11-16 VITALS — BP 124/74 | HR 90 | Temp 98.2°F | Ht 63.0 in | Wt 214.0 lb

## 2017-11-16 DIAGNOSIS — I10 Essential (primary) hypertension: Secondary | ICD-10-CM

## 2017-11-16 DIAGNOSIS — Z6838 Body mass index (BMI) 38.0-38.9, adult: Secondary | ICD-10-CM

## 2017-11-16 DIAGNOSIS — E119 Type 2 diabetes mellitus without complications: Secondary | ICD-10-CM

## 2017-11-16 DIAGNOSIS — E7849 Other hyperlipidemia: Secondary | ICD-10-CM

## 2017-11-16 MED ORDER — ROSUVASTATIN CALCIUM 10 MG PO TABS: 10.0000 mg | ORAL_TABLET | Freq: Every day | ORAL | 0 refills | Status: DC

## 2017-11-16 MED ORDER — AMLODIPINE BESYLATE 10 MG PO TABS: 10.0000 mg | ORAL_TABLET | Freq: Every day | ORAL | 0 refills | Status: DC

## 2017-11-17 NOTE — Progress Notes (Signed)
Office: [phone]  /  Fax: [phone]   HPI:   Chief Complaint: OBESITY Molly [name] is here to discuss Molly [name] progress with Molly [name] obesity treatment plan. Molly [name] is on the Category 1 plan with breakfast options and is following Molly [name] eating plan approximately 80 to 85 % of the time. Molly [name] states Molly [name] is walking for 20 to 30 minutes 2 to 3 times per week. Ailea just celebrated Molly [name] grandson's birthday and Easter and only indulged a little bit. Molly [name] has Molly [name] birthday celebration this week. Molly [name] weight is   today and has had a weight loss of 3 pounds over a period of 2 to 3 weeks since Molly [name] last visit. Molly [name] has lost 9 lbs since starting treatment with [name].  Hypertension Molly [name] is a 72 y.o. female with hypertension. [name] denies chest pain, chest pressure or headache. Molly [name] is working weight loss to help control Molly [name] blood pressure with the goal of decreasing Molly [name] risk of heart attack and stroke. Molly [name] blood pressure is controlled today.  Hyperlipidemia Molly [name] has hyperlipidemia and has been trying to improve Molly [name] cholesterol levels with intensive lifestyle modification including a low saturated fat diet, exercise and weight loss. Molly [name] denies any myalgias.  Diabetes II Molly [name] has a diagnosis of diabetes type II. Molly [name] denies any hypoglycemic episodes or carb cravings. Molly [name] has been working on intensive lifestyle modifications including diet, exercise, and weight loss to help control Molly [name] blood glucose levels.  ALLERGIES: No Known Allergies  MEDICATIONS: Current Outpatient Medications on File Prior to Visit  Medication Sig Dispense Refill  . Cholecalciferol (VITAMIN D3) 5000 units CAPS Take 1 capsule by mouth daily.    [name] co-enzyme Q-10 30 MG capsule Take 30 mg by mouth 3 (three) times daily.    . diazepam (VALIUM) 5 MG tablet Take 0.5 tablets (2.5 mg total) by mouth every 8 (eight) hours as needed (vertigo/dizziness). 10 tablet 0  . escitalopram (LEXAPRO) 20 MG tablet     . fluticasone (FLONASE) 50 MCG/ACT nasal  spray Place 2 sprays into both nostrils daily as needed for allergies or rhinitis. 16 g 5  . furosemide (LASIX) 20 MG tablet Take 20 mg by mouth as needed.     [name] levocetirizine (XYZAL) 5 MG tablet Take 1 tablet (5 mg total) by mouth every evening. 30 tablet 5  . losartan-hydrochlorothiazide (HYZAAR) 100-25 MG tablet Take 1 tablet by mouth daily. 30 tablet 0  . metFORMIN (GLUCOPHAGE) 500 MG tablet Take 2 in the AM and 1 in the PM 90 tablet 0  . Multiple Vitamins-Minerals (MULTIVITAMIN ADULT PO) Take 1 tablet by mouth daily.    . Omega-3 Fatty Acids (FISH OIL PO) Take 1 tablet by mouth daily.    . ondansetron (ZOFRAN ODT) 4 MG disintegrating tablet Take 1 tablet (4 mg total) by mouth every 8 (eight) hours as needed for nausea or vomiting. 10 tablet 0   No current facility-administered medications on file prior to visit.     PAST MEDICAL HISTORY: Past Medical History:  Diagnosis Date  . Anxiety   . Constipation   . Depression   . Diabetes type 2, controlled ([place])   . Diarrhea   . Fatigue   . Frequent urination   . GERD (gastroesophageal reflux disease)   . Hyperlipidemia   . Hypertension   . Kidney disease   . Leg cramps   . Multiple food allergies   . Muscle stiffness   . Seasonal allergies   . Shortness of breath   .  Sinus pain   . Swallowing difficulty   . Swelling of extremity   . Wheezing     PAST SURGICAL HISTORY: Past Surgical History:  Procedure Laterality Date  . CHOLECYSTECTOMY  1999  . FOOT SURGERY    . MASTECTOMY      SOCIAL HISTORY: Social History   Tobacco Use  . Smoking status: Former Smoker    Types: Cigarettes    Last attempt to quit: 03/23/1998    Years since quitting: 19.6  . Smokeless tobacco: Never Used  Substance Use Topics  . Alcohol use: Yes    Comment: occasional  . Drug use: No    FAMILY HISTORY: Family History  Problem Relation Age of Onset  . Allergic rhinitis Sister   . Eczema Grandchild   . Eczema Daughter   . Urticaria  Daughter   . Lupus Other   . Hypertension Mother   . Stroke Mother   . Obesity Mother   . Hypertension Father   . Heart disease Father   . Sudden death Father   . Asthma Neg Hx   . Atopy Neg Hx   . Angioedema Neg Hx     ROS: Review of Systems  Constitutional: Positive for weight loss.  Cardiovascular: Negative for chest pain.       Negative for chest pressure  Musculoskeletal: Negative for myalgias.  Neurological: Negative for headaches.  Endo/Heme/Allergies:       Negative for carb cravings Negative for hypoglycemia    PHYSICAL EXAM: Blood pressure 124/74, pulse 90, temperature 98.2 F (36.8 C), temperature source Oral, SpO2 99 %. There is no height or weight on file to calculate BMI. Physical Exam  Constitutional: Molly [name] is oriented to person, place, and time. Molly [name] appears well-developed and well-nourished.  Cardiovascular: Normal rate.  Pulmonary/Chest: Effort normal.  Musculoskeletal: Normal range of motion.  Neurological: Molly [name] is oriented to person, place, and time.  Skin: Skin is warm and dry.  Psychiatric: Molly [name] has a normal mood and affect. Molly [name] behavior is normal.  Vitals reviewed.   RECENT LABS AND TESTS: BMET    Component Value Date/Time   NA 138 09/12/2017 2118   NA 143 08/31/2017 1140   K 3.4 (L) 09/12/2017 2118   CL 100 (L) 09/12/2017 2118   CO2 27 09/12/2017 2118   GLUCOSE 134 (H) 09/12/2017 2118   BUN 14 09/12/2017 2118   BUN 8 08/31/2017 1140   CREATININE 0.94 09/12/2017 2118   CREATININE 0.90 03/23/2016 1116   CALCIUM 10.3 09/12/2017 2118   GFRNONAA 60 (L) 09/12/2017 2118   GFRAA >60 09/12/2017 2118   Lab Results  Component Value Date   HGBA1C 6.5 (H) 08/31/2017   HGBA1C (H) 08/31/2010    6.0 (NOTE)                                                                       According to the ADA Clinical Practice Recommendations for 2011, when HbA1c is used as a screening test:   >=6.5%   Diagnostic of Diabetes Mellitus           (if abnormal result   is confirmed)  5.7-6.4%   Increased risk of developing Diabetes Mellitus  References:Diagnosis and Classification of Diabetes Mellitus,Diabetes ERXV,4008,67(YPPJK 1):S62-S69  and Standards of Medical Care in         Diabetes - 2011,Diabetes HBZJ,6967,89  (Suppl 1):S11-S61.   Lab Results  Component Value Date   INSULIN 20.4 08/31/2017   CBC    Component Value Date/Time   WBC 4.4 09/12/2017 2118   RBC 4.72 09/12/2017 2118   HGB 15.0 09/12/2017 2118   HGB 14.0 08/31/2017 1140   HCT 43.7 09/12/2017 2118   HCT 41.2 08/31/2017 1140   PLT 173 09/12/2017 2118   MCV 92.6 09/12/2017 2118   MCV 92 08/31/2017 1140   MCH 31.8 09/12/2017 2118   MCHC 34.3 09/12/2017 2118   RDW 13.1 09/12/2017 2118   RDW 13.7 08/31/2017 1140   LYMPHSABS 1.7 08/31/2017 1140   MONOABS 324 03/23/2016 1116   EOSABS 0.1 08/31/2017 1140   BASOSABS 0.0 08/31/2017 1140   Iron/TIBC/Ferritin/ %Sat No results found for: IRON, TIBC, FERRITIN, IRONPCTSAT Lipid Panel     Component Value Date/Time   CHOL 150 08/31/2017 1140   TRIG 105 08/31/2017 1140   HDL 66 08/31/2017 1140   CHOLHDL 2.6 08/31/2010 0540   VLDL 12 08/31/2010 0540   LDLCALC 63 08/31/2017 1140   Hepatic Function Panel     Component Value Date/Time   PROT 7.6 08/31/2017 1140   ALBUMIN 4.9 (H) 08/31/2017 1140   AST 18 08/31/2017 1140   ALT 16 08/31/2017 1140   ALKPHOS 56 08/31/2017 1140   BILITOT 0.3 08/31/2017 1140      Component Value Date/Time   TSH 1.680 08/31/2017 1140   TSH 1.57 03/23/2016 1116   Results for TAVARIA, MACKINS (MRN [id number]) as of 11/17/2017 17:47  Ref. Range 08/31/2017 11:40  Vitamin D, 25-Hydroxy Latest Ref Range: 30.0 - 100.0 ng/mL 49.8   ASSESSMENT AND PLAN: Essential hypertension - Plan: amLODipine (NORVASC) 10 MG tablet  Type 2 diabetes mellitus without complication, without long-term current use of insulin ([facility])  Other hyperlipidemia - Plan: rosuvastatin (CRESTOR) 10 MG tablet  Class 2 severe obesity with serious  comorbidity and body mass index (BMI) of 38.0 to 38.9 in adult, unspecified obesity type ([place])  PLAN:  Hypertension We discussed sodium restriction, working on healthy weight loss, and a regular exercise program as the means to achieve improved blood pressure control. Zasha agreed with this plan and agreed to follow up as directed. We will continue to monitor Molly [name] blood pressure as well as Molly [name] progress with the above lifestyle modifications. Molly [name] agreed to continue amlodipine 10 mg daily #30 with no refills and will watch for signs of hypotension as Molly [name] continues Molly [name] lifestyle modifications.  Hyperlipidemia Lynnel was informed of the American Heart Association Guidelines emphasizing intensive lifestyle modifications as the first line treatment for hyperlipidemia. We discussed many lifestyle modifications today in depth, and Laranda will continue to work on decreasing saturated fats such as fatty red meat, butter and many fried foods. Molly [name] will also increase vegetables and lean protein in Molly [name] diet and continue to work on exercise and weight loss efforts. Katisha agreed to continue Crestor 10 mg daily #30 with no refills and follow up as directed.  Diabetes II Dynasty has been given extensive diabetes education by myself today including ideal fasting and post-prandial blood glucose readings, individual ideal Hgb A1c goals and hypoglycemia prevention. We discussed the importance of good blood sugar control to decrease the likelihood of diabetic complications such as nephropathy, neuropathy, limb loss, blindness, coronary artery disease, and death. We discussed the importance of intensive lifestyle modification including diet, exercise  and weight loss as the first line treatment for diabetes. Karianne agrees to continue metformin 500 mg 2 tablets in the morning and 1 tablet in the evening (refill not needed today) and will follow up at the agreed upon time.  Obesity Kathya is currently in the action stage of change. As such,  Molly [name] goal is to continue with weight loss efforts Molly [name] has agreed to follow the Category 1 plan Briante has been instructed to work up to a goal of 150 minutes of combined cardio and strengthening exercise per week or resistance training for 5 minutes 2 times per week for weight loss and overall health benefits. We discussed the following Behavioral Modification Strategies today: increase H2O intake, better snacking choices, planning for success, increasing lean protein intake and work on meal planning and easy cooking plans  Jacki has agreed to follow up with our clinic in 2 weeks. Molly [name] was informed of the importance of frequent follow up visits to maximize Molly [name] success with intensive lifestyle modifications for Molly [name] multiple health conditions.   OBESITY BEHAVIORAL INTERVENTION VISIT  Today's visit was # 6 out of 22.  Starting weight: 223 lbs Starting date: 08/31/17 Today's weight : 214 lbs Today's date: 11/16/2017 Total lbs lost to date: 9 (Patients must lose 7 lbs in the first 6 months to continue with counseling)   ASK: We discussed the diagnosis of obesity with Kerry Hough today and Darcell agreed to give [name] permission to discuss obesity behavioral modification therapy today.  ASSESS: Laruen has the diagnosis of obesity and Molly [name] BMI today is 38.0 Lauriana is in the action stage of change   ADVISE: Thanvi was educated on the multiple health risks of obesity as well as the benefit of weight loss to improve Molly [name] health. Molly [name] was advised of the need for long term treatment and the importance of lifestyle modifications.  AGREE: Multiple dietary modification options and treatment options were discussed and  Chinwe agreed to the above obesity treatment plan.  I, Doreene Nest, am acting as transcriptionist for Eber Jones, MD  I have reviewed the above documentation for accuracy and completeness, and I agree with the above. - Ilene Qua, MD

## 2017-12-02 ENCOUNTER — Ambulatory Visit (INDEPENDENT_AMBULATORY_CARE_PROVIDER_SITE_OTHER): Payer: Medicare HMO | Admitting: Family Medicine

## 2017-12-02 VITALS — BP 128/75 | HR 93 | Temp 97.9°F | Ht 63.0 in | Wt 213.0 lb

## 2017-12-02 DIAGNOSIS — E1169 Type 2 diabetes mellitus with other specified complication: Secondary | ICD-10-CM | POA: Diagnosis not present

## 2017-12-02 DIAGNOSIS — I10 Essential (primary) hypertension: Secondary | ICD-10-CM

## 2017-12-02 DIAGNOSIS — Z6837 Body mass index (BMI) 37.0-37.9, adult: Secondary | ICD-10-CM

## 2017-12-06 NOTE — Progress Notes (Signed)
Office: [phone]  /  Fax: [phone]   HPI:   Chief Complaint: OBESITY Molly [name] is here to discuss her progress with her obesity treatment plan. She is on the Category 1 plan and is following her eating plan approximately 50 % of the time. She states she is doing bands for 5 minutes 2 times per week. Jaimy just returned from vacation and really enjoyed it but didn't follow plan but got back on track when she returned.  Her weight is 213 lb (96.6 kg) today and has had a weight loss of 1 pound over a period of 2 weeks since her last visit. She has lost 20 lbs since starting treatment with [name].  Hypertension Molly [name] is a 72 y.o. female with hypertension. Molly [name]'s blood pressure is controlled today and she denies chest pain, chest pressure, or headache. She is working weight loss to help control her blood pressure with the goal of decreasing her risk of heart attack and stroke.   Diabetes II Molly [name] has a diagnosis of diabetes type II. Molly [name] denies carbohydrate cravings and she denies any hypoglycemic episodes. Last A1c was 6.5 on 08/31/17. She has been working on intensive lifestyle modifications including diet, exercise, and weight loss to help control her blood glucose levels.  ALLERGIES: No Known Allergies  MEDICATIONS: Current Outpatient Medications on File Prior to Visit  Medication Sig Dispense Refill  . amLODipine (NORVASC) 10 MG tablet Take 1 tablet (10 mg total) by mouth daily. 30 tablet 0  . Cholecalciferol (VITAMIN D3) 5000 units CAPS Take 1 capsule by mouth daily.    [name] co-enzyme Q-10 30 MG capsule Take 30 mg by mouth 3 (three) times daily.    . diazepam (VALIUM) 5 MG tablet Take 0.5 tablets (2.5 mg total) by mouth every 8 (eight) hours as needed (vertigo/dizziness). 10 tablet 0  . escitalopram (LEXAPRO) 20 MG tablet     . fluticasone (FLONASE) 50 MCG/ACT nasal spray Place 2 sprays into both nostrils daily as needed for allergies or rhinitis. 16 g 5  . furosemide (LASIX) 20 MG  tablet Take 20 mg by mouth as needed.     [name] levocetirizine (XYZAL) 5 MG tablet Take 1 tablet (5 mg total) by mouth every evening. 30 tablet 5  . losartan-hydrochlorothiazide (HYZAAR) 100-25 MG tablet Take 1 tablet by mouth daily. 30 tablet 0  . metFORMIN (GLUCOPHAGE) 500 MG tablet Take 2 in the AM and 1 in the PM 90 tablet 0  . Multiple Vitamins-Minerals (MULTIVITAMIN ADULT PO) Take 1 tablet by mouth daily.    . Omega-3 Fatty Acids (FISH OIL PO) Take 1 tablet by mouth daily.    . ondansetron (ZOFRAN ODT) 4 MG disintegrating tablet Take 1 tablet (4 mg total) by mouth every 8 (eight) hours as needed for nausea or vomiting. 10 tablet 0  . rosuvastatin (CRESTOR) 10 MG tablet Take 1 tablet (10 mg total) by mouth daily. 30 tablet 0   No current facility-administered medications on file prior to visit.     PAST MEDICAL HISTORY: Past Medical History:  Diagnosis Date  . Anxiety   . Constipation   . Depression   . Diabetes type 2, controlled ([place])   . Diarrhea   . Fatigue   . Frequent urination   . GERD (gastroesophageal reflux disease)   . Hyperlipidemia   . Hypertension   . Kidney disease   . Leg cramps   . Multiple food allergies   . Muscle stiffness   . Seasonal allergies   .  Shortness of breath   . Sinus pain   . Swallowing difficulty   . Swelling of extremity   . Wheezing     PAST SURGICAL HISTORY: Past Surgical History:  Procedure Laterality Date  . CHOLECYSTECTOMY  1999  . FOOT SURGERY    . MASTECTOMY      SOCIAL HISTORY: Social History   Tobacco Use  . Smoking status: Former Smoker    Types: Cigarettes    Last attempt to quit: 03/23/1998    Years since quitting: 19.7  . Smokeless tobacco: Never Used  Substance Use Topics  . Alcohol use: Yes    Comment: occasional  . Drug use: No    FAMILY HISTORY: Family History  Problem Relation Age of Onset  . Allergic rhinitis Sister   . Eczema Grandchild   . Eczema Daughter   . Urticaria Daughter   . Lupus Other     . Hypertension Mother   . Stroke Mother   . Obesity Mother   . Hypertension Father   . Heart disease Father   . Sudden death Father   . Asthma Neg Hx   . Atopy Neg Hx   . Angioedema Neg Hx     ROS: Review of Systems  Constitutional: Positive for weight loss.  Cardiovascular: Negative for chest pain.       Negative chest pressure  Neurological: Negative for headaches.  Endo/Heme/Allergies:       Negative hypoglycemia    PHYSICAL EXAM: Blood pressure 128/75, pulse 93, temperature 97.9 F (36.6 C), temperature source Oral, height 5\' 3"  (1.6 m), weight 213 lb (96.6 kg), SpO2 98 %. Body mass index is 37.73 kg/m. Physical Exam  Constitutional: She is oriented to person, place, and time. She appears well-developed and well-nourished.  Cardiovascular: Normal rate.  Pulmonary/Chest: Effort normal.  Musculoskeletal: Normal range of motion.  Neurological: She is oriented to person, place, and time.  Skin: Skin is warm and dry.  Psychiatric: She has a normal mood and affect. Her behavior is normal.  Vitals reviewed.   RECENT LABS AND TESTS: BMET    Component Value Date/Time   NA 138 09/12/2017 2118   NA 143 08/31/2017 1140   K 3.4 (L) 09/12/2017 2118   CL 100 (L) 09/12/2017 2118   CO2 27 09/12/2017 2118   GLUCOSE 134 (H) 09/12/2017 2118   BUN 14 09/12/2017 2118   BUN 8 08/31/2017 1140   CREATININE 0.94 09/12/2017 2118   CREATININE 0.90 03/23/2016 1116   CALCIUM 10.3 09/12/2017 2118   GFRNONAA 60 (L) 09/12/2017 2118   GFRAA >60 09/12/2017 2118   Lab Results  Component Value Date   HGBA1C 6.5 (H) 08/31/2017   HGBA1C (H) 08/31/2010    6.0 (NOTE)                                                                       According to the ADA Clinical Practice Recommendations for 2011, when HbA1c is used as a screening test:   >=6.5%   Diagnostic of Diabetes Mellitus           (if abnormal result  is confirmed)  5.7-6.4%   Increased risk of developing Diabetes Mellitus   References:Diagnosis and Classification of Diabetes Mellitus,Diabetes TFTD,3220,25(KYHCW  1):S62-S69 and Standards of Medical Care in         Diabetes - 2011,Diabetes WUJW,1191,47  (Suppl 1):S11-S61.   Lab Results  Component Value Date   INSULIN 20.4 08/31/2017   CBC    Component Value Date/Time   WBC 4.4 09/12/2017 2118   RBC 4.72 09/12/2017 2118   HGB 15.0 09/12/2017 2118   HGB 14.0 08/31/2017 1140   HCT 43.7 09/12/2017 2118   HCT 41.2 08/31/2017 1140   PLT 173 09/12/2017 2118   MCV 92.6 09/12/2017 2118   MCV 92 08/31/2017 1140   MCH 31.8 09/12/2017 2118   MCHC 34.3 09/12/2017 2118   RDW 13.1 09/12/2017 2118   RDW 13.7 08/31/2017 1140   LYMPHSABS 1.7 08/31/2017 1140   MONOABS 324 03/23/2016 1116   EOSABS 0.1 08/31/2017 1140   BASOSABS 0.0 08/31/2017 1140   Iron/TIBC/Ferritin/ %Sat No results found for: IRON, TIBC, FERRITIN, IRONPCTSAT Lipid Panel     Component Value Date/Time   CHOL 150 08/31/2017 1140   TRIG 105 08/31/2017 1140   HDL 66 08/31/2017 1140   CHOLHDL 2.6 08/31/2010 0540   VLDL 12 08/31/2010 0540   LDLCALC 63 08/31/2017 1140   Hepatic Function Panel     Component Value Date/Time   PROT 7.6 08/31/2017 1140   ALBUMIN 4.9 (H) 08/31/2017 1140   AST 18 08/31/2017 1140   ALT 16 08/31/2017 1140   ALKPHOS 56 08/31/2017 1140   BILITOT 0.3 08/31/2017 1140      Component Value Date/Time   TSH 1.680 08/31/2017 1140   TSH 1.57 03/23/2016 1116    ASSESSMENT AND PLAN: Essential hypertension  Controlled type 2 diabetes mellitus with other specified complication, without long-term current use of insulin (HCC)  Class 2 severe obesity with serious comorbidity and body mass index (BMI) of 37.0 to 37.9 in adult, unspecified obesity type ([place])  PLAN:  Hypertension We discussed sodium restriction, working on healthy weight loss, and a regular exercise program as the means to achieve improved blood pressure control. Abbi agreed with this plan and agreed to  follow up as directed. We will continue to monitor her blood pressure as well as her progress with the above lifestyle modifications. She will continue her medications as prescribed and will watch for signs of hypotension as she continues her lifestyle modifications. Taimi agrees to follow up with our clinic in 2 weeks.  Diabetes II Sterling has been given extensive diabetes education by myself today including ideal fasting and post-prandial blood glucose readings, individual ideal Hgb A1c goals and hypoglycemia prevention. We discussed the importance of good blood sugar control to decrease the likelihood of diabetic complications such as nephropathy, neuropathy, limb loss, blindness, coronary artery disease, and death. We discussed the importance of intensive lifestyle modification including diet, exercise and weight loss as the first line treatment for diabetes. Jaxon agrees to continue metformin and she agrees to follow up with our clinic in 2 weeks.  We spent > than 50% of the 15 minute visit on the counseling as documented in the note.  Obesity Molly [name] is currently in the action stage of change. As such, her goal is to continue with weight loss efforts She has agreed to follow the Category 1 plan Molly [name] has been instructed to work up to a goal of 150 minutes of combined cardio and strengthening exercise per week for weight loss and overall health benefits. We discussed the following Behavioral Modification Strategies today: increasing lean protein intake, increasing vegetables, work on meal planning  and easy cooking plans, and planning for success   Molly [name] has agreed to follow up with our clinic in 2 weeks. She was informed of the importance of frequent follow up visits to maximize her success with intensive lifestyle modifications for her multiple health conditions.   OBESITY BEHAVIORAL INTERVENTION VISIT  Today's visit was # 7 out of 22.  Starting weight: 233 lbs Starting date: 08/31/17 Today's  weight : 213 lbs  Today's date: 12/02/2017 Total lbs lost to date: 20 (Patients must lose 7 lbs in the first 6 months to continue with counseling)   ASK: We discussed the diagnosis of obesity with Molly [name] today and Molly [name] agreed to give [name] permission to discuss obesity behavioral modification therapy today.  ASSESS: Molly [name] has the diagnosis of obesity and her BMI today is 37.74 Molly [name] is in the action stage of change   ADVISE: Molly [name] was educated on the multiple health risks of obesity as well as the benefit of weight loss to improve her health. She was advised of the need for long term treatment and the importance of lifestyle modifications.  AGREE: Multiple dietary modification options and treatment options were discussed and  Molly [name] agreed to the above obesity treatment plan.  I, Trixie Dredge, am acting as transcriptionist for Ilene Qua, MD  I have reviewed the above documentation for accuracy and completeness, and I agree with the above. - Ilene Qua, MD

## 2017-12-21 ENCOUNTER — Ambulatory Visit (INDEPENDENT_AMBULATORY_CARE_PROVIDER_SITE_OTHER): Payer: Medicare HMO | Admitting: Family Medicine

## 2017-12-21 VITALS — BP 117/76 | HR 82 | Temp 97.9°F | Ht 63.0 in | Wt 214.0 lb

## 2017-12-21 DIAGNOSIS — E119 Type 2 diabetes mellitus without complications: Secondary | ICD-10-CM | POA: Diagnosis not present

## 2017-12-21 DIAGNOSIS — I10 Essential (primary) hypertension: Secondary | ICD-10-CM | POA: Diagnosis not present

## 2017-12-21 DIAGNOSIS — Z6838 Body mass index (BMI) 38.0-38.9, adult: Secondary | ICD-10-CM | POA: Diagnosis not present

## 2017-12-21 MED ORDER — LOSARTAN POTASSIUM-HCTZ 100-25 MG PO TABS: 1.0000 | ORAL_TABLET | Freq: Every day | ORAL | 0 refills | Status: DC

## 2017-12-21 NOTE — Progress Notes (Signed)
Office: [phone]  /  Fax: [phone]   HPI:   Chief Complaint: OBESITY Molly [name] is here to discuss her progress with her obesity treatment plan. She is on the Category 1 plan and is following her eating plan approximately 50 % of the time. She states she is doing exercise bands for 5 to 10 minutes 2 times per week. Molly [name] had lots of celebration eating and eating out. She has no upcoming celebrations or travel. Her weight is 214 lb (97.1 kg) today and has had a weight gain of 1 pound over a period of 2 weeks since her last visit. She has lost 9 lbs since starting treatment with [name].  Hypertension Molly [name] is a 72 y.o. female with hypertension. Molly [name] denies chest pain, chest pressure or headache. She has occasional dizziness, but she reports it's related to vertigo. She is working weight loss to help control her blood pressure with the goal of decreasing her risk of heart attack and stroke. [name] blood pressure is is controlled today.  Diabetes II Molly [name] has a diagnosis of diabetes type II. Molly [name] has no GI side effects of metformin and she denies any hypoglycemic episodes. Last A1c was at 6.5 She has been working on intensive lifestyle modifications including diet, exercise, and weight loss to help control her blood glucose levels.  ALLERGIES: No Known Allergies  MEDICATIONS: Current Outpatient Medications on File Prior to Visit  Medication Sig Dispense Refill  . amLODipine (NORVASC) 10 MG tablet Take 1 tablet (10 mg total) by mouth daily. 30 tablet 0  . Cholecalciferol (VITAMIN D3) 5000 units CAPS Take 1 capsule by mouth daily.    [name] co-enzyme Q-10 30 MG capsule Take 30 mg by mouth 3 (three) times daily.    . diazepam (VALIUM) 5 MG tablet Take 0.5 tablets (2.5 mg total) by mouth every 8 (eight) hours as needed (vertigo/dizziness). 10 tablet 0  . escitalopram (LEXAPRO) 20 MG tablet     . fluticasone (FLONASE) 50 MCG/ACT nasal spray Place 2 sprays into both nostrils daily as needed  for allergies or rhinitis. 16 g 5  . furosemide (LASIX) 20 MG tablet Take 20 mg by mouth as needed.     [name] levocetirizine (XYZAL) 5 MG tablet Take 1 tablet (5 mg total) by mouth every evening. 30 tablet 5  . losartan-hydrochlorothiazide (HYZAAR) 100-25 MG tablet Take 1 tablet by mouth daily. 30 tablet 0  . metFORMIN (GLUCOPHAGE) 500 MG tablet Take 2 in the AM and 1 in the PM 90 tablet 0  . Multiple Vitamins-Minerals (MULTIVITAMIN ADULT PO) Take 1 tablet by mouth daily.    . Omega-3 Fatty Acids (FISH OIL PO) Take 1 tablet by mouth daily.    . ondansetron (ZOFRAN ODT) 4 MG disintegrating tablet Take 1 tablet (4 mg total) by mouth every 8 (eight) hours as needed for nausea or vomiting. 10 tablet 0  . rosuvastatin (CRESTOR) 10 MG tablet Take 1 tablet (10 mg total) by mouth daily. 30 tablet 0   No current facility-administered medications on file prior to visit.     PAST MEDICAL HISTORY: Past Medical History:  Diagnosis Date  . Anxiety   . Constipation   . Depression   . Diabetes type 2, controlled (Molly [name])   . Diarrhea   . Fatigue   . Frequent urination   . GERD (gastroesophageal reflux disease)   . Hyperlipidemia   . Hypertension   . Kidney disease   . Leg cramps   . Multiple food  allergies   . Muscle stiffness   . Seasonal allergies   . Shortness of breath   . Sinus pain   . Swallowing difficulty   . Swelling of extremity   . Wheezing     PAST SURGICAL HISTORY: Past Surgical History:  Procedure Laterality Date  . CHOLECYSTECTOMY  1999  . FOOT SURGERY    . MASTECTOMY      SOCIAL HISTORY: Social History   Tobacco Use  . Smoking status: Former Smoker    Types: Cigarettes    Last attempt to quit: 03/23/1998    Years since quitting: 19.7  . Smokeless tobacco: Never Used  Substance Use Topics  . Alcohol use: Yes    Comment: occasional  . Drug use: No    FAMILY HISTORY: Family History  Problem Relation Age of Onset  . Allergic rhinitis Sister   . Eczema Grandchild     . Eczema Daughter   . Urticaria Daughter   . Lupus Other   . Hypertension Mother   . Stroke Mother   . Obesity Mother   . Hypertension Father   . Heart disease Father   . Sudden death Father   . Asthma Neg Hx   . Atopy Neg Hx   . Angioedema Neg Hx     ROS: Review of Systems  Constitutional: Negative for weight loss.  Cardiovascular: Negative for chest pain.       Negative for chest pressure  Gastrointestinal: Negative for diarrhea, nausea and vomiting.  Neurological: Positive for dizziness. Negative for headaches.  Endo/Heme/Allergies:       Negative for hypoglycemia    PHYSICAL EXAM: Pulse 82, temperature 97.9 F (36.6 C), temperature source Oral, height 5\' 3"  (1.6 m), weight 214 lb (97.1 kg), SpO2 98 %. Body mass index is 37.91 kg/m. Physical Exam  Constitutional: She is oriented to person, place, and time. She appears well-developed and well-nourished.  Cardiovascular: Normal rate.  Pulmonary/Chest: Effort normal.  Musculoskeletal: Normal range of motion.  Neurological: She is oriented to person, place, and time.  Skin: Skin is warm and dry.  Psychiatric: She has a normal mood and affect. Her behavior is normal.  Vitals reviewed.   RECENT LABS AND TESTS: BMET    Component Value Date/Time   NA 138 09/12/2017 2118   NA 143 08/31/2017 1140   K 3.4 (L) 09/12/2017 2118   CL 100 (L) 09/12/2017 2118   CO2 27 09/12/2017 2118   GLUCOSE 134 (H) 09/12/2017 2118   BUN 14 09/12/2017 2118   BUN 8 08/31/2017 1140   CREATININE 0.94 09/12/2017 2118   CREATININE 0.90 03/23/2016 1116   CALCIUM 10.3 09/12/2017 2118   GFRNONAA 60 (L) 09/12/2017 2118   GFRAA >60 09/12/2017 2118   Lab Results  Component Value Date   HGBA1C 6.5 (H) 08/31/2017   HGBA1C (H) 08/31/2010    6.0 (NOTE)                                                                       According to the ADA Clinical Practice Recommendations for 2011, when HbA1c is used as a screening test:   >=6.5%    Diagnostic of Diabetes Mellitus           (  if abnormal result  is confirmed)  5.7-6.4%   Increased risk of developing Diabetes Mellitus  References:Diagnosis and Classification of Diabetes Mellitus,Diabetes RSWN,4627,03(JKKXF 1):S62-S69 and Standards of Medical Care in         Diabetes - 2011,Diabetes GHWE,9937,16  (Suppl 1):S11-S61.   Lab Results  Component Value Date   INSULIN 20.4 08/31/2017   CBC    Component Value Date/Time   WBC 4.4 09/12/2017 2118   RBC 4.72 09/12/2017 2118   HGB 15.0 09/12/2017 2118   HGB 14.0 08/31/2017 1140   HCT 43.7 09/12/2017 2118   HCT 41.2 08/31/2017 1140   PLT 173 09/12/2017 2118   MCV 92.6 09/12/2017 2118   MCV 92 08/31/2017 1140   MCH 31.8 09/12/2017 2118   MCHC 34.3 09/12/2017 2118   RDW 13.1 09/12/2017 2118   RDW 13.7 08/31/2017 1140   LYMPHSABS 1.7 08/31/2017 1140   MONOABS 324 03/23/2016 1116   EOSABS 0.1 08/31/2017 1140   BASOSABS 0.0 08/31/2017 1140   Iron/TIBC/Ferritin/ %Sat No results found for: IRON, TIBC, FERRITIN, IRONPCTSAT Lipid Panel     Component Value Date/Time   CHOL 150 08/31/2017 1140   TRIG 105 08/31/2017 1140   HDL 66 08/31/2017 1140   CHOLHDL 2.6 08/31/2010 0540   VLDL 12 08/31/2010 0540   LDLCALC 63 08/31/2017 1140   Hepatic Function Panel     Component Value Date/Time   PROT 7.6 08/31/2017 1140   ALBUMIN 4.9 (H) 08/31/2017 1140   AST 18 08/31/2017 1140   ALT 16 08/31/2017 1140   ALKPHOS 56 08/31/2017 1140   BILITOT 0.3 08/31/2017 1140      Component Value Date/Time   TSH 1.680 08/31/2017 1140   TSH 1.57 03/23/2016 1116   Results for JANIE, CAPP (MRN [id number]) as of 12/21/2017 11:40  Ref. Range 08/31/2017 11:40  Vitamin D, 25-Hydroxy Latest Ref Range: 30.0 - 100.0 ng/mL 49.8   ASSESSMENT AND PLAN: Essential hypertension - Plan: Comprehensive metabolic panel, losartan-hydrochlorothiazide (HYZAAR) 100-25 MG tablet  Type 2 diabetes mellitus without complication, without long-term current use of  insulin (HCC) - Plan: Hemoglobin A1c, Insulin, random  Class 2 severe obesity with serious comorbidity and body mass index (BMI) of 38.0 to 38.9 in adult, unspecified obesity type ([place])  PLAN:  Hypertension We discussed sodium restriction, working on healthy weight loss, and a regular exercise program as the means to achieve improved blood pressure control. Tammi agreed with this plan and agreed to follow up as directed. We will continue to monitor her blood pressure as well as her progress with the above lifestyle modifications. She agrees to continue losartan-HCTZ daily #30 with no refills and will watch for signs of hypotension as she continues her lifestyle modifications.  Diabetes II Tramaine has been given extensive diabetes education by myself today including ideal fasting and post-prandial blood glucose readings, individual ideal Hgb A1c goals and hypoglycemia prevention. We discussed the importance of good blood sugar control to decrease the likelihood of diabetic complications such as nephropathy, neuropathy, limb loss, blindness, coronary artery disease, and death. We discussed the importance of intensive lifestyle modification including diet, exercise and weight loss as the first line treatment for diabetes. We will check Hgb A1c and insulin today. Robina agrees to continue metformin and will follow up at the agreed upon time.  Obesity Tametha is currently in the action stage of change. As such, her goal is to continue with weight loss efforts She has agreed to follow the Category 1 plan Charmaine has been  instructed to work up to a goal of 150 minutes of combined cardio and strengthening exercise per week or increase resistance training to 3 times per week for weight loss and overall health benefits. We discussed the following Behavioral Modification Strategies today: planning for success, increasing lean protein intake, increasing vegetables, decrease eating out and work on meal planning and easy  cooking plans  Vernee has agreed to follow up with our clinic in 2 weeks. She was informed of the importance of frequent follow up visits to maximize her success with intensive lifestyle modifications for her multiple health conditions.   OBESITY BEHAVIORAL INTERVENTION VISIT  Today's visit was # 8 out of 22.  Starting weight: 223 lbs Starting date: 08/31/17 Today's weight : 214 lbs Today's date: 12/21/2017 Total lbs lost to date: 9 (Patients must lose 7 lbs in the first 6 months to continue with counseling)   ASK: We discussed the diagnosis of obesity with Kerry Hough today and [place] agreed to give [name] permission to discuss obesity behavioral modification therapy today.  ASSESS: Libra has the diagnosis of obesity and her BMI today is 37.92 Mirai is in the action stage of change   ADVISE: Chelan was educated on the multiple health risks of obesity as well as the benefit of weight loss to improve her health. She was advised of the need for long term treatment and the importance of lifestyle modifications.  AGREE: Multiple dietary modification options and treatment options were discussed and  Zerina agreed to the above obesity treatment plan.  I, Doreene Nest, am acting as transcriptionist for Eber Jones, MD  I have reviewed the above documentation for accuracy and completeness, and I agree with the above. - Ilene Qua, MD

## 2017-12-22 LAB — COMPREHENSIVE METABOLIC PANEL
A/G RATIO: 1.7 (ref 1.2–2.2)
ALBUMIN: 4.7 g/dL (ref 3.5–4.8)
ALT: 9 IU/L (ref 0–32)
AST: 11 IU/L (ref 0–40)
Alkaline Phosphatase: 53 IU/L (ref 39–117)
BILIRUBIN TOTAL: 0.3 mg/dL (ref 0.0–1.2)
BUN / CREAT RATIO: 11 — AB (ref 12–28)
BUN: 10 mg/dL (ref 8–27)
CHLORIDE: 102 mmol/L (ref 96–106)
CO2: 24 mmol/L (ref 20–29)
Calcium: 10.4 mg/dL — ABNORMAL HIGH (ref 8.7–10.3)
Creatinine, Ser: 0.95 mg/dL (ref 0.57–1.00)
GFR calc non Af Amer: 60 mL/min/{1.73_m2} (ref >59)
GFR, EST AFRICAN AMERICAN: 69 mL/min/{1.73_m2} (ref >59)
Globulin, Total: 2.8 g/dL (ref 1.5–4.5)
Glucose: 114 mg/dL — ABNORMAL HIGH (ref 65–99)
POTASSIUM: 3.8 mmol/L (ref 3.5–5.2)
Sodium: 143 mmol/L (ref 134–144)
TOTAL PROTEIN: 7.5 g/dL (ref 6.0–8.5)

## 2017-12-22 LAB — HEMOGLOBIN A1C
Est. average glucose Bld gHb Est-mCnc: 126 mg/dL
Hgb A1c MFr Bld: 6 % — ABNORMAL HIGH (ref 4.8–5.6)

## 2017-12-22 LAB — INSULIN, RANDOM: INSULIN: 17.7 u[IU]/mL (ref 2.6–24.9)

## 2018-01-11 ENCOUNTER — Ambulatory Visit (INDEPENDENT_AMBULATORY_CARE_PROVIDER_SITE_OTHER): Payer: Medicare HMO | Admitting: Family Medicine

## 2018-01-11 VITALS — BP 124/73 | HR 84 | Temp 98.2°F | Ht 63.0 in | Wt 213.0 lb

## 2018-01-11 DIAGNOSIS — E119 Type 2 diabetes mellitus without complications: Secondary | ICD-10-CM | POA: Diagnosis not present

## 2018-01-11 DIAGNOSIS — I10 Essential (primary) hypertension: Secondary | ICD-10-CM

## 2018-01-11 DIAGNOSIS — Z6837 Body mass index (BMI) 37.0-37.9, adult: Secondary | ICD-10-CM

## 2018-01-11 NOTE — Progress Notes (Signed)
Office: [phone]  /  Fax: [phone]   HPI:   Chief Complaint: OBESITY Molly [name] is here to discuss her progress with her obesity treatment plan. She is on the Category 1 plan and is following her eating plan approximately 85 % of the time. She states she is doing the resistance band for 1 to 2 minutes 2 times per week. Molly [name]'s biggest struggle in the past couple of weeks was being in the house with her sick grand baby. She also had graduation dinners. Molly [name] has no travel or celebrations in the next 2 weeks. Her weight is 213 lb (96.6 kg) today and has had a weight loss of 1 pound over a period of 3 weeks since her last visit. She has lost 10 lbs since starting treatment with [name].  Diabetes II Molly [name] has a diagnosis of diabetes type II. Molly [name] denies any hypoglycemic episodes. Last A1c was at 6.0 (improved from 6.5) She has been working on intensive lifestyle modifications including diet, exercise, and weight loss to help control her blood glucose levels.  Hypertension Molly [name] is a 72 y.o. female with hypertension.  Molly [name] denies chest pain, chest pressure or headache. She is working weight loss to help control her blood pressure with the goal of decreasing her risk of heart attack and stroke. Molly [name] blood pressure is controlled today.  ALLERGIES: No Known Allergies  MEDICATIONS: Current Outpatient Medications on File Prior to Visit  Medication Sig Dispense Refill  . amLODipine (NORVASC) 10 MG tablet Take 1 tablet (10 mg total) by mouth daily. 30 tablet 0  . Cholecalciferol (VITAMIN D3) 5000 units CAPS Take 1 capsule by mouth daily.    [name] co-enzyme Q-10 30 MG capsule Take 30 mg by mouth 3 (three) times daily.    . diazepam (VALIUM) 5 MG tablet Take 0.5 tablets (2.5 mg total) by mouth every 8 (eight) hours as needed (vertigo/dizziness). 10 tablet 0  . escitalopram (LEXAPRO) 20 MG tablet     . fluticasone (FLONASE) 50 MCG/ACT nasal spray Place 2 sprays into both nostrils daily as needed  for allergies or rhinitis. 16 g 5  . furosemide (LASIX) 20 MG tablet Take 20 mg by mouth as needed.     [name] levocetirizine (XYZAL) 5 MG tablet Take 1 tablet (5 mg total) by mouth every evening. 30 tablet 5  . losartan-hydrochlorothiazide (HYZAAR) 100-25 MG tablet Take 1 tablet by mouth daily. 30 tablet 0  . metFORMIN (GLUCOPHAGE) 500 MG tablet Take 2 in the AM and 1 in the PM 90 tablet 0  . Multiple Vitamins-Minerals (MULTIVITAMIN ADULT PO) Take 1 tablet by mouth daily.    . Omega-3 Fatty Acids (FISH OIL PO) Take 1 tablet by mouth daily.    . ondansetron (ZOFRAN ODT) 4 MG disintegrating tablet Take 1 tablet (4 mg total) by mouth every 8 (eight) hours as needed for nausea or vomiting. 10 tablet 0  . rosuvastatin (CRESTOR) 10 MG tablet Take 1 tablet (10 mg total) by mouth daily. 30 tablet 0   No current facility-administered medications on file prior to visit.     PAST MEDICAL HISTORY: Past Medical History:  Diagnosis Date  . Anxiety   . Constipation   . Depression   . Diabetes type 2, controlled ([place])   . Diarrhea   . Fatigue   . Frequent urination   . GERD (gastroesophageal reflux disease)   . Hyperlipidemia   . Hypertension   . Kidney disease   . Leg cramps   .  Multiple food allergies   . Muscle stiffness   . Seasonal allergies   . Shortness of breath   . Sinus pain   . Swallowing difficulty   . Swelling of extremity   . Wheezing     PAST SURGICAL HISTORY: Past Surgical History:  Procedure Laterality Date  . CHOLECYSTECTOMY  1999  . FOOT SURGERY    . MASTECTOMY      SOCIAL HISTORY: Social History   Tobacco Use  . Smoking status: Former Smoker    Types: Cigarettes    Last attempt to quit: 03/23/1998    Years since quitting: 19.8  . Smokeless tobacco: Never Used  Substance Use Topics  . Alcohol use: Yes    Comment: occasional  . Drug use: No    FAMILY HISTORY: Family History  Problem Relation Age of Onset  . Allergic rhinitis Sister   . Eczema Grandchild     . Eczema Daughter   . Urticaria Daughter   . Lupus Other   . Hypertension Mother   . Stroke Mother   . Obesity Mother   . Hypertension Father   . Heart disease Father   . Sudden death Father   . Asthma Neg Hx   . Atopy Neg Hx   . Angioedema Neg Hx     ROS: Review of Systems  Constitutional: Positive for weight loss.  Cardiovascular: Negative for chest pain.       Negative for chest pressure  Neurological: Negative for headaches.  Endo/Heme/Allergies:       Negative for hypoglycemia    PHYSICAL EXAM: Blood pressure 124/73, pulse 84, temperature 98.2 F (36.8 C), height 5\' 3"  (1.6 m), weight 213 lb (96.6 kg), SpO2 98 %. Body mass index is 37.73 kg/m. Physical Exam  Constitutional: She is oriented to person, place, and time. She appears well-developed and well-nourished.  Cardiovascular: Normal rate.  Pulmonary/Chest: Effort normal.  Musculoskeletal: Normal range of motion.  Neurological: She is oriented to person, place, and time.  Skin: Skin is warm and dry.  Psychiatric: She has a normal mood and affect. Her behavior is normal.  Vitals reviewed.   RECENT LABS AND TESTS: BMET    Component Value Date/Time   NA 143 12/21/2017 0942   K 3.8 12/21/2017 0942   CL 102 12/21/2017 0942   CO2 24 12/21/2017 0942   GLUCOSE 114 (H) 12/21/2017 0942   GLUCOSE 134 (H) 09/12/2017 2118   BUN 10 12/21/2017 0942   CREATININE 0.95 12/21/2017 0942   CREATININE 0.90 03/23/2016 1116   CALCIUM 10.4 (H) 12/21/2017 0942   GFRNONAA 60 12/21/2017 0942   GFRAA 69 12/21/2017 0942   Lab Results  Component Value Date   HGBA1C 6.0 (H) 12/21/2017   HGBA1C 6.5 (H) 08/31/2017   HGBA1C (H) 08/31/2010    6.0 (NOTE)                                                                       According to the ADA Clinical Practice Recommendations for 2011, when HbA1c is used as a screening test:   >=6.5%   Diagnostic of Diabetes Mellitus           (if abnormal result  is confirmed)  5.7-6.4%  Increased risk of developing Diabetes Mellitus  References:Diagnosis and Classification of Diabetes Mellitus,Diabetes KVQQ,5956,38(VFIEP 1):S62-S69 and Standards of Medical Care in         Diabetes - 2011,Diabetes PIRJ,1884,16  (Suppl 1):S11-S61.   Lab Results  Component Value Date   INSULIN 17.7 12/21/2017   INSULIN 20.4 08/31/2017   CBC    Component Value Date/Time   WBC 4.4 09/12/2017 2118   RBC 4.72 09/12/2017 2118   HGB 15.0 09/12/2017 2118   HGB 14.0 08/31/2017 1140   HCT 43.7 09/12/2017 2118   HCT 41.2 08/31/2017 1140   PLT 173 09/12/2017 2118   MCV 92.6 09/12/2017 2118   MCV 92 08/31/2017 1140   MCH 31.8 09/12/2017 2118   MCHC 34.3 09/12/2017 2118   RDW 13.1 09/12/2017 2118   RDW 13.7 08/31/2017 1140   LYMPHSABS 1.7 08/31/2017 1140   MONOABS 324 03/23/2016 1116   EOSABS 0.1 08/31/2017 1140   BASOSABS 0.0 08/31/2017 1140   Iron/TIBC/Ferritin/ %Sat No results found for: IRON, TIBC, FERRITIN, IRONPCTSAT Lipid Panel     Component Value Date/Time   CHOL 150 08/31/2017 1140   TRIG 105 08/31/2017 1140   HDL 66 08/31/2017 1140   CHOLHDL 2.6 08/31/2010 0540   VLDL 12 08/31/2010 0540   LDLCALC 63 08/31/2017 1140   Hepatic Function Panel     Component Value Date/Time   PROT 7.5 12/21/2017 0942   ALBUMIN 4.7 12/21/2017 0942   AST 11 12/21/2017 0942   ALT 9 12/21/2017 0942   ALKPHOS 53 12/21/2017 0942   BILITOT 0.3 12/21/2017 0942      Component Value Date/Time   TSH 1.680 08/31/2017 1140   TSH 1.57 03/23/2016 1116   Results for LEROY, TRIM (MRN [id number]) as of 01/11/2018 11:52  Ref. Range 08/31/2017 11:40  Vitamin D, 25-Hydroxy Latest Ref Range: 30.0 - 100.0 ng/mL 49.8   ASSESSMENT AND PLAN: Type 2 diabetes mellitus without complication, without long-term current use of insulin (HCC)  Essential hypertension  Class 2 severe obesity with serious comorbidity and body mass index (BMI) of 37.0 to 37.9 in adult, unspecified obesity type ([place])  PLAN:  Diabetes  II Mayumi has been given extensive diabetes education by myself today including ideal fasting and post-prandial blood glucose readings, individual ideal Hgb A1c goals and hypoglycemia prevention. We discussed the importance of good blood sugar control to decrease the likelihood of diabetic complications such as nephropathy, neuropathy, limb loss, blindness, coronary artery disease, and death. We discussed the importance of intensive lifestyle modification including diet, exercise and weight loss as the first line treatment for diabetes. Molly [name] agrees to continue metformin and will follow up at the agreed upon time.  Hypertension We discussed sodium restriction, working on healthy weight loss, and a regular exercise program as the means to achieve improved blood pressure control. Molly [name] agreed with this plan and agreed to follow up as directed. We will continue to monitor her blood pressure as well as her progress with the above lifestyle modifications. She will continue Hyzaar and Amlodipine as prescribed and will watch for signs of hypotension as she continues her lifestyle modifications.  We spent > than 50% of the 15 minute visit on the counseling as documented in the note.  Obesity Molly [name] is currently in the action stage of change. As such, her goal is to continue with weight loss efforts She has agreed to follow the Category 1 plan Molly [name] has been instructed to work up to a goal of 150 minutes of combined cardio  and strengthening exercise per week for weight loss and overall health benefits. We discussed the following Behavioral Modification Strategies today: planning for success, increasing lean protein intake, increasing vegetables, work on meal planning and easy cooking plans and celebration eating strategies   Molly [name] has agreed to follow up with our clinic in 2 weeks. She was informed of the importance of frequent follow up visits to maximize her success with intensive lifestyle modifications for her  multiple health conditions.   OBESITY BEHAVIORAL INTERVENTION VISIT  Today's visit was # 9 out of 22.  Starting weight: 223 lbs Starting date: 12/21/17 Today's weight : 213 lbs Today's date: 01/11/2018 Total lbs lost to date: 10 (Patients must lose 7 lbs in the first 6 months to continue with counseling)   ASK: We discussed the diagnosis of obesity with Molly [name] today and Molly [name] agreed to give [name] permission to discuss obesity behavioral modification therapy today.  ASSESS: Molly [name] has the diagnosis of obesity and her BMI today is 37.74 Molly [name] is in the action stage of change   ADVISE: Molly [name] was educated on the multiple health risks of obesity as well as the benefit of weight loss to improve her health. She was advised of the need for long term treatment and the importance of lifestyle modifications.  AGREE: Multiple dietary modification options and treatment options were discussed and  Molly [name] agreed to the above obesity treatment plan.  I, Doreene Nest, am acting as transcriptionist for Eber Jones, MD  I have reviewed the above documentation for accuracy and completeness, and I agree with the above. - Ilene Qua, MD

## 2018-01-25 ENCOUNTER — Ambulatory Visit (INDEPENDENT_AMBULATORY_CARE_PROVIDER_SITE_OTHER): Payer: Medicare HMO | Admitting: Family Medicine

## 2018-02-10 ENCOUNTER — Ambulatory Visit (INDEPENDENT_AMBULATORY_CARE_PROVIDER_SITE_OTHER): Payer: Medicare HMO | Admitting: Family Medicine

## 2018-02-10 VITALS — BP 123/79 | HR 83 | Temp 98.2°F | Ht 63.0 in | Wt 207.0 lb

## 2018-02-10 DIAGNOSIS — I10 Essential (primary) hypertension: Secondary | ICD-10-CM

## 2018-02-10 DIAGNOSIS — Z6836 Body mass index (BMI) 36.0-36.9, adult: Secondary | ICD-10-CM | POA: Diagnosis not present

## 2018-02-10 DIAGNOSIS — E119 Type 2 diabetes mellitus without complications: Secondary | ICD-10-CM | POA: Diagnosis not present

## 2018-02-10 NOTE — Progress Notes (Signed)
Office: [phone]  /  Fax: [phone]   HPI:   Chief Complaint: OBESITY Molly [name] is here to discuss her progress with her obesity treatment plan. She is on the Category 1 plan and is following her eating plan approximately 80 % of the time. She states she is exercising with resistance bands for 10-15 minutes 2-3 times per week. Molly [name] has been doing really well on meal plan. Occasional hunger but she occasionally doing extra snacking.  Her weight is 207 lb (93.9 kg) today and has had a weight loss of 6 pounds over a period of 4 weeks since her last visit. She has lost 16 lbs since starting treatment with [name].  Diabetes II Molly [name] has a diagnosis of diabetes type II. Molly [name] is on metformin with no GI side effects and no hunger. She denies any hypoglycemic episodes. Last A1c was 6.0. She has been working on intensive lifestyle modifications including diet, exercise, and weight loss to help control her blood glucose levels.  Hypertension Molly [name] is a 72 y.o. female with hypertension. Molly [name]'s blood pressure is controlled. She denies chest pain, chest pain, or headache. She is working weight loss to help control her blood pressure with the goal of decreasing her risk of heart attack and stroke.  ALLERGIES: No Known Allergies  MEDICATIONS: Current Outpatient Medications on File Prior to Visit  Medication Sig Dispense Refill  . amLODipine (NORVASC) 10 MG tablet Take 1 tablet (10 mg total) by mouth daily. 30 tablet 0  . Cholecalciferol (VITAMIN D3) 5000 units CAPS Take 1 capsule by mouth daily.    [name] co-enzyme Q-10 30 MG capsule Take 30 mg by mouth 3 (three) times daily.    . diazepam (VALIUM) 5 MG tablet Take 0.5 tablets (2.5 mg total) by mouth every 8 (eight) hours as needed (vertigo/dizziness). 10 tablet 0  . escitalopram (LEXAPRO) 20 MG tablet     . fluticasone (FLONASE) 50 MCG/ACT nasal spray Place 2 sprays into both nostrils daily as needed for allergies or rhinitis. 16 g 5  . furosemide  (LASIX) 20 MG tablet Take 20 mg by mouth as needed.     [name] levocetirizine (XYZAL) 5 MG tablet Take 1 tablet (5 mg total) by mouth every evening. 30 tablet 5  . losartan-hydrochlorothiazide (HYZAAR) 100-25 MG tablet Take 1 tablet by mouth daily. 30 tablet 0  . metFORMIN (GLUCOPHAGE) 500 MG tablet Take 2 in the AM and 1 in the PM 90 tablet 0  . Multiple Vitamins-Minerals (MULTIVITAMIN ADULT PO) Take 1 tablet by mouth daily.    . Omega-3 Fatty Acids (FISH OIL PO) Take 1 tablet by mouth daily.    . ondansetron (ZOFRAN ODT) 4 MG disintegrating tablet Take 1 tablet (4 mg total) by mouth every 8 (eight) hours as needed for nausea or vomiting. 10 tablet 0  . rosuvastatin (CRESTOR) 10 MG tablet Take 1 tablet (10 mg total) by mouth daily. 30 tablet 0   No current facility-administered medications on file prior to visit.     PAST MEDICAL HISTORY: Past Medical History:  Diagnosis Date  . Anxiety   . Constipation   . Depression   . Diabetes type 2, controlled ([place])   . Diarrhea   . Fatigue   . Frequent urination   . GERD (gastroesophageal reflux disease)   . Hyperlipidemia   . Hypertension   . Kidney disease   . Leg cramps   . Multiple food allergies   . Muscle stiffness   . Seasonal allergies   .  Shortness of breath   . Sinus pain   . Swallowing difficulty   . Swelling of extremity   . Wheezing     PAST SURGICAL HISTORY: Past Surgical History:  Procedure Laterality Date  . CHOLECYSTECTOMY  1999  . FOOT SURGERY    . MASTECTOMY      SOCIAL HISTORY: Social History   Tobacco Use  . Smoking status: Former Smoker    Types: Cigarettes    Last attempt to quit: 03/23/1998    Years since quitting: 19.9  . Smokeless tobacco: Never Used  Substance Use Topics  . Alcohol use: Yes    Comment: occasional  . Drug use: No    FAMILY HISTORY: Family History  Problem Relation Age of Onset  . Allergic rhinitis Sister   . Eczema Grandchild   . Eczema Daughter   . Urticaria Daughter   .  Lupus Other   . Hypertension Mother   . Stroke Mother   . Obesity Mother   . Hypertension Father   . Heart disease Father   . Sudden death Father   . Asthma Neg Hx   . Atopy Neg Hx   . Angioedema Neg Hx     ROS: Review of Systems  Constitutional: Positive for weight loss.  Cardiovascular: Negative for chest pain.       Negative chest pressure  Neurological: Negative for headaches.  Endo/Heme/Allergies:       Negative hypoglycemia    PHYSICAL EXAM: Blood pressure 123/79, pulse 83, temperature 98.2 F (36.8 C), temperature source Oral, height 5\' 3"  (1.6 m), weight 207 lb (93.9 kg), SpO2 97 %. Body mass index is 36.67 kg/m. Physical Exam  Constitutional: She is oriented to person, place, and time. She appears well-developed and well-nourished.  Cardiovascular: Normal rate.  Pulmonary/Chest: Effort normal.  Musculoskeletal: Normal range of motion.  Neurological: She is oriented to person, place, and time.  Skin: Skin is warm and dry.  Psychiatric: She has a normal mood and affect. Her behavior is normal.  Vitals reviewed.   RECENT LABS AND TESTS: BMET    Component Value Date/Time   NA 143 12/21/2017 0942   K 3.8 12/21/2017 0942   CL 102 12/21/2017 0942   CO2 24 12/21/2017 0942   GLUCOSE 114 (H) 12/21/2017 0942   GLUCOSE 134 (H) 09/12/2017 2118   BUN 10 12/21/2017 0942   CREATININE 0.95 12/21/2017 0942   CREATININE 0.90 03/23/2016 1116   CALCIUM 10.4 (H) 12/21/2017 0942   GFRNONAA 60 12/21/2017 0942   GFRAA 69 12/21/2017 0942   Lab Results  Component Value Date   HGBA1C 6.0 (H) 12/21/2017   HGBA1C 6.5 (H) 08/31/2017   HGBA1C (H) 08/31/2010    6.0 (NOTE)                                                                       According to the ADA Clinical Practice Recommendations for 2011, when HbA1c is used as a screening test:   >=6.5%   Diagnostic of Diabetes Mellitus           (if abnormal result  is confirmed)  5.7-6.4%   Increased risk of developing Diabetes  [place]  References:Diagnosis and Classification of Diabetes Mellitus,Diabetes NKNL,9767,34(LPFXT 1):S62-S69 and  Standards of Medical Care in         Diabetes - 2011,Diabetes EGBT,5176,16  (Suppl 1):S11-S61.   Lab Results  Component Value Date   INSULIN 17.7 12/21/2017   INSULIN 20.4 08/31/2017   CBC    Component Value Date/Time   WBC 4.4 09/12/2017 2118   RBC 4.72 09/12/2017 2118   HGB 15.0 09/12/2017 2118   HGB 14.0 08/31/2017 1140   HCT 43.7 09/12/2017 2118   HCT 41.2 08/31/2017 1140   PLT 173 09/12/2017 2118   MCV 92.6 09/12/2017 2118   MCV 92 08/31/2017 1140   MCH 31.8 09/12/2017 2118   MCHC 34.3 09/12/2017 2118   RDW 13.1 09/12/2017 2118   RDW 13.7 08/31/2017 1140   LYMPHSABS 1.7 08/31/2017 1140   MONOABS 324 03/23/2016 1116   EOSABS 0.1 08/31/2017 1140   BASOSABS 0.0 08/31/2017 1140   Iron/TIBC/Ferritin/ %Sat No results found for: IRON, TIBC, FERRITIN, IRONPCTSAT Lipid Panel     Component Value Date/Time   CHOL 150 08/31/2017 1140   TRIG 105 08/31/2017 1140   HDL 66 08/31/2017 1140   CHOLHDL 2.6 08/31/2010 0540   VLDL 12 08/31/2010 0540   LDLCALC 63 08/31/2017 1140   Hepatic Function Panel     Component Value Date/Time   PROT 7.5 12/21/2017 0942   ALBUMIN 4.7 12/21/2017 0942   AST 11 12/21/2017 0942   ALT 9 12/21/2017 0942   ALKPHOS 53 12/21/2017 0942   BILITOT 0.3 12/21/2017 0942      Component Value Date/Time   TSH 1.680 08/31/2017 1140   TSH 1.57 03/23/2016 1116    ASSESSMENT AND PLAN: Type 2 diabetes mellitus without complication, without long-term current use of insulin (HCC)  Essential hypertension  Class 2 severe obesity with serious comorbidity and body mass index (BMI) of 36.0 to 36.9 in adult, unspecified obesity type ([place])  PLAN:  Diabetes II Karmina has been given extensive diabetes education by myself today including ideal fasting and post-prandial blood glucose readings, individual ideal Hgb A1c goals and hypoglycemia prevention.  We discussed the importance of good blood sugar control to decrease the likelihood of diabetic complications such as nephropathy, neuropathy, limb loss, blindness, coronary artery disease, and death. We discussed the importance of intensive lifestyle modification including diet, exercise and weight loss as the first line treatment for diabetes. Chaneka agrees to continue taking metformin and she agrees to follow up with our clinic in 2 weeks.  Hypertension We discussed sodium restriction, working on healthy weight loss, and a regular exercise program as the means to achieve improved blood pressure control. Mariachristina agreed with this plan and agreed to follow up as directed. We will continue to monitor her blood pressure as well as her progress with the above lifestyle modifications. Anabell agrees to continue her medications and will watch for signs of hypotension as she continues her lifestyle modifications. Becki agrees to follow up with our clinic in 2 weeks.  We spent > than 50% of the 15 minute visit on the counseling as documented in the note.  Obesity Ayako is currently in the action stage of change. As such, her goal is to continue with weight loss efforts She has agreed to follow the Category 1 plan Breeonna has been instructed to work up to a goal of 150 minutes of combined cardio and strengthening exercise per week for weight loss and overall health benefits. We discussed the following Behavioral Modification Strategies today: increasing lean protein intake, increasing vegetables, work on meal planning and  easy cooking plans, better snacking choices, and planning for success   Zania has agreed to follow up with our clinic in 2 weeks. She was informed of the importance of frequent follow up visits to maximize her success with intensive lifestyle modifications for her multiple health conditions.   OBESITY BEHAVIORAL INTERVENTION VISIT  Today's visit was # 10 out of 22.  Starting weight: 223  lbs Starting date: 08/31/17 Today's weight : 207 lbs  Today's date: 02/10/2018 Total lbs lost to date: 16    ASK: We discussed the diagnosis of obesity with Molly [name] today and Molly [name] agreed to give [name] permission to discuss obesity behavioral modification therapy today.  ASSESS: Molly [name] has the diagnosis of obesity and her BMI today is 36.68 Molly [name] is in the action stage of change   ADVISE: Molly [name] was educated on the multiple health risks of obesity as well as the benefit of weight loss to improve her health. She was advised of the need for long term treatment and the importance of lifestyle modifications.  AGREE: Multiple dietary modification options and treatment options were discussed and  Molly [name] agreed to the above obesity treatment plan.  I, Trixie Dredge, am acting as transcriptionist for Ilene Qua, MD  I have reviewed the above documentation for accuracy and completeness, and I agree with the above. - Ilene Qua, MD

## 2018-02-24 ENCOUNTER — Ambulatory Visit (INDEPENDENT_AMBULATORY_CARE_PROVIDER_SITE_OTHER): Payer: Medicare HMO | Admitting: Family Medicine

## 2018-02-24 VITALS — BP 120/75 | HR 86 | Temp 99.3°F | Ht 63.0 in | Wt 209.0 lb

## 2018-02-24 DIAGNOSIS — Z6837 Body mass index (BMI) 37.0-37.9, adult: Secondary | ICD-10-CM

## 2018-02-24 DIAGNOSIS — I10 Essential (primary) hypertension: Secondary | ICD-10-CM | POA: Diagnosis not present

## 2018-02-24 DIAGNOSIS — E119 Type 2 diabetes mellitus without complications: Secondary | ICD-10-CM | POA: Diagnosis not present

## 2018-02-28 NOTE — Progress Notes (Signed)
Office: [phone]  /  Fax: [phone]   HPI:   Chief Complaint: OBESITY Molly [name] is here to discuss her progress with her obesity treatment plan. She is on the Category 1 plan and is following her eating plan approximately 70 % of the time. She states she is exercising 5 to 10 minutes 2 to 3 times per week. Molly [name] went out of town last week and didn't follow the plan; and she found it difficult to follow the meal plan. Her weight is 209 lb (94.8 kg) today and has had a weight gain of 2 pounds over a period of 2 weeks since her last visit. She has lost 14 lbs since starting treatment with [name].  Hypertension Molly [name] is a 72 y.o. female with hypertension.  Molly [name] denies chest pain, chest pressure or headache She is working weight loss to help control her blood pressure with the goal of decreasing her risk of heart attack and stroke. Molly [name] blood pressure is currently controlled.  Diabetes II Molly [name] has a diagnosis of diabetes type II. Molly [name] had occasional GI upset while on vacation, on metformin. She denies any hypoglycemic episodes. Last A1c was at 6.0 She has been working on intensive lifestyle modifications including diet, exercise, and weight loss to help control her blood glucose levels.  ALLERGIES: No Known Allergies  MEDICATIONS: Current Outpatient Medications on File Prior to Visit  Medication Sig Dispense Refill  . amLODipine (NORVASC) 10 MG tablet Take 1 tablet (10 mg total) by mouth daily. 30 tablet 0  . Cholecalciferol (VITAMIN D3) 5000 units CAPS Take 1 capsule by mouth daily.    [name] co-enzyme Q-10 30 MG capsule Take 30 mg by mouth 3 (three) times daily.    . diazepam (VALIUM) 5 MG tablet Take 0.5 tablets (2.5 mg total) by mouth every 8 (eight) hours as needed (vertigo/dizziness). 10 tablet 0  . escitalopram (LEXAPRO) 20 MG tablet     . fluticasone (FLONASE) 50 MCG/ACT nasal spray Place 2 sprays into both nostrils daily as needed for allergies or rhinitis. 16 g 5  .  furosemide (LASIX) 20 MG tablet Take 20 mg by mouth as needed.     [name] levocetirizine (XYZAL) 5 MG tablet Take 1 tablet (5 mg total) by mouth every evening. 30 tablet 5  . losartan-hydrochlorothiazide (HYZAAR) 100-25 MG tablet Take 1 tablet by mouth daily. 30 tablet 0  . metFORMIN (GLUCOPHAGE) 500 MG tablet Take 2 in the AM and 1 in the PM 90 tablet 0  . Multiple Vitamins-Minerals (MULTIVITAMIN ADULT PO) Take 1 tablet by mouth daily.    . Omega-3 Fatty Acids (FISH OIL PO) Take 1 tablet by mouth daily.    . ondansetron (ZOFRAN ODT) 4 MG disintegrating tablet Take 1 tablet (4 mg total) by mouth every 8 (eight) hours as needed for nausea or vomiting. 10 tablet 0  . rosuvastatin (CRESTOR) 10 MG tablet Take 1 tablet (10 mg total) by mouth daily. 30 tablet 0   No current facility-administered medications on file prior to visit.     PAST MEDICAL HISTORY: Past Medical History:  Diagnosis Date  . Anxiety   . Constipation   . Depression   . Diabetes type 2, controlled ([place])   . Diarrhea   . Fatigue   . Frequent urination   . GERD (gastroesophageal reflux disease)   . Hyperlipidemia   . Hypertension   . Kidney disease   . Leg cramps   . Multiple food allergies   . Muscle  stiffness   . Seasonal allergies   . Shortness of breath   . Sinus pain   . Swallowing difficulty   . Swelling of extremity   . Wheezing     PAST SURGICAL HISTORY: Past Surgical History:  Procedure Laterality Date  . CHOLECYSTECTOMY  1999  . FOOT SURGERY    . MASTECTOMY      SOCIAL HISTORY: Social History   Tobacco Use  . Smoking status: Former Smoker    Types: Cigarettes    Last attempt to quit: 03/23/1998    Years since quitting: 19.9  . Smokeless tobacco: Never Used  Substance Use Topics  . Alcohol use: Yes    Comment: occasional  . Drug use: No    FAMILY HISTORY: Family History  Problem Relation Age of Onset  . Allergic rhinitis Sister   . Eczema Grandchild   . Eczema Daughter   . Urticaria  Daughter   . Lupus Other   . Hypertension Mother   . Stroke Mother   . Obesity Mother   . Hypertension Father   . Heart disease Father   . Sudden death Father   . Asthma Neg Hx   . Atopy Neg Hx   . Angioedema Neg Hx     ROS: Review of Systems  Constitutional: Negative for weight loss.  Cardiovascular: Negative for chest pain.       Negative for chest pressure  Gastrointestinal: Positive for diarrhea, nausea and vomiting.  Musculoskeletal:       Negative for muscle weakness  Neurological: Negative for headaches.    PHYSICAL EXAM: Blood pressure 120/75, pulse 86, temperature 99.3 F (37.4 C), temperature source Oral, height 5\' 3"  (1.6 m), weight 209 lb (94.8 kg), SpO2 97 %. Body mass index is 37.02 kg/m. Physical Exam  Constitutional: She is oriented to person, place, and time. She appears well-developed and well-nourished.  Cardiovascular: Normal rate.  Pulmonary/Chest: Effort normal.  Musculoskeletal: Normal range of motion.  Neurological: She is oriented to person, place, and time.  Skin: Skin is warm and dry.  Psychiatric: She has a normal mood and affect. Her behavior is normal.  Vitals reviewed.   RECENT LABS AND TESTS: BMET    Component Value Date/Time   NA 143 12/21/2017 0942   K 3.8 12/21/2017 0942   CL 102 12/21/2017 0942   CO2 24 12/21/2017 0942   GLUCOSE 114 (H) 12/21/2017 0942   GLUCOSE 134 (H) 09/12/2017 2118   BUN 10 12/21/2017 0942   CREATININE 0.95 12/21/2017 0942   CREATININE 0.90 03/23/2016 1116   CALCIUM 10.4 (H) 12/21/2017 0942   GFRNONAA 60 12/21/2017 0942   GFRAA 69 12/21/2017 0942   Lab Results  Component Value Date   HGBA1C 6.0 (H) 12/21/2017   HGBA1C 6.5 (H) 08/31/2017   HGBA1C (H) 08/31/2010    6.0 (NOTE)                                                                       According to the ADA Clinical Practice Recommendations for 2011, when HbA1c is used as a screening test:   >=6.5%   Diagnostic of Diabetes Mellitus            (if abnormal result  is confirmed)  5.7-6.4%   Increased risk of developing Diabetes Mellitus  References:Diagnosis and Classification of Diabetes Mellitus,Diabetes ZJIR,6789,38(BOFBP 1):S62-S69 and Standards of Medical Care in         Diabetes - 2011,Diabetes ZWCH,8527,78  (Suppl 1):S11-S61.   Lab Results  Component Value Date   INSULIN 17.7 12/21/2017   INSULIN 20.4 08/31/2017   CBC    Component Value Date/Time   WBC 4.4 09/12/2017 2118   RBC 4.72 09/12/2017 2118   HGB 15.0 09/12/2017 2118   HGB 14.0 08/31/2017 1140   HCT 43.7 09/12/2017 2118   HCT 41.2 08/31/2017 1140   PLT 173 09/12/2017 2118   MCV 92.6 09/12/2017 2118   MCV 92 08/31/2017 1140   MCH 31.8 09/12/2017 2118   MCHC 34.3 09/12/2017 2118   RDW 13.1 09/12/2017 2118   RDW 13.7 08/31/2017 1140   LYMPHSABS 1.7 08/31/2017 1140   MONOABS 324 03/23/2016 1116   EOSABS 0.1 08/31/2017 1140   BASOSABS 0.0 08/31/2017 1140   Iron/TIBC/Ferritin/ %Sat No results found for: IRON, TIBC, FERRITIN, IRONPCTSAT Lipid Panel     Component Value Date/Time   CHOL 150 08/31/2017 1140   TRIG 105 08/31/2017 1140   HDL 66 08/31/2017 1140   CHOLHDL 2.6 08/31/2010 0540   VLDL 12 08/31/2010 0540   LDLCALC 63 08/31/2017 1140   Hepatic Function Panel     Component Value Date/Time   PROT 7.5 12/21/2017 0942   ALBUMIN 4.7 12/21/2017 0942   AST 11 12/21/2017 0942   ALT 9 12/21/2017 0942   ALKPHOS 53 12/21/2017 0942   BILITOT 0.3 12/21/2017 0942      Component Value Date/Time   TSH 1.680 08/31/2017 1140   TSH 1.57 03/23/2016 1116   Results for PEYTIN, DECHERT (MRN [id number]) as of 02/28/2018 08:36  Ref. Range 08/31/2017 11:40  Vitamin D, 25-Hydroxy Latest Ref Range: 30.0 - 100.0 ng/mL 49.8   ASSESSMENT AND PLAN: Essential hypertension  Type 2 diabetes mellitus without complication, without long-term current use of insulin (HCC)  Class 2 severe obesity with serious comorbidity and body mass index (BMI) of 37.0 to 37.9 in adult,  unspecified obesity type ([place])  PLAN:  Hypertension We discussed sodium restriction, working on healthy weight loss, and a regular exercise program as the means to achieve improved blood pressure control. Gwenyth agreed with this plan and agreed to follow up as directed. We will continue to monitor her blood pressure as well as her progress with the above lifestyle modifications. She will continue her medications as prescribed and will watch for signs of hypotension as she continues her lifestyle modifications.  Diabetes II Ivorie has been given extensive diabetes education by myself today including ideal fasting and post-prandial blood glucose readings, individual ideal Hgb A1c goals and hypoglycemia prevention. We discussed the importance of good blood sugar control to decrease the likelihood of diabetic complications such as nephropathy, neuropathy, limb loss, blindness, coronary artery disease, and death. We discussed the importance of intensive lifestyle modification including diet, exercise and weight loss as the first line treatment for diabetes. Almendra agrees to continue metformin  and will follow up at the agreed upon time.  We spent > than 50% of the 15 minute visit on the counseling as documented in the note.  Obesity Zira is currently in the action stage of change. As such, her goal is to continue with weight loss efforts She has agreed to follow the Category 1 plan Yesica has been instructed to work up to a goal of 150 minutes  of combined cardio and strengthening exercise per week for weight loss and overall health benefits. We discussed the following Behavioral Modification Strategies today: planning for success, better snacking choices, increasing lean protein intake, increasing vegetables and work on meal planning and easy cooking plans   Hildred has agreed to follow up with our clinic in 2 weeks. She was informed of the importance of frequent follow up visits to maximize her success with  intensive lifestyle modifications for her multiple health conditions.   OBESITY BEHAVIORAL INTERVENTION VISIT  Today's visit was # 11 out of 22.  Starting weight: 223 lbs Starting date: 08/31/17 Today's weight : 209 lbs  Today's date: 02/24/2018 Total lbs lost to date: 14    ASK: We discussed the diagnosis of obesity with Kerry Hough today and Mikah agreed to give [name] permission to discuss obesity behavioral modification therapy today.  ASSESS: Yaretzi has the diagnosis of obesity and her BMI today is 37.03 Miyani is in the action stage of change   ADVISE: Logan was educated on the multiple health risks of obesity as well as the benefit of weight loss to improve her health. She was advised of the need for long term treatment and the importance of lifestyle modifications.  AGREE: Multiple dietary modification options and treatment options were discussed and  Zendayah agreed to the above obesity treatment plan.  I, Doreene Nest, am acting as transcriptionist for Eber Jones, MD  I have reviewed the above documentation for accuracy and completeness, and I agree with the above. - Ilene Qua, MD

## 2018-03-17 ENCOUNTER — Ambulatory Visit (INDEPENDENT_AMBULATORY_CARE_PROVIDER_SITE_OTHER): Payer: Medicare HMO | Admitting: Family Medicine

## 2018-03-17 ENCOUNTER — Encounter (INDEPENDENT_AMBULATORY_CARE_PROVIDER_SITE_OTHER): Payer: Self-pay | Admitting: Family Medicine

## 2018-03-17 VITALS — BP 138/79 | HR 87 | Temp 97.8°F | Ht 63.0 in | Wt 206.0 lb

## 2018-03-17 DIAGNOSIS — Z6836 Body mass index (BMI) 36.0-36.9, adult: Secondary | ICD-10-CM

## 2018-03-17 DIAGNOSIS — E559 Vitamin D deficiency, unspecified: Secondary | ICD-10-CM | POA: Diagnosis not present

## 2018-03-17 DIAGNOSIS — I10 Essential (primary) hypertension: Secondary | ICD-10-CM | POA: Diagnosis not present

## 2018-03-17 NOTE — Progress Notes (Signed)
Office: [phone]  /  Fax: [phone]   HPI:   Chief Complaint: OBESITY Molly [name] is here to discuss her progress with her obesity treatment plan. She is on the Category 1 plan and is following her eating plan approximately 80 % of the time. She states she is exercising with resistance bands 30 minutes 3 times per week. Molly [name] does most of the food on her meal plan, but she doesn't necessarily do all of the meat at dinner. Her weight is 206 lb (93.4 kg) today and has had a weight loss of 3 pounds over a period of 3 weeks since her last visit. She has lost 17 lbs since starting treatment with [name].  Vitamin D deficiency Molly [name] has a diagnosis of vitamin D deficiency. She is currently taking OTC vit D and denies nausea, vomiting or muscle weakness.  Hypertension Molly [name] is a 72 y.o. female with hypertension and she is currently taking Amlodipine and Hyzaar. Molly [name] denies chest pain, chest pressure or headache. She is working weight loss to help control her blood pressure with the goal of decreasing her risk of heart attack and stroke. Molly [name] blood pressure is currently controlled.  ALLERGIES: No Known Allergies  MEDICATIONS: Current Outpatient Medications on File Prior to Visit  Medication Sig Dispense Refill  . amLODipine (NORVASC) 10 MG tablet Take 1 tablet (10 mg total) by mouth daily. 30 tablet 0  . Cholecalciferol (VITAMIN D3) 5000 units CAPS Take 1 capsule by mouth daily.    [name] co-enzyme Q-10 30 MG capsule Take 30 mg by mouth 3 (three) times daily.    . diazepam (VALIUM) 5 MG tablet Take 0.5 tablets (2.5 mg total) by mouth every 8 (eight) hours as needed (vertigo/dizziness). 10 tablet 0  . escitalopram (LEXAPRO) 20 MG tablet     . fluticasone (FLONASE) 50 MCG/ACT nasal spray Place 2 sprays into both nostrils daily as needed for allergies or rhinitis. 16 g 5  . furosemide (LASIX) 20 MG tablet Take 20 mg by mouth as needed.     [name] levocetirizine (XYZAL) 5 MG tablet Take 1 tablet (5  mg total) by mouth every evening. 30 tablet 5  . losartan-hydrochlorothiazide (HYZAAR) 100-25 MG tablet Take 1 tablet by mouth daily. 30 tablet 0  . metFORMIN (GLUCOPHAGE) 500 MG tablet Take 2 in the AM and 1 in the PM 90 tablet 0  . Multiple Vitamins-Minerals (MULTIVITAMIN ADULT PO) Take 1 tablet by mouth daily.    . Omega-3 Fatty Acids (FISH OIL PO) Take 1 tablet by mouth daily.    . ondansetron (ZOFRAN ODT) 4 MG disintegrating tablet Take 1 tablet (4 mg total) by mouth every 8 (eight) hours as needed for nausea or vomiting. 10 tablet 0  . rosuvastatin (CRESTOR) 10 MG tablet Take 1 tablet (10 mg total) by mouth daily. 30 tablet 0   No current facility-administered medications on file prior to visit.     PAST MEDICAL HISTORY: Past Medical History:  Diagnosis Date  . Anxiety   . Constipation   . Depression   . Diabetes type 2, controlled ([place])   . Diarrhea   . Fatigue   . Frequent urination   . GERD (gastroesophageal reflux disease)   . Hyperlipidemia   . Hypertension   . Kidney disease   . Leg cramps   . Multiple food allergies   . Muscle stiffness   . Seasonal allergies   . Shortness of breath   . Sinus pain   . Swallowing  difficulty   . Swelling of extremity   . Wheezing     PAST SURGICAL HISTORY: Past Surgical History:  Procedure Laterality Date  . CHOLECYSTECTOMY  1999  . FOOT SURGERY    . MASTECTOMY      SOCIAL HISTORY: Social History   Tobacco Use  . Smoking status: Former Smoker    Types: Cigarettes    Last attempt to quit: 03/23/1998    Years since quitting: 19.9  . Smokeless tobacco: Never Used  Substance Use Topics  . Alcohol use: Yes    Comment: occasional  . Drug use: No    FAMILY HISTORY: Family History  Problem Relation Age of Onset  . Allergic rhinitis Sister   . Eczema Grandchild   . Eczema Daughter   . Urticaria Daughter   . Lupus Other   . Hypertension Mother   . Stroke Mother   . Obesity Mother   . Hypertension Father   . Heart  disease Father   . Sudden death Father   . Asthma Neg Hx   . Atopy Neg Hx   . Angioedema Neg Hx     ROS: Review of Systems  Constitutional: Positive for weight loss.  Cardiovascular: Negative for chest pain.       Negative for chest pressure  Gastrointestinal: Negative for nausea and vomiting.  Musculoskeletal:       Negative for muscle weakness  Neurological: Negative for headaches.    PHYSICAL EXAM: Blood pressure 138/79, pulse 87, temperature 97.8 F (36.6 C), temperature source Oral, height 5\' 3"  (1.6 m), weight 206 lb (93.4 kg), SpO2 98 %. Body mass index is 36.49 kg/m. Physical Exam  Constitutional: She is oriented to person, place, and time. She appears well-developed and well-nourished.  Cardiovascular: Normal rate.  Pulmonary/Chest: Effort normal.  Musculoskeletal: Normal range of motion.  Neurological: She is oriented to person, place, and time.  Skin: Skin is warm and dry.  Psychiatric: She has a normal mood and affect. Her behavior is normal.  Vitals reviewed.   RECENT LABS AND TESTS: BMET    Component Value Date/Time   NA 143 12/21/2017 0942   K 3.8 12/21/2017 0942   CL 102 12/21/2017 0942   CO2 24 12/21/2017 0942   GLUCOSE 114 (H) 12/21/2017 0942   GLUCOSE 134 (H) 09/12/2017 2118   BUN 10 12/21/2017 0942   CREATININE 0.95 12/21/2017 0942   CREATININE 0.90 03/23/2016 1116   CALCIUM 10.4 (H) 12/21/2017 0942   GFRNONAA 60 12/21/2017 0942   GFRAA 69 12/21/2017 0942   Lab Results  Component Value Date   HGBA1C 6.0 (H) 12/21/2017   HGBA1C 6.5 (H) 08/31/2017   HGBA1C (H) 08/31/2010    6.0 (NOTE)                                                                       According to the ADA Clinical Practice Recommendations for 2011, when HbA1c is used as a screening test:   >=6.5%   Diagnostic of Diabetes Mellitus           (if abnormal result  is confirmed)  5.7-6.4%   Increased risk of developing Diabetes Mellitus  References:Diagnosis and Classification  of Diabetes Mellitus,Diabetes KKXF,8182,99(BZJIR 1):S62-S69 and Standards of  Medical Care in         Diabetes - 2011,Diabetes Care,2011,34  (Suppl 1):S11-S61.   Lab Results  Component Value Date   INSULIN 17.7 12/21/2017   INSULIN 20.4 08/31/2017   CBC    Component Value Date/Time   WBC 4.4 09/12/2017 2118   RBC 4.72 09/12/2017 2118   HGB 15.0 09/12/2017 2118   HGB 14.0 08/31/2017 1140   HCT 43.7 09/12/2017 2118   HCT 41.2 08/31/2017 1140   PLT 173 09/12/2017 2118   MCV 92.6 09/12/2017 2118   MCV 92 08/31/2017 1140   MCH 31.8 09/12/2017 2118   MCHC 34.3 09/12/2017 2118   RDW 13.1 09/12/2017 2118   RDW 13.7 08/31/2017 1140   LYMPHSABS 1.7 08/31/2017 1140   MONOABS 324 03/23/2016 1116   EOSABS 0.1 08/31/2017 1140   BASOSABS 0.0 08/31/2017 1140   Iron/TIBC/Ferritin/ %Sat No results found for: IRON, TIBC, FERRITIN, IRONPCTSAT Lipid Panel     Component Value Date/Time   CHOL 150 08/31/2017 1140   TRIG 105 08/31/2017 1140   HDL 66 08/31/2017 1140   CHOLHDL 2.6 08/31/2010 0540   VLDL 12 08/31/2010 0540   LDLCALC 63 08/31/2017 1140   Hepatic Function Panel     Component Value Date/Time   PROT 7.5 12/21/2017 0942   ALBUMIN 4.7 12/21/2017 0942   AST 11 12/21/2017 0942   ALT 9 12/21/2017 0942   ALKPHOS 53 12/21/2017 0942   BILITOT 0.3 12/21/2017 0942      Component Value Date/Time   TSH 1.680 08/31/2017 1140   TSH 1.57 03/23/2016 1116   Results for MARITA, BURNSED (MRN [id number]) as of 03/17/2018 16:04  Ref. Range 08/31/2017 11:40  Vitamin D, 25-Hydroxy Latest Ref Range: 30.0 - 100.0 ng/mL 49.8   ASSESSMENT AND PLAN: Vitamin D deficiency  Essential hypertension  Class 2 severe obesity with serious comorbidity and body mass index (BMI) of 36.0 to 36.9 in adult, unspecified obesity type (Molly [name])  PLAN:  Vitamin D Deficiency Molly [name] was informed that low vitamin D levels contributes to fatigue and are associated with obesity, breast, and colon cancer. She agrees to  continue to take OTC Vit D @5 ,000 IU daily and will follow up for routine testing of vitamin D, at least 2-3 times per year. She was informed of the risk of over-replacement of vitamin D and agrees to not increase her dose unless she discusses this with [name] first.  Hypertension We discussed sodium restriction, working on healthy weight loss, and a regular exercise program as the means to achieve improved blood pressure control. Molly [name] agreed with this plan and agreed to follow up as directed. We will continue to monitor her blood pressure as well as her progress with the above lifestyle modifications. She will continue her medications as prescribed and will watch for signs of hypotension as she continues her lifestyle modifications.  We spent > than 50% of the 15 minute visit on the counseling as documented in the note.  Obesity Molly [name] is currently in the action stage of change. As such, her goal is to continue with weight loss efforts She has agreed to follow the Category 1 plan Molly [name] has been instructed to work up to a goal of 150 minutes of combined cardio and strengthening exercise per week for weight loss and overall health benefits. We discussed the following Behavioral Modification Strategies today: better snacking choices, planning for success,  increasing lean protein intake, increasing vegetables and work on meal planning and easy cooking plans  Molly [name]  is going to start planning to weigh her meat and get all of the meat intake.  Molly [name] has agreed to follow up with our clinic in 2 weeks. She was informed of the importance of frequent follow up visits to maximize her success with intensive lifestyle modifications for her multiple health conditions.   OBESITY BEHAVIORAL INTERVENTION VISIT  Today's visit was # 12   Starting weight: 223 lbs Starting date: 08/31/17 Today's weight : 206 lbs  Today's date: 03/17/2018 Total lbs lost to date: 17 At least 15 minutes were spent on discussing the  following behavioral intervention visit.   ASK: We discussed the diagnosis of obesity with Molly [name] today and Molly [name] agreed to give [name] permission to discuss obesity behavioral modification therapy today.  ASSESS: Molly [name] has the diagnosis of obesity and her BMI today is 41.5 Molly [name] is in the action stage of change   ADVISE: Molly [name] was educated on the multiple health risks of obesity as well as the benefit of weight loss to improve her health. She was advised of the need for long term treatment and the importance of lifestyle modifications to improve her current health and to decrease her risk of future health problems.  AGREE: Multiple dietary modification options and treatment options were discussed and  Molly [name] agreed to follow the recommendations documented in the above note.  ARRANGE: Saskia was educated on the importance of frequent visits to treat obesity as outlined per CMS and USPSTF guidelines and agreed to schedule her next follow up appointment today.  I, Molly [name], am acting as Location manager for Eber Jones   I have reviewed the above documentation for accuracy and completeness, and I agree with the above. - Molly Qua, MD

## 2018-04-07 ENCOUNTER — Ambulatory Visit (INDEPENDENT_AMBULATORY_CARE_PROVIDER_SITE_OTHER): Payer: Medicare HMO | Admitting: Bariatrics

## 2018-04-07 ENCOUNTER — Encounter (INDEPENDENT_AMBULATORY_CARE_PROVIDER_SITE_OTHER): Payer: Self-pay | Admitting: Bariatrics

## 2018-04-07 VITALS — BP 130/80 | HR 93 | Temp 98.1°F | Ht 63.0 in | Wt 206.0 lb

## 2018-04-07 DIAGNOSIS — Z6836 Body mass index (BMI) 36.0-36.9, adult: Secondary | ICD-10-CM | POA: Diagnosis not present

## 2018-04-07 DIAGNOSIS — E559 Vitamin D deficiency, unspecified: Secondary | ICD-10-CM

## 2018-04-07 DIAGNOSIS — I10 Essential (primary) hypertension: Secondary | ICD-10-CM | POA: Diagnosis not present

## 2018-04-11 NOTE — Progress Notes (Signed)
Office: [phone]  /  Fax: [phone]   HPI:   Chief Complaint: OBESITY Molly [name] is here to discuss her progress with her obesity treatment plan. She is on the Category 1 plan and is following her eating plan approximately 80 % of the time. She states she is walking and using resistance bands for 20 minutes 2 times per week. Molly [name] is still doing ok on the Category 1 plan. She is having occasional hunger, but it is manageable and cravings are essentially controlled. Her weight is 206 lb (93.4 kg) today and has not lost weight since her last visit. She has lost 17 lbs since starting treatment with [name].  Vitamin D deficiency Molly [name] has a diagnosis of vitamin D deficiency. She is currently taking OTC vit D. Her last vit D level was 49.8 on 08/31/17. She denies nausea, vomiting or muscle weakness.  Hypertension We discussed sodium restriction, working on healthy weight loss, and a regular exercise program as the means to achieve improved blood pressure control. Molly [name] agreed with this plan and agreed to follow up as directed. We will continue to monitor her blood pressure as well as her progress with the above lifestyle modifications. She will continue her medications as prescribed and will watch for signs of hypotension as she continues her lifestyle modifications. She is taking Amlodipine and Hyzaar. She denies chest pain, lightheadedness, or hypotension.  ALLERGIES: No Known Allergies  MEDICATIONS: Current Outpatient Medications on File Prior to Visit  Medication Sig Dispense Refill  . amLODipine (NORVASC) 10 MG tablet Take 1 tablet (10 mg total) by mouth daily. 30 tablet 0  . Cholecalciferol (VITAMIN D3) 5000 units CAPS Take 1 capsule by mouth daily.    [name] co-enzyme Q-10 30 MG capsule Take 30 mg by mouth 3 (three) times daily.    . diazepam (VALIUM) 5 MG tablet Take 0.5 tablets (2.5 mg total) by mouth every 8 (eight) hours as needed (vertigo/dizziness). 10 tablet 0  . escitalopram (LEXAPRO) 20  MG tablet     . fluticasone (FLONASE) 50 MCG/ACT nasal spray Place 2 sprays into both nostrils daily as needed for allergies or rhinitis. 16 g 5  . furosemide (LASIX) 20 MG tablet Take 20 mg by mouth as needed.     [name] levocetirizine (XYZAL) 5 MG tablet Take 1 tablet (5 mg total) by mouth every evening. 30 tablet 5  . losartan-hydrochlorothiazide (HYZAAR) 100-25 MG tablet Take 1 tablet by mouth daily. 30 tablet 0  . metFORMIN (GLUCOPHAGE) 500 MG tablet Take 2 in the AM and 1 in the PM 90 tablet 0  . Multiple Vitamins-Minerals (MULTIVITAMIN ADULT PO) Take 1 tablet by mouth daily.    . Omega-3 Fatty Acids (FISH OIL PO) Take 1 tablet by mouth daily.    . ondansetron (ZOFRAN ODT) 4 MG disintegrating tablet Take 1 tablet (4 mg total) by mouth every 8 (eight) hours as needed for nausea or vomiting. 10 tablet 0  . rosuvastatin (CRESTOR) 10 MG tablet Take 1 tablet (10 mg total) by mouth daily. 30 tablet 0   No current facility-administered medications on file prior to visit.     PAST MEDICAL HISTORY: Past Medical History:  Diagnosis Date  . Anxiety   . Constipation   . Depression   . Diabetes type 2, controlled ([place])   . Diarrhea   . Fatigue   . Frequent urination   . GERD (gastroesophageal reflux disease)   . Hyperlipidemia   . Hypertension   . Kidney disease   .  Leg cramps   . Multiple food allergies   . Muscle stiffness   . Seasonal allergies   . Shortness of breath   . Sinus pain   . Swallowing difficulty   . Swelling of extremity   . Wheezing     PAST SURGICAL HISTORY: Past Surgical History:  Procedure Laterality Date  . CHOLECYSTECTOMY  1999  . FOOT SURGERY    . MASTECTOMY      SOCIAL HISTORY: Social History   Tobacco Use  . Smoking status: Former Smoker    Types: Cigarettes    Last attempt to quit: 03/23/1998    Years since quitting: 20.0  . Smokeless tobacco: Never Used  Substance Use Topics  . Alcohol use: Yes    Comment: occasional  . Drug use: No     FAMILY HISTORY: Family History  Problem Relation Age of Onset  . Allergic rhinitis Sister   . Eczema Grandchild   . Eczema Daughter   . Urticaria Daughter   . Lupus Other   . Hypertension Mother   . Stroke Mother   . Obesity Mother   . Hypertension Father   . Heart disease Father   . Sudden death Father   . Asthma Neg Hx   . Atopy Neg Hx   . Angioedema Neg Hx     ROS: Review of Systems  Constitutional: Negative for weight loss.  Cardiovascular: Negative for chest pain.       Negative for hypotension.  Gastrointestinal: Negative for nausea and vomiting.  Musculoskeletal:       Negative for muscle weakness.  Neurological:       Negative for lightheadedness.    PHYSICAL EXAM: Blood pressure 130/80, pulse 93, temperature 98.1 F (36.7 C), temperature source Oral, height 5\' 3"  (1.6 m), weight 206 lb (93.4 kg), SpO2 98 %. Body mass index is 36.49 kg/m. Physical Exam  Constitutional: She is oriented to person, place, and time. She appears well-developed and well-nourished.  Cardiovascular: Normal rate.  Pulmonary/Chest: Effort normal.  Musculoskeletal: Normal range of motion.  Neurological: She is oriented to person, place, and time.  Skin: Skin is warm and dry.  Psychiatric: She has a normal mood and affect. Her behavior is normal.  Vitals reviewed.   RECENT LABS AND TESTS: BMET    Component Value Date/Time   NA 143 12/21/2017 0942   K 3.8 12/21/2017 0942   CL 102 12/21/2017 0942   CO2 24 12/21/2017 0942   GLUCOSE 114 (H) 12/21/2017 0942   GLUCOSE 134 (H) 09/12/2017 2118   BUN 10 12/21/2017 0942   CREATININE 0.95 12/21/2017 0942   CREATININE 0.90 03/23/2016 1116   CALCIUM 10.4 (H) 12/21/2017 0942   GFRNONAA 60 12/21/2017 0942   GFRAA 69 12/21/2017 0942   Lab Results  Component Value Date   HGBA1C 6.0 (H) 12/21/2017   HGBA1C 6.5 (H) 08/31/2017   HGBA1C (H) 08/31/2010    6.0 (NOTE)                                                                        According to the ADA Clinical Practice Recommendations for 2011, when HbA1c is used as a screening test:   >=6.5%   Diagnostic of Diabetes Mellitus           (  if abnormal result  is confirmed)  5.7-6.4%   Increased risk of developing Diabetes Mellitus  References:Diagnosis and Classification of Diabetes Mellitus,Diabetes IRSW,5462,70(JJKKX 1):S62-S69 and Standards of Medical Care in         Diabetes - 2011,Diabetes FGHW,2993,71  (Suppl 1):S11-S61.   Lab Results  Component Value Date   INSULIN 17.7 12/21/2017   INSULIN 20.4 08/31/2017   CBC    Component Value Date/Time   WBC 4.4 09/12/2017 2118   RBC 4.72 09/12/2017 2118   HGB 15.0 09/12/2017 2118   HGB 14.0 08/31/2017 1140   HCT 43.7 09/12/2017 2118   HCT 41.2 08/31/2017 1140   PLT 173 09/12/2017 2118   MCV 92.6 09/12/2017 2118   MCV 92 08/31/2017 1140   MCH 31.8 09/12/2017 2118   MCHC 34.3 09/12/2017 2118   RDW 13.1 09/12/2017 2118   RDW 13.7 08/31/2017 1140   LYMPHSABS 1.7 08/31/2017 1140   MONOABS 324 03/23/2016 1116   EOSABS 0.1 08/31/2017 1140   BASOSABS 0.0 08/31/2017 1140   Iron/TIBC/Ferritin/ %Sat No results found for: IRON, TIBC, FERRITIN, IRONPCTSAT Lipid Panel     Component Value Date/Time   CHOL 150 08/31/2017 1140   TRIG 105 08/31/2017 1140   HDL 66 08/31/2017 1140   CHOLHDL 2.6 08/31/2010 0540   VLDL 12 08/31/2010 0540   LDLCALC 63 08/31/2017 1140   Hepatic Function Panel     Component Value Date/Time   PROT 7.5 12/21/2017 0942   ALBUMIN 4.7 12/21/2017 0942   AST 11 12/21/2017 0942   ALT 9 12/21/2017 0942   ALKPHOS 53 12/21/2017 0942   BILITOT 0.3 12/21/2017 0942      Component Value Date/Time   TSH 1.680 08/31/2017 1140   TSH 1.57 03/23/2016 1116   Results for QIARA, MINETTI (MRN [id number]) as of 04/11/2018 16:21  Ref. Range 08/31/2017 11:40  Vitamin D, 25-Hydroxy Latest Ref Range: 30.0 - 100.0 ng/mL 49.8   ASSESSMENT AND PLAN: Vitamin D deficiency  Essential hypertension  Class 2 severe  obesity with serious comorbidity and body mass index (BMI) of 36.0 to 36.9 in adult, unspecified obesity type ([place])  PLAN:  Vitamin D Deficiency Molly [name] was informed that low vitamin D levels contributes to fatigue and are associated with obesity, breast, and colon cancer. She agrees to continue to take OTC vitamin D and will follow up for routine testing of vitamin D, at least 2-3 times per year. We will check her vitamin D level at her next visit in 2 weeks.  Hypertension We discussed sodium restriction, working on healthy weight loss, and a regular exercise program as the means to achieve improved blood pressure control. Molly [name] agreed with this plan and agreed to follow up as directed. We will continue to monitor her blood pressure as well as her progress with the above lifestyle modifications. She agrees to continue Amlodipine and Hyzaar and will decrease salt. She will continue exercise and will watch for signs of hypotension as she continues her lifestyle modifications.  I spent > than 50% of the 15 minute visit on counseling as documented in the note.  Obesity Molly [name] is currently in the action stage of change. As such, her goal is to continue with weight loss efforts. She has agreed to follow the Category 1 plan. She was given 100 calorie snack ideas and will incorporate these into her diet. She agrees to increase water and will ensure the amount of protein. Molly [name] has been instructed to continue her current exercise routine and work up  to a goal of 150 minutes of combined cardio and strengthening exercise per week for weight loss and overall health benefits. We discussed the following Behavioral Modification Strategies today: increasing lean protein intake,decreasing simple carbohydrates, increase H2O intake, no skipping meals, and meal planning and cooking strategies.   Molly [name] has agreed to follow up with our clinic in 2 weeks. She was informed of the importance of frequent follow up visits to  maximize hersuccess with intensive lifestyle modifications for her multiple health conditions.   OBESITY BEHAVIORAL INTERVENTION VISIT  Today's visit was # 13   Starting weight: 223 lbs Starting date: 08/31/17 Today's weight : Weight: 206 lb (93.4 kg)  Today's date: 04/07/2018 Total lbs lost to date:17 At least 15 minutes were spent on discussing the following behavioral intervention visit.  ASK: We discussed the diagnosis of obesity with Kerry Hough today and Molly [name] agreed to give [name] permission to discuss obesity behavioral modification therapy today.  ASSESS: Molly [name] has the diagnosis of obesity and her BMI today is 36.5. Molly [name] is in the action stage of change.   ADVISE: Molly [name] was educated on the multiple health risks of obesity as well as the benefit of weight loss to improve her health. She was advised of the need for long term treatment and the importance of lifestyle modifications to improve her current health and to decrease her risk of future health problems.  AGREE: Multiple dietary modification options and treatment options were discussed and [place] agreed to follow the recommendations documented in the above note.  ARRANGE: Molly [name] was educated on the importance of frequent visits to treat obesity as outlined per CMS and USPSTF guidelines and agreed to schedule her next follow up appointment today.  I, Marcille Blanco, am acting as Location manager for [employer]. Owens Shark, DO  I have reviewed the above documentation for accuracy and completeness, and I agree with the above. -Jearld Lesch, DO

## 2018-04-21 ENCOUNTER — Ambulatory Visit (INDEPENDENT_AMBULATORY_CARE_PROVIDER_SITE_OTHER): Payer: Medicare HMO | Admitting: Bariatrics

## 2018-05-05 ENCOUNTER — Ambulatory Visit (INDEPENDENT_AMBULATORY_CARE_PROVIDER_SITE_OTHER): Payer: Medicare HMO | Admitting: Bariatrics

## 2018-05-05 VITALS — BP 135/79 | HR 68 | Temp 98.3°F | Ht 63.0 in | Wt 209.0 lb

## 2018-05-05 DIAGNOSIS — E119 Type 2 diabetes mellitus without complications: Secondary | ICD-10-CM | POA: Diagnosis not present

## 2018-05-05 DIAGNOSIS — E559 Vitamin D deficiency, unspecified: Secondary | ICD-10-CM | POA: Diagnosis not present

## 2018-05-05 DIAGNOSIS — I10 Essential (primary) hypertension: Secondary | ICD-10-CM

## 2018-05-05 DIAGNOSIS — Z6837 Body mass index (BMI) 37.0-37.9, adult: Secondary | ICD-10-CM | POA: Diagnosis not present

## 2018-05-06 LAB — COMPREHENSIVE METABOLIC PANEL
ALT: 11 IU/L (ref 0–32)
AST: 18 IU/L (ref 0–40)
Albumin/Globulin Ratio: 1.8 (ref 1.2–2.2)
Albumin: 4.7 g/dL (ref 3.5–4.8)
Alkaline Phosphatase: 47 IU/L (ref 39–117)
BUN/Creatinine Ratio: 14 (ref 12–28)
BUN: 14 mg/dL (ref 8–27)
Bilirubin Total: 0.3 mg/dL (ref 0.0–1.2)
CALCIUM: 10.1 mg/dL (ref 8.7–10.3)
CO2: 25 mmol/L (ref 20–29)
CREATININE: 0.97 mg/dL (ref 0.57–1.00)
Chloride: 101 mmol/L (ref 96–106)
GFR calc Af Amer: 67 mL/min/{1.73_m2} (ref >59)
GFR, EST NON AFRICAN AMERICAN: 59 mL/min/{1.73_m2} — AB (ref >59)
Globulin, Total: 2.6 g/dL (ref 1.5–4.5)
Glucose: 106 mg/dL — ABNORMAL HIGH (ref 65–99)
Potassium: 3.7 mmol/L (ref 3.5–5.2)
Sodium: 142 mmol/L (ref 134–144)
Total Protein: 7.3 g/dL (ref 6.0–8.5)

## 2018-05-06 LAB — HEMOGLOBIN A1C
ESTIMATED AVERAGE GLUCOSE: 123 mg/dL
Hgb A1c MFr Bld: 5.9 % — ABNORMAL HIGH (ref 4.8–5.6)

## 2018-05-06 LAB — INSULIN, RANDOM: INSULIN: 17.2 u[IU]/mL (ref 2.6–24.9)

## 2018-05-06 LAB — VITAMIN D 25 HYDROXY (VIT D DEFICIENCY, FRACTURES): VIT D 25 HYDROXY: 38 ng/mL (ref 30.0–100.0)

## 2018-05-09 DIAGNOSIS — Z6837 Body mass index (BMI) 37.0-37.9, adult: Secondary | ICD-10-CM

## 2018-05-09 NOTE — Progress Notes (Signed)
Office: [phone]  /  Fax: [phone]   HPI:   Chief Complaint: OBESITY Molly [name] is here to discuss her progress with her obesity treatment plan. She is on the Category 1 plan and is following her eating plan approximately 70 % of the time. She states she is walking and using resistance bands for 20 to 30 minutes 5 times per week. Molly [name] is currently struggling with increased stress due to a death in the family and has been traveling and entertaining.  Her weight is 209 lb (94.8 kg) today and has had a weight gain of 3 pounds in 4 weeks since her last visit. She has lost 14 lbs since starting treatment with [name].  Diabetes II Molly [name] has a diagnosis of diabetes type II. Molly [name] is taking metformin and her A1c was 5.9 on 05/05/18. She has been working on intensive lifestyle modifications including diet, exercise, and weight loss to help control her blood glucose levels.  Hypertension Molly [name] is a 72 y.o. female with hypertension. She is working on weight loss to help control her blood pressure with the goal of decreasing her risk of heart attack and stroke. Molly [name]'s blood pressure is reasonably well controlled. She is taking amlodipine 10mg  and losartan-HCTZ 100-25mg .  Vitamin D deficiency Molly [name] has a diagnosis of vitamin D deficiency. She is currently taking OTC vit D and her levels are well controlled. She denies nausea, vomiting or muscle weakness.  ALLERGIES: No Known Allergies  MEDICATIONS: Current Outpatient Medications on File Prior to Visit  Medication Sig Dispense Refill  . amLODipine (NORVASC) 10 MG tablet Take 1 tablet (10 mg total) by mouth daily. 30 tablet 0  . Cholecalciferol (VITAMIN D3) 5000 units CAPS Take 1 capsule by mouth daily.    [name] co-enzyme Q-10 30 MG capsule Take 30 mg by mouth 3 (three) times daily.    . diazepam (VALIUM) 5 MG tablet Take 0.5 tablets (2.5 mg total) by mouth every 8 (eight) hours as needed (vertigo/dizziness). 10 tablet 0  . escitalopram (LEXAPRO) 20  MG tablet     . fluticasone (FLONASE) 50 MCG/ACT nasal spray Place 2 sprays into both nostrils daily as needed for allergies or rhinitis. 16 g 5  . furosemide (LASIX) 20 MG tablet Take 20 mg by mouth as needed.     [name] levocetirizine (XYZAL) 5 MG tablet Take 1 tablet (5 mg total) by mouth every evening. 30 tablet 5  . losartan-hydrochlorothiazide (HYZAAR) 100-25 MG tablet Take 1 tablet by mouth daily. 30 tablet 0  . metFORMIN (GLUCOPHAGE) 500 MG tablet Take 2 in the AM and 1 in the PM 90 tablet 0  . Multiple Vitamins-Minerals (MULTIVITAMIN ADULT PO) Take 1 tablet by mouth daily.    . Omega-3 Fatty Acids (FISH OIL PO) Take 1 tablet by mouth daily.    . ondansetron (ZOFRAN ODT) 4 MG disintegrating tablet Take 1 tablet (4 mg total) by mouth every 8 (eight) hours as needed for nausea or vomiting. 10 tablet 0  . rosuvastatin (CRESTOR) 10 MG tablet Take 1 tablet (10 mg total) by mouth daily. 30 tablet 0   No current facility-administered medications on file prior to visit.     PAST MEDICAL HISTORY: Past Medical History:  Diagnosis Date  . Anxiety   . Constipation   . Depression   . Diabetes type 2, controlled ([place])   . Diarrhea   . Fatigue   . Frequent urination   . GERD (gastroesophageal reflux disease)   . Hyperlipidemia   .  Hypertension   . Kidney disease   . Leg cramps   . Multiple food allergies   . Muscle stiffness   . Seasonal allergies   . Shortness of breath   . Sinus pain   . Swallowing difficulty   . Swelling of extremity   . Wheezing     PAST SURGICAL HISTORY: Past Surgical History:  Procedure Laterality Date  . CHOLECYSTECTOMY  1999  . FOOT SURGERY    . MASTECTOMY      SOCIAL HISTORY: Social History   Tobacco Use  . Smoking status: Former Smoker    Types: Cigarettes    Last attempt to quit: 03/23/1998    Years since quitting: 20.1  . Smokeless tobacco: Never Used  Substance Use Topics  . Alcohol use: Yes    Comment: occasional  . Drug use: No     FAMILY HISTORY: Family History  Problem Relation Age of Onset  . Allergic rhinitis Sister   . Eczema Grandchild   . Eczema Daughter   . Urticaria Daughter   . Lupus Other   . Hypertension Mother   . Stroke Mother   . Obesity Mother   . Hypertension Father   . Heart disease Father   . Sudden death Father   . Asthma Neg Hx   . Atopy Neg Hx   . Angioedema Neg Hx     ROS: Review of Systems  Constitutional: Negative for weight loss.  Gastrointestinal: Negative for nausea and vomiting.  Musculoskeletal:       Negative for muscle weakness.    PHYSICAL EXAM: Blood pressure 135/79, pulse 68, temperature 98.3 F (36.8 C), temperature source Oral, height 5\' 3"  (1.6 m), weight 209 lb (94.8 kg), SpO2 99 %. Body mass index is 37.02 kg/m. Physical Exam  Constitutional: She is oriented to person, place, and time. She appears well-developed and well-nourished.  Cardiovascular: Normal rate.  Pulmonary/Chest: Effort normal.  Musculoskeletal: Normal range of motion.  Neurological: She is oriented to person, place, and time.  Skin: Skin is warm and dry.  Psychiatric: She has a normal mood and affect. Her behavior is normal.  Vitals reviewed.   RECENT LABS AND TESTS: BMET    Component Value Date/Time   NA 142 05/05/2018 0822   K 3.7 05/05/2018 0822   CL 101 05/05/2018 0822   CO2 25 05/05/2018 0822   GLUCOSE 106 (H) 05/05/2018 0822   GLUCOSE 134 (H) 09/12/2017 2118   BUN 14 05/05/2018 0822   CREATININE 0.97 05/05/2018 0822   CREATININE 0.90 03/23/2016 1116   CALCIUM 10.1 05/05/2018 0822   GFRNONAA 59 (L) 05/05/2018 0822   GFRAA 67 05/05/2018 0822   Lab Results  Component Value Date   HGBA1C 5.9 (H) 05/05/2018   HGBA1C 6.0 (H) 12/21/2017   HGBA1C 6.5 (H) 08/31/2017   HGBA1C (H) 08/31/2010    6.0 (NOTE)                                                                       According to the ADA Clinical Practice Recommendations for 2011, when HbA1c is used as a screening  test:   >=6.5%   Diagnostic of Diabetes Mellitus           (if  abnormal result  is confirmed)  5.7-6.4%   Increased risk of developing Diabetes Mellitus  References:Diagnosis and Classification of Diabetes Mellitus,Diabetes DEYC,1448,18(HUDJS 1):S62-S69 and Standards of Medical Care in         Diabetes - 2011,Diabetes HFWY,6378,58  (Suppl 1):S11-S61.   Lab Results  Component Value Date   INSULIN 17.2 05/05/2018   INSULIN 17.7 12/21/2017   INSULIN 20.4 08/31/2017   CBC    Component Value Date/Time   WBC 4.4 09/12/2017 2118   RBC 4.72 09/12/2017 2118   HGB 15.0 09/12/2017 2118   HGB 14.0 08/31/2017 1140   HCT 43.7 09/12/2017 2118   HCT 41.2 08/31/2017 1140   PLT 173 09/12/2017 2118   MCV 92.6 09/12/2017 2118   MCV 92 08/31/2017 1140   MCH 31.8 09/12/2017 2118   MCHC 34.3 09/12/2017 2118   RDW 13.1 09/12/2017 2118   RDW 13.7 08/31/2017 1140   LYMPHSABS 1.7 08/31/2017 1140   MONOABS 324 03/23/2016 1116   EOSABS 0.1 08/31/2017 1140   BASOSABS 0.0 08/31/2017 1140   Iron/TIBC/Ferritin/ %Sat No results found for: IRON, TIBC, FERRITIN, IRONPCTSAT Lipid Panel     Component Value Date/Time   CHOL 150 08/31/2017 1140   TRIG 105 08/31/2017 1140   HDL 66 08/31/2017 1140   CHOLHDL 2.6 08/31/2010 0540   VLDL 12 08/31/2010 0540   LDLCALC 63 08/31/2017 1140   Hepatic Function Panel     Component Value Date/Time   PROT 7.3 05/05/2018 0822   ALBUMIN 4.7 05/05/2018 0822   AST 18 05/05/2018 0822   ALT 11 05/05/2018 0822   ALKPHOS 47 05/05/2018 0822   BILITOT 0.3 05/05/2018 0822      Component Value Date/Time   TSH 1.680 08/31/2017 1140   TSH 1.57 03/23/2016 1116   Results for ANESHIA, JACQUET (MRN [id number]) as of 05/09/2018 11:25  Ref. Range 05/05/2018 08:22  Vitamin D, 25-Hydroxy Latest Ref Range: 30.0 - 100.0 ng/mL 38.0   ASSESSMENT AND PLAN: Type 2 diabetes mellitus without complication, without long-term current use of insulin (HCC) - Plan: Comprehensive metabolic panel,  Hemoglobin A1c, Insulin, random  Essential hypertension  Vitamin D deficiency - Plan: VITAMIN D 25 Hydroxy (Vit-D Deficiency, Fractures)  Class 2 severe obesity with serious comorbidity and body mass index (BMI) of 37.0 to 37.9 in adult, unspecified obesity type ([place])  PLAN:  Diabetes II Molly [name] has been given extensive diabetes education by myself today including ideal fasting and post-prandial blood glucose readings, individual ideal Hgb A1c goals, and hypoglycemia prevention. We discussed the importance of good blood sugar control to decrease the likelihood of diabetic complications such as nephropathy, neuropathy, limb loss, blindness, coronary artery disease, and death. We discussed the importance of intensive lifestyle modification including diet, exercise and weight loss as the first line treatment for diabetes. We checked a fasting Insulin and Hgb A1c today. Molly [name] agrees to continue her diabetes medications and will follow up at the agreed upon time.  Hypertension We discussed sodium restriction, working on healthy weight loss, and a regular exercise program as the means to achieve improved blood pressure control. Molly [name] agreed with this plan and agreed to follow up as directed. We will continue to monitor her blood pressure as well as her progress with the above lifestyle modifications. She will continue her medications as prescribed and will watch for signs of hypotension as she continues her lifestyle modifications. She agrees to follow up in 2 weeks.  Vitamin D Deficiency Molly [name] was informed that low vitamin D levels  contributes to fatigue and are associated with obesity, breast, and colon cancer. She agrees to continue to take OTC Vit D and will follow up for routine testing of vitamin D, at least 2-3 times per year. She was informed of the risk of over-replacement of vitamin D and agrees to not increase her dose unless she discusses this with [name] first. We checked a vitamin D level today. Molly [name]  agrees to follow up in 2 weeks.  Obesity Molly [name] is currently in the action stage of change. As such, her goal is to continue with weight loss efforts. She has agreed to follow the Molly [name] plan with meal planning and increase water intake. Molly [name] has been instructed to work up to a goal of 150 minutes of combined cardio and strengthening exercise per week for weight loss and overall health benefits. We discussed the following Behavioral Modification Strategies today: increasing lean protein intake, decreasing simple carbohydrates, increasing vegetables, and increase H2O intake.  Molly [name] has agreed to follow up with our clinic in 2 weeks. She was informed of the importance of frequent follow up visits to maximize her success with intensive lifestyle modifications for her multiple health conditions.   OBESITY BEHAVIORAL INTERVENTION VISIT  Today's visit was # 14   Starting weight: 223 lbs Starting date: 08/31/17 Today's weight : Weight: 209 lb (94.8 kg)  Today's date: 05/05/2018 Total lbs lost to date: 14 At least 15 minutes were spent on discussing the following behavioral intervention visit.  ASK: We discussed the diagnosis of obesity with Molly [name] today and Molly [name] agreed to give [name] permission to discuss obesity behavioral modification therapy today.  ASSESS: Molly [name] has the diagnosis of obesity and her BMI today is 37.03. Molly [name] is in the action stage of change.   ADVISE: Molly [name] was educated on the multiple health risks of obesity as well as the benefit of weight loss to improve her health. She was advised of the need for long term treatment and the importance of lifestyle modifications to improve her current health and to decrease her risk of future health problems.  AGREE: Multiple dietary modification options and treatment options were discussed and Molly [name] agreed to follow the recommendations documented in the above note.  ARRANGE: Molly [name] was educated on the importance of frequent visits to  treat obesity as outlined per CMS and USPSTF guidelines and agreed to schedule her next follow up appointment today.  I, Marcille Blanco, am acting as Location manager for [employer]. Owens Shark, DO  I have reviewed the above documentation for accuracy and completeness, and I agree with the above. -Jearld Lesch, DO

## 2018-05-19 ENCOUNTER — Ambulatory Visit (INDEPENDENT_AMBULATORY_CARE_PROVIDER_SITE_OTHER): Payer: Medicare HMO | Admitting: Bariatrics

## 2018-05-19 VITALS — BP 108/75 | HR 85 | Temp 98.6°F | Ht 63.0 in | Wt 206.0 lb

## 2018-05-19 DIAGNOSIS — E119 Type 2 diabetes mellitus without complications: Secondary | ICD-10-CM | POA: Diagnosis not present

## 2018-05-19 DIAGNOSIS — I10 Essential (primary) hypertension: Secondary | ICD-10-CM | POA: Diagnosis not present

## 2018-05-19 DIAGNOSIS — Z6836 Body mass index (BMI) 36.0-36.9, adult: Secondary | ICD-10-CM

## 2018-05-24 NOTE — Progress Notes (Signed)
Office: [phone]  /  Fax: [phone]   HPI:   Chief Complaint: OBESITY Molly [name] is here to discuss her progress with her obesity treatment plan. She is on the [employer]cat[employer] meal plan and is following her eating plan approximately 90 % of the time. She states she is exercising with resistance bands 30 minutes 3 times per week. Molly [name] "has got back on track".  Her weight is 206 lb (93.4 kg) today and has had a weight loss of 3 pounds over a period of 2 weeks since her last visit. She has lost 17 lbs since starting treatment with [name].  Hypertension Molly [name] is a 72 y.o. female with hypertension. She is working on weight loss to help control her blood pressure with the goal of decreasing her risk of heart attack and stroke. She is taking Norvasc and Hyzaar. Molly [name] blood pressure is currently well controlled and she denies lightheadedness or headaches.  Diabetes II Molly [name] has a diagnosis of diabetes type II. Molly [name] states that her fasting blood sugars are in the 90's. Her last A1c was 5.9 and Insulin was 17.2 on 05/05/18. She is taking metformin and is not having any side effects. She has been working on intensive lifestyle modifications including diet, exercise, and weight loss to help control her blood glucose levels.  ALLERGIES: No Known Allergies  MEDICATIONS: Current Outpatient Medications on File Prior to Visit  Medication Sig Dispense Refill  . amLODipine (NORVASC) 10 MG tablet Take 1 tablet (10 mg total) by mouth daily. 30 tablet 0  . Cholecalciferol (VITAMIN D3) 5000 units CAPS Take 1 capsule by mouth daily.    [name] co-enzyme Q-10 30 MG capsule Take 30 mg by mouth 3 (three) times daily.    . diazepam (VALIUM) 5 MG tablet Take 0.5 tablets (2.5 mg total) by mouth every 8 (eight) hours as needed (vertigo/dizziness). 10 tablet 0  . escitalopram (LEXAPRO) 20 MG tablet     . fluticasone (FLONASE) 50 MCG/ACT nasal spray Place 2 sprays into both nostrils daily as needed for allergies or rhinitis.  16 g 5  . furosemide (LASIX) 20 MG tablet Take 20 mg by mouth as needed.     [name] levocetirizine (XYZAL) 5 MG tablet Take 1 tablet (5 mg total) by mouth every evening. 30 tablet 5  . losartan-hydrochlorothiazide (HYZAAR) 100-25 MG tablet Take 1 tablet by mouth daily. 30 tablet 0  . metFORMIN (GLUCOPHAGE) 500 MG tablet Take 2 in the AM and 1 in the PM 90 tablet 0  . Multiple Vitamins-Minerals (MULTIVITAMIN ADULT PO) Take 1 tablet by mouth daily.    . Omega-3 Fatty Acids (FISH OIL PO) Take 1 tablet by mouth daily.    . ondansetron (ZOFRAN ODT) 4 MG disintegrating tablet Take 1 tablet (4 mg total) by mouth every 8 (eight) hours as needed for nausea or vomiting. 10 tablet 0  . rosuvastatin (CRESTOR) 10 MG tablet Take 1 tablet (10 mg total) by mouth daily. 30 tablet 0   No current facility-administered medications on file prior to visit.     PAST MEDICAL HISTORY: Past Medical History:  Diagnosis Date  . Anxiety   . Constipation   . Depression   . Diabetes type 2, controlled ([place])   . Diarrhea   . Fatigue   . Frequent urination   . GERD (gastroesophageal reflux disease)   . Hyperlipidemia   . Hypertension   . Kidney disease   . Leg cramps   . Multiple food allergies   .  Muscle stiffness   . Seasonal allergies   . Shortness of breath   . Sinus pain   . Swallowing difficulty   . Swelling of extremity   . Wheezing     PAST SURGICAL HISTORY: Past Surgical History:  Procedure Laterality Date  . CHOLECYSTECTOMY  1999  . FOOT SURGERY    . MASTECTOMY      SOCIAL HISTORY: Social History   Tobacco Use  . Smoking status: Former Smoker    Types: Cigarettes    Last attempt to quit: 03/23/1998    Years since quitting: 20.1  . Smokeless tobacco: Never Used  Substance Use Topics  . Alcohol use: Yes    Comment: occasional  . Drug use: No    FAMILY HISTORY: Family History  Problem Relation Age of Onset  . Allergic rhinitis Sister   . Eczema Grandchild   . Eczema Daughter   .  Urticaria Daughter   . Lupus Other   . Hypertension Mother   . Stroke Mother   . Obesity Mother   . Hypertension Father   . Heart disease Father   . Sudden death Father   . Asthma Neg Hx   . Atopy Neg Hx   . Angioedema Neg Hx     ROS: Review of Systems  Constitutional: Positive for weight loss.  Neurological: Negative for headaches.       Negative for lightheadedness.    PHYSICAL EXAM: Blood pressure 108/75, pulse 85, temperature 98.6 F (37 C), temperature source Oral, height 5\' 3"  (1.6 m), weight 206 lb (93.4 kg), SpO2 99 %. Body mass index is 36.49 kg/m. Physical Exam  Constitutional: She is oriented to person, place, and time. She appears well-developed and well-nourished.  Cardiovascular: Normal rate.  Pulmonary/Chest: Effort normal.  Musculoskeletal: Normal range of motion.  Neurological: She is oriented to person, place, and time.  Skin: Skin is warm and dry.  Psychiatric: She has a normal mood and affect. Her behavior is normal.  Vitals reviewed.   RECENT LABS AND TESTS: BMET    Component Value Date/Time   NA 142 05/05/2018 0822   K 3.7 05/05/2018 0822   CL 101 05/05/2018 0822   CO2 25 05/05/2018 0822   GLUCOSE 106 (H) 05/05/2018 0822   GLUCOSE 134 (H) 09/12/2017 2118   BUN 14 05/05/2018 0822   CREATININE 0.97 05/05/2018 0822   CREATININE 0.90 03/23/2016 1116   CALCIUM 10.1 05/05/2018 0822   GFRNONAA 59 (L) 05/05/2018 0822   GFRAA 67 05/05/2018 0822   Lab Results  Component Value Date   HGBA1C 5.9 (H) 05/05/2018   HGBA1C 6.0 (H) 12/21/2017   HGBA1C 6.5 (H) 08/31/2017   HGBA1C (H) 08/31/2010    6.0 (NOTE)                                                                       According to the ADA Clinical Practice Recommendations for 2011, when HbA1c is used as a screening test:   >=6.5%   Diagnostic of Diabetes Mellitus           (if abnormal result  is confirmed)  5.7-6.4%   Increased risk of developing Diabetes Mellitus  References:Diagnosis and  Classification of Diabetes Mellitus,Diabetes YHCW,2376,28(BTDVV 1):S62-S69 and  Standards of Medical Care in         Diabetes - 2011,Diabetes SVXB,9390,30  (Suppl 1):S11-S61.   Lab Results  Component Value Date   INSULIN 17.2 05/05/2018   INSULIN 17.7 12/21/2017   INSULIN 20.4 08/31/2017   CBC    Component Value Date/Time   WBC 4.4 09/12/2017 2118   RBC 4.72 09/12/2017 2118   HGB 15.0 09/12/2017 2118   HGB 14.0 08/31/2017 1140   HCT 43.7 09/12/2017 2118   HCT 41.2 08/31/2017 1140   PLT 173 09/12/2017 2118   MCV 92.6 09/12/2017 2118   MCV 92 08/31/2017 1140   MCH 31.8 09/12/2017 2118   MCHC 34.3 09/12/2017 2118   RDW 13.1 09/12/2017 2118   RDW 13.7 08/31/2017 1140   LYMPHSABS 1.7 08/31/2017 1140   MONOABS 324 03/23/2016 1116   EOSABS 0.1 08/31/2017 1140   BASOSABS 0.0 08/31/2017 1140   Iron/TIBC/Ferritin/ %Sat No results found for: IRON, TIBC, FERRITIN, IRONPCTSAT Lipid Panel     Component Value Date/Time   CHOL 150 08/31/2017 1140   TRIG 105 08/31/2017 1140   HDL 66 08/31/2017 1140   CHOLHDL 2.6 08/31/2010 0540   VLDL 12 08/31/2010 0540   LDLCALC 63 08/31/2017 1140   Hepatic Function Panel     Component Value Date/Time   PROT 7.3 05/05/2018 0822   ALBUMIN 4.7 05/05/2018 0822   AST 18 05/05/2018 0822   ALT 11 05/05/2018 0822   ALKPHOS 47 05/05/2018 0822   BILITOT 0.3 05/05/2018 0822      Component Value Date/Time   TSH 1.680 08/31/2017 1140   TSH 1.57 03/23/2016 1116   Results for GINIA, RUDELL (MRN [id number]) as of 05/24/2018 08:53  Ref. Range 05/05/2018 08:22  Vitamin D, 25-Hydroxy Latest Ref Range: 30.0 - 100.0 ng/mL 38.0   ASSESSMENT AND PLAN: Essential hypertension  Type 2 diabetes mellitus without complication, without long-term current use of insulin (HCC)  Class 2 severe obesity with serious comorbidity and body mass index (BMI) of 36.0 to 36.9 in adult, unspecified obesity type ([place])  PLAN:  Hypertension We discussed sodium restriction,  working on healthy weight loss, and a regular exercise program as the means to achieve improved blood pressure control. We will continue to monitor her blood pressure as well as her progress with the above lifestyle modifications. She will continue her antihypertensive medications as prescribed and will watch for signs of hypotension as she continues her lifestyle modifications. Molly [name] agreed with this plan and agreed to follow up as directed in 2 weeks.  Diabetes II Molly [name] has been given extensive diabetes education by myself today including ideal fasting and post-prandial blood glucose readings, individual ideal Hgb A1c goals, and hypoglycemia prevention. We discussed the importance of good blood sugar control to decrease the likelihood of diabetic complications such as nephropathy, neuropathy, limb loss, blindness, coronary artery disease, and death. We discussed the importance of intensive lifestyle modification including diet, exercise and weight loss as the first line treatment for diabetes. We will check a Hgb A1c and Insulin at her next visit. Molly [name] agrees to continue her metformin and will follow up at the agreed upon time in 2 weeks.   Obesity Molly [name] is currently in the action stage of change. As such, her goal is to continue with weight loss efforts. She has agreed to the [employer] plan and to continue meal planning. Molly [name] has been instructed to continue resistance exercises and we discussed Youtube. We discussed the following Behavioral Modification Strategies today: increasing lean  protein intake, decreasing simple carbohydrates, increasing vegetables, increase H2O intake, decrease eating out, no skipping meals, and work on meal planning and easy cooking plans.  Molly [name] has agreed to follow up with our clinic in 2 weeks for a fasting appointment with Dr. Adair Patter. She was informed of the importance of frequent follow up visits to maximize her success with intensive lifestyle modifications for her  multiple health conditions.   OBESITY BEHAVIORAL INTERVENTION VISIT  Today's visit was # 15   Starting weight: 223 lbs Starting date: 08/31/17 Today's weight : Weight: 206 lb (93.4 kg)  Today's date: 05/19/2018 Total lbs lost to date: 17 At least 15 minutes were spent on discussing the following behavioral intervention visit.  ASK: We discussed the diagnosis of obesity with Molly [name] today and Molly [name] agreed to give [name] permission to discuss obesity behavioral modification therapy today.  ASSESS: Molly [name] has the diagnosis of obesity and her BMI today is 36.5. Molly [name] is in the action stage of change.   ADVISE: Molly [name] was educated on the multiple health risks of obesity as well as the benefit of weight loss to improve her health. She was advised of the need for long term treatment and the importance of lifestyle modifications to improve her current health and to decrease her risk of future health problems.  AGREE: Multiple dietary modification options and treatment options were discussed and Molly [name] agreed to follow the recommendations documented in the above note.  ARRANGE: Molly [name] was educated on the importance of frequent visits to treat obesity as outlined per CMS and USPSTF guidelines and agreed to schedule her next follow up appointment today.  I, Marcille Blanco, am acting as Location manager for [employer]. Owens Shark, DO  I have reviewed the above documentation for accuracy and completeness, and I agree with the above. -Jearld Lesch, DO

## 2018-06-09 ENCOUNTER — Ambulatory Visit (INDEPENDENT_AMBULATORY_CARE_PROVIDER_SITE_OTHER): Payer: Medicare HMO | Admitting: Family Medicine

## 2018-06-09 VITALS — BP 157/88 | HR 95 | Temp 98.0°F | Ht 63.0 in | Wt 209.0 lb

## 2018-06-09 DIAGNOSIS — E559 Vitamin D deficiency, unspecified: Secondary | ICD-10-CM | POA: Diagnosis not present

## 2018-06-09 DIAGNOSIS — E119 Type 2 diabetes mellitus without complications: Secondary | ICD-10-CM

## 2018-06-09 DIAGNOSIS — Z6837 Body mass index (BMI) 37.0-37.9, adult: Secondary | ICD-10-CM

## 2018-06-14 NOTE — Progress Notes (Signed)
Office: [phone]  /  Fax: [phone]   HPI:   Chief Complaint: OBESITY Molly [name] is here to discuss her progress with her obesity treatment plan. She is on the [employer] and is following her eating plan approximately 80 % of the time. She states she is walking 10 to 20 minutes 3 times per week. Molly [name] ate out 3 times at dinner and felt that she struggled then with getting on track. She will be going to the beach for 1 week at Thanksgiving,  Her weight is 209 lb (94.8 kg) today and has had a weight gain of 3 pounds over a period of 3 weeks since her last visit. She has lost 14 lbs since starting treatment with [name].  Diabetes II Controlled, without Insulin Molly [name] has a diagnosis of diabetes type II. Molly [name] is taking metformin 500mg .  Last A1c was 5.9 and her Insulin was 17.2 on 05/05/18. She has been working on intensive lifestyle modifications including diet, exercise, and weight loss to help control her blood glucose levels.  Vitamin D deficiency Molly [name] has a diagnosis of vitamin D deficiency. She is not on prescription strength vitamin D, but is currently taking vit D 5,000 units. She admits fatigue and denies nausea, vomiting, or muscle weakness.  ALLERGIES: No Known Allergies  MEDICATIONS: Current Outpatient Medications on File Prior to Visit  Medication Sig Dispense Refill  . amLODipine (NORVASC) 10 MG tablet Take 1 tablet (10 mg total) by mouth daily. 30 tablet 0  . Cholecalciferol (VITAMIN D3) 5000 units CAPS Take 1 capsule by mouth daily.    [name] co-enzyme Q-10 30 MG capsule Take 30 mg by mouth 3 (three) times daily.    . diazepam (VALIUM) 5 MG tablet Take 0.5 tablets (2.5 mg total) by mouth every 8 (eight) hours as needed (vertigo/dizziness). 10 tablet 0  . escitalopram (LEXAPRO) 20 MG tablet     . fluticasone (FLONASE) 50 MCG/ACT nasal spray Place 2 sprays into both nostrils daily as needed for allergies or rhinitis. 16 g 5  . furosemide (LASIX) 20 MG tablet Take 20 mg by mouth  as needed.     [name] levocetirizine (XYZAL) 5 MG tablet Take 1 tablet (5 mg total) by mouth every evening. 30 tablet 5  . losartan-hydrochlorothiazide (HYZAAR) 100-25 MG tablet Take 1 tablet by mouth daily. 30 tablet 0  . metFORMIN (GLUCOPHAGE) 500 MG tablet Take 2 in the AM and 1 in the PM 90 tablet 0  . Multiple Vitamins-Minerals (MULTIVITAMIN ADULT PO) Take 1 tablet by mouth daily.    . Omega-3 Fatty Acids (FISH OIL PO) Take 1 tablet by mouth daily.    . ondansetron (ZOFRAN ODT) 4 MG disintegrating tablet Take 1 tablet (4 mg total) by mouth every 8 (eight) hours as needed for nausea or vomiting. 10 tablet 0  . rosuvastatin (CRESTOR) 10 MG tablet Take 1 tablet (10 mg total) by mouth daily. 30 tablet 0   No current facility-administered medications on file prior to visit.     PAST MEDICAL HISTORY: Past Medical History:  Diagnosis Date  . Anxiety   . Constipation   . Depression   . Diabetes type 2, controlled ([place])   . Diarrhea   . Fatigue   . Frequent urination   . GERD (gastroesophageal reflux disease)   . Hyperlipidemia   . Hypertension   . Kidney disease   . Leg cramps   . Multiple food allergies   . Muscle stiffness   . Seasonal allergies   .  Shortness of breath   . Sinus pain   . Swallowing difficulty   . Swelling of extremity   . Wheezing     PAST SURGICAL HISTORY: Past Surgical History:  Procedure Laterality Date  . CHOLECYSTECTOMY  1999  . FOOT SURGERY    . MASTECTOMY      SOCIAL HISTORY: Social History   Tobacco Use  . Smoking status: Former Smoker    Types: Cigarettes    Last attempt to quit: 03/23/1998    Years since quitting: 20.2  . Smokeless tobacco: Never Used  Substance Use Topics  . Alcohol use: Yes    Comment: occasional  . Drug use: No    FAMILY HISTORY: Family History  Problem Relation Age of Onset  . Allergic rhinitis Sister   . Eczema Grandchild   . Eczema Daughter   . Urticaria Daughter   . Lupus Other   . Hypertension Mother   .  Stroke Mother   . Obesity Mother   . Hypertension Father   . Heart disease Father   . Sudden death Father   . Asthma Neg Hx   . Atopy Neg Hx   . Angioedema Neg Hx     ROS: Review of Systems  Constitutional: Positive for malaise/fatigue. Negative for weight loss.  Gastrointestinal: Negative for nausea and vomiting.  Musculoskeletal:       Negative for muscle weakness.    PHYSICAL EXAM: Blood pressure (!) 157/88, pulse 95, temperature 98 F (36.7 C), temperature source Oral, height 5\' 3"  (1.6 m), weight 209 lb (94.8 kg), SpO2 98 %. Body mass index is 37.02 kg/m. Physical Exam  Constitutional: She is oriented to person, place, and time. She appears well-developed and well-nourished.  Cardiovascular: Normal rate.  Pulmonary/Chest: Effort normal.  Musculoskeletal: Normal range of motion.  Neurological: She is oriented to person, place, and time.  Skin: Skin is warm and dry.  Psychiatric: She has a normal mood and affect. Her behavior is normal.  Vitals reviewed.   RECENT LABS AND TESTS: BMET    Component Value Date/Time   NA 142 05/05/2018 0822   K 3.7 05/05/2018 0822   CL 101 05/05/2018 0822   CO2 25 05/05/2018 0822   GLUCOSE 106 (H) 05/05/2018 0822   GLUCOSE 134 (H) 09/12/2017 2118   BUN 14 05/05/2018 0822   CREATININE 0.97 05/05/2018 0822   CREATININE 0.90 03/23/2016 1116   CALCIUM 10.1 05/05/2018 0822   GFRNONAA 59 (L) 05/05/2018 0822   GFRAA 67 05/05/2018 0822   Lab Results  Component Value Date   HGBA1C 5.9 (H) 05/05/2018   HGBA1C 6.0 (H) 12/21/2017   HGBA1C 6.5 (H) 08/31/2017   HGBA1C (H) 08/31/2010    6.0 (NOTE)                                                                       According to the ADA Clinical Practice Recommendations for 2011, when HbA1c is used as a screening test:   >=6.5%   Diagnostic of Diabetes Mellitus           (if abnormal result  is confirmed)  5.7-6.4%   Increased risk of developing Diabetes Mellitus  References:Diagnosis and  Classification of Diabetes Mellitus,Diabetes IOXB,3532,99(MEQAS 1):S62-S69 and Standards  of Medical Care in         Diabetes - 2011,Diabetes Care,2011,34  (Suppl 1):S11-S61.   Lab Results  Component Value Date   INSULIN 17.2 05/05/2018   INSULIN 17.7 12/21/2017   INSULIN 20.4 08/31/2017   CBC    Component Value Date/Time   WBC 4.4 09/12/2017 2118   RBC 4.72 09/12/2017 2118   HGB 15.0 09/12/2017 2118   HGB 14.0 08/31/2017 1140   HCT 43.7 09/12/2017 2118   HCT 41.2 08/31/2017 1140   PLT 173 09/12/2017 2118   MCV 92.6 09/12/2017 2118   MCV 92 08/31/2017 1140   MCH 31.8 09/12/2017 2118   MCHC 34.3 09/12/2017 2118   RDW 13.1 09/12/2017 2118   RDW 13.7 08/31/2017 1140   LYMPHSABS 1.7 08/31/2017 1140   MONOABS 324 03/23/2016 1116   EOSABS 0.1 08/31/2017 1140   BASOSABS 0.0 08/31/2017 1140   Iron/TIBC/Ferritin/ %Sat No results found for: IRON, TIBC, FERRITIN, IRONPCTSAT Lipid Panel     Component Value Date/Time   CHOL 150 08/31/2017 1140   TRIG 105 08/31/2017 1140   HDL 66 08/31/2017 1140   CHOLHDL 2.6 08/31/2010 0540   VLDL 12 08/31/2010 0540   LDLCALC 63 08/31/2017 1140   Hepatic Function Panel     Component Value Date/Time   PROT 7.3 05/05/2018 0822   ALBUMIN 4.7 05/05/2018 0822   AST 18 05/05/2018 0822   ALT 11 05/05/2018 0822   ALKPHOS 47 05/05/2018 0822   BILITOT 0.3 05/05/2018 0822      Component Value Date/Time   TSH 1.680 08/31/2017 1140   TSH 1.57 03/23/2016 1116   Results for MAKYNLEIGH, BRESLIN (MRN [id number]) as of 06/14/2018 13:16  Ref. Range 05/05/2018 08:22  Vitamin D, 25-Hydroxy Latest Ref Range: 30.0 - 100.0 ng/mL 38.0   ASSESSMENT AND PLAN: Type 2 diabetes mellitus without complication, without long-term current use of insulin (HCC)  Vitamin D deficiency  Class 2 severe obesity with serious comorbidity and body mass index (BMI) of 37.0 to 37.9 in adult, unspecified obesity type ([place])  PLAN:  Diabetes II Controlled, without Insulin Molly [name] has  been given extensive diabetes education by myself today including ideal fasting and post-prandial blood glucose readings, individual ideal Hgb A1c goals, and hypoglycemia prevention. We discussed the importance of good blood sugar control to decrease the likelihood of diabetic complications such as nephropathy, neuropathy, limb loss, blindness, coronary artery disease, and death. We discussed the importance of intensive lifestyle modification including diet, exercise and weight loss as the first line treatment for diabetes. Azari agrees to continue her diabetes, blood pressure, and statin medications and will follow up at the agreed upon time in 2 weeks.  Vitamin D Deficiency Molly [name] was informed that low vitamin D levels contributes to fatigue and are associated with obesity, breast, and colon cancer. She agrees to continue to take OTC Vit D 5,000 units every day and will follow up for routine testing of vitamin D, at least 2-3 times per year. She was informed of the risk of over-replacement of vitamin D and agrees to not increase her dose unless she discusses this with [name] first. Molly [name] agrees to follow up as directed.  I spent > than 50% of the 15 minute visit on counseling as documented in the note.  Obesity Molly [name] is currently in the action stage of change. As such, her goal is to continue with weight loss efforts. She has agreed to [employer]. Molly [name] has been instructed to work up to a goal  of 150 minutes of combined cardio and strengthening exercise per week for weight loss and overall health benefits. We discussed the following Behavioral Modification Strategies today: increasing lean protein intake, increasing vegetables, work on meal planning and easy cooking plans, and planning for success.  Molly [name] has agreed to follow up with our clinic in 2 weeks. She was informed of the importance of frequent follow up visits to maximize her success with intensive lifestyle modifications for her multiple health  conditions.   OBESITY BEHAVIORAL INTERVENTION VISIT  Today's visit was # 16   Starting weight: 223 lbs Starting date: 08/31/17 Today's weight : Weight: 209 lb (94.8 kg)  Today's date: 06/09/2018 Total lbs lost to date: 14  ASK: We discussed the diagnosis of obesity with Molly [name] today and Molly [name] agreed to give [name] permission to discuss obesity behavioral modification therapy today.  ASSESS: Molly [name] has the diagnosis of obesity and her BMI today is 37.03. [place] is in the action stage of change   ADVISE: Molly [name] was educated on the multiple health risks of obesity as well as the benefit of weight loss to improve her health. She was advised of the need for long term treatment and the importance of lifestyle modifications to improve her current health and to decrease her risk of future health problems.  AGREE: Multiple dietary modification options and treatment options were discussed and Molly [name] agreed to follow the recommendations documented in the above note.  ARRANGE: Molly [name] was educated on the importance of frequent visits to treat obesity as outlined per CMS and USPSTF guidelines and agreed to schedule her next follow up appointment today.  I, Marcille Blanco, am acting as Location manager for Eber Jones, MD  I have reviewed the above documentation for accuracy and completeness, and I agree with the above. - Ilene Qua, MD

## 2018-07-07 ENCOUNTER — Ambulatory Visit (INDEPENDENT_AMBULATORY_CARE_PROVIDER_SITE_OTHER): Payer: Medicare HMO | Admitting: Family Medicine

## 2018-07-07 ENCOUNTER — Encounter (INDEPENDENT_AMBULATORY_CARE_PROVIDER_SITE_OTHER): Payer: Self-pay | Admitting: Family Medicine

## 2018-07-07 VITALS — BP 171/77 | HR 90 | Temp 98.0°F | Ht 63.0 in | Wt 207.0 lb

## 2018-07-07 DIAGNOSIS — E1165 Type 2 diabetes mellitus with hyperglycemia: Secondary | ICD-10-CM

## 2018-07-07 DIAGNOSIS — Z6836 Body mass index (BMI) 36.0-36.9, adult: Secondary | ICD-10-CM

## 2018-07-07 DIAGNOSIS — I10 Essential (primary) hypertension: Secondary | ICD-10-CM

## 2018-07-11 NOTE — Progress Notes (Signed)
Office: [phone]  /  Fax: [phone]   HPI:   Chief Complaint: OBESITY Molly [name] is here to discuss her progress with her obesity treatment plan. She is on the Pescatarian eating plan and is following her eating plan approximately 75 % of the time. She states she is exercising 0 minutes 0 times per week. Molly [name] had an enjoyable Thanksgiving and was able to indulge a bit but didn't overindulge. She has a Christmas party December 15th and only celebrates Christmas on Christmas Day.  Her weight is 207 lb (93.9 kg) today and has had a weight loss of 2 pounds over a period of 4 weeks since her last visit. She has lost 16 lbs since starting treatment with [name].  Hypertension Molly [name] is a 72 y.o. female with hypertension. Molly [name]'s blood pressure is elevated today. She denies chest pain, chest pressure, or headaches. She is working weight loss to help control her blood pressure with the goal of decreasing her risk of heart attack and stroke. Molly [name]'s blood pressure is not currently controlled.  Diabetes II with Hyperglycemia Molly [name] has a diagnosis of diabetes type II. Molly [name] is on metformin, ARB, and statin. She denies GI side effects of metformin. Last A1c was 5.9. she denies hypoglycemia. She has been working on intensive lifestyle modifications including diet, exercise, and weight loss to help control her blood glucose levels.  ALLERGIES: No Known Allergies  MEDICATIONS: Current Outpatient Medications on File Prior to Visit  Medication Sig Dispense Refill  . amLODipine (NORVASC) 10 MG tablet Take 1 tablet (10 mg total) by mouth daily. 30 tablet 0  . Cholecalciferol (VITAMIN D3) 5000 units CAPS Take 1 capsule by mouth daily.    [name] co-enzyme Q-10 30 MG capsule Take 30 mg by mouth 3 (three) times daily.    . diazepam (VALIUM) 5 MG tablet Take 0.5 tablets (2.5 mg total) by mouth every 8 (eight) hours as needed (vertigo/dizziness). 10 tablet 0  . escitalopram (LEXAPRO) 20 MG tablet     . fluticasone  (FLONASE) 50 MCG/ACT nasal spray Place 2 sprays into both nostrils daily as needed for allergies or rhinitis. 16 g 5  . furosemide (LASIX) 20 MG tablet Take 20 mg by mouth as needed.     [name] levocetirizine (XYZAL) 5 MG tablet Take 1 tablet (5 mg total) by mouth every evening. 30 tablet 5  . losartan-hydrochlorothiazide (HYZAAR) 100-25 MG tablet Take 1 tablet by mouth daily. 30 tablet 0  . metFORMIN (GLUCOPHAGE) 500 MG tablet Take 2 in the AM and 1 in the PM 90 tablet 0  . Multiple Vitamins-Minerals (MULTIVITAMIN ADULT PO) Take 1 tablet by mouth daily.    . Omega-3 Fatty Acids (FISH OIL PO) Take 1 tablet by mouth daily.    . ondansetron (ZOFRAN ODT) 4 MG disintegrating tablet Take 1 tablet (4 mg total) by mouth every 8 (eight) hours as needed for nausea or vomiting. 10 tablet 0  . rosuvastatin (CRESTOR) 10 MG tablet Take 1 tablet (10 mg total) by mouth daily. 30 tablet 0   No current facility-administered medications on file prior to visit.     PAST MEDICAL HISTORY: Past Medical History:  Diagnosis Date  . Anxiety   . Constipation   . Depression   . Diabetes type 2, controlled ([place])   . Diarrhea   . Fatigue   . Frequent urination   . GERD (gastroesophageal reflux disease)   . Hyperlipidemia   . Hypertension   . Kidney disease   .  Leg cramps   . Multiple food allergies   . Muscle stiffness   . Seasonal allergies   . Shortness of breath   . Sinus pain   . Swallowing difficulty   . Swelling of extremity   . Wheezing     PAST SURGICAL HISTORY: Past Surgical History:  Procedure Laterality Date  . CHOLECYSTECTOMY  1999  . FOOT SURGERY    . MASTECTOMY      SOCIAL HISTORY: Social History   Tobacco Use  . Smoking status: Former Smoker    Types: Cigarettes    Last attempt to quit: 03/23/1998    Years since quitting: 20.3  . Smokeless tobacco: Never Used  Substance Use Topics  . Alcohol use: Yes    Comment: occasional  . Drug use: No    FAMILY HISTORY: Family History    Problem Relation Age of Onset  . Allergic rhinitis Sister   . Eczema Grandchild   . Eczema Daughter   . Urticaria Daughter   . Lupus Other   . Hypertension Mother   . Stroke Mother   . Obesity Mother   . Hypertension Father   . Heart disease Father   . Sudden death Father   . Asthma Neg Hx   . Atopy Neg Hx   . Angioedema Neg Hx     ROS: Review of Systems  Constitutional: Positive for weight loss.  Cardiovascular: Negative for chest pain.       Negative chest pressure  Neurological: Negative for headaches.  Endo/Heme/Allergies:       Negative hypoglycemia    PHYSICAL EXAM: Blood pressure (!) 171/77, pulse 90, temperature 98 F (36.7 C), temperature source Oral, height 5\' 3"  (1.6 m), weight 207 lb (93.9 kg), SpO2 97 %. Body mass index is 36.67 kg/m. Physical Exam Vitals signs reviewed.  Constitutional:      Appearance: Normal appearance. She is obese.  Cardiovascular:     Rate and Rhythm: Normal rate.  Pulmonary:     Effort: Pulmonary effort is normal.  Musculoskeletal: Normal range of motion.  Skin:    General: Skin is warm and dry.  Neurological:     Mental Status: She is alert and oriented to person, place, and time.  Psychiatric:        Mood and Affect: Mood normal.        Behavior: Behavior normal.     RECENT LABS AND TESTS: BMET    Component Value Date/Time   NA 142 05/05/2018 0822   K 3.7 05/05/2018 0822   CL 101 05/05/2018 0822   CO2 25 05/05/2018 0822   GLUCOSE 106 (H) 05/05/2018 0822   GLUCOSE 134 (H) 09/12/2017 2118   BUN 14 05/05/2018 0822   CREATININE 0.97 05/05/2018 0822   CREATININE 0.90 03/23/2016 1116   CALCIUM 10.1 05/05/2018 0822   GFRNONAA 59 (L) 05/05/2018 0822   GFRAA 67 05/05/2018 0822   Lab Results  Component Value Date   HGBA1C 5.9 (H) 05/05/2018   HGBA1C 6.0 (H) 12/21/2017   HGBA1C 6.5 (H) 08/31/2017   HGBA1C (H) 08/31/2010    6.0 (NOTE)  According  to the ADA Clinical Practice Recommendations for 2011, when HbA1c is used as a screening test:   >=6.5%   Diagnostic of Diabetes Mellitus           (if abnormal result  is confirmed)  5.7-6.4%   Increased risk of developing Diabetes Mellitus  References:Diagnosis and Classification of Diabetes Mellitus,Diabetes ZSWF,0932,35(TDDUK 1):S62-S69 and Standards of Medical Care in         Diabetes - 2011,Diabetes GURK,2706,23  (Suppl 1):S11-S61.   Lab Results  Component Value Date   INSULIN 17.2 05/05/2018   INSULIN 17.7 12/21/2017   INSULIN 20.4 08/31/2017   CBC    Component Value Date/Time   WBC 4.4 09/12/2017 2118   RBC 4.72 09/12/2017 2118   HGB 15.0 09/12/2017 2118   HGB 14.0 08/31/2017 1140   HCT 43.7 09/12/2017 2118   HCT 41.2 08/31/2017 1140   PLT 173 09/12/2017 2118   MCV 92.6 09/12/2017 2118   MCV 92 08/31/2017 1140   MCH 31.8 09/12/2017 2118   MCHC 34.3 09/12/2017 2118   RDW 13.1 09/12/2017 2118   RDW 13.7 08/31/2017 1140   LYMPHSABS 1.7 08/31/2017 1140   MONOABS 324 03/23/2016 1116   EOSABS 0.1 08/31/2017 1140   BASOSABS 0.0 08/31/2017 1140   Iron/TIBC/Ferritin/ %Sat No results found for: IRON, TIBC, FERRITIN, IRONPCTSAT Lipid Panel     Component Value Date/Time   CHOL 150 08/31/2017 1140   TRIG 105 08/31/2017 1140   HDL 66 08/31/2017 1140   CHOLHDL 2.6 08/31/2010 0540   VLDL 12 08/31/2010 0540   LDLCALC 63 08/31/2017 1140   Hepatic Function Panel     Component Value Date/Time   PROT 7.3 05/05/2018 0822   ALBUMIN 4.7 05/05/2018 0822   AST 18 05/05/2018 0822   ALT 11 05/05/2018 0822   ALKPHOS 47 05/05/2018 0822   BILITOT 0.3 05/05/2018 0822      Component Value Date/Time   TSH 1.680 08/31/2017 1140   TSH 1.57 03/23/2016 1116    ASSESSMENT AND PLAN: Essential hypertension  Type 2 diabetes mellitus with hyperglycemia, without long-term current use of insulin (HCC)  Class 2 severe obesity with serious comorbidity and body mass index (BMI) of 36.0 to  36.9 in adult, unspecified obesity type ([place])  PLAN:  Hypertension We discussed sodium restriction, working on healthy weight loss, and a regular exercise program as the means to achieve improved blood pressure control. Lendora agreed with this plan and agreed to follow up as directed. We will continue to monitor her blood pressure as well as her progress with the above lifestyle modifications. Molly [name] agrees to continue taking her current medications and will watch for signs of hypotension as she continues her lifestyle modifications. We will follow up on blood pressure at next appointment. Molly [name] agrees to follow up with our clinic in 3 weeks.  Diabetes II with Hyperglycemia Molly [name] has been given extensive diabetes education by myself today including ideal fasting and post-prandial blood glucose readings, individual ideal Hgb A1c goals and hypoglycemia prevention. We discussed the importance of good blood sugar control to decrease the likelihood of diabetic complications such as nephropathy, neuropathy, limb loss, blindness, coronary artery disease, and death. We discussed the importance of intensive lifestyle modification including diet, exercise and weight loss as the first line treatment for diabetes. Camren agrees to continue her current diabetes medications, and she agrees to follow up with our clinic in 3 weeks.  I spent > than 50% of the 15 minute visit on counseling  as documented in the note.  Obesity Molly [name] is currently in the action stage of change. As such, her goal is to continue with weight loss efforts She has agreed to follow the Pescatarian eating plan Berdie has been instructed to work up to a goal of 150 minutes of combined cardio and strengthening exercise per week for weight loss and overall health benefits. We discussed the following Behavioral Modification Strategies today: increasing lean protein intake, work on meal planning and easy cooking plans, holiday eating strategies, better  snacking choices, and planning for success    Molly [name] has agreed to follow up with our clinic in 3 weeks. She was informed of the importance of frequent follow up visits to maximize her success with intensive lifestyle modifications for her multiple health conditions.   OBESITY BEHAVIORAL INTERVENTION VISIT  Today's visit was # 17   Starting weight: 223 lbs Starting date: 08/31/17 Today's weight : 207 lbs  Today's date: 07/07/2018 Total lbs lost to date: 16    ASK: We discussed the diagnosis of obesity with Molly [name] today and Molly [name] agreed to give [name] permission to discuss obesity behavioral modification therapy today.  ASSESS: Molly [name] has the diagnosis of obesity and her BMI today is 36.68 Molly [name] is in the action stage of change   ADVISE: Molly [name] was educated on the multiple health risks of obesity as well as the benefit of weight loss to improve her health. She was advised of the need for long term treatment and the importance of lifestyle modifications to improve her current health and to decrease her risk of future health problems.  AGREE: Multiple dietary modification options and treatment options were discussed and  Molly [name] agreed to follow the recommendations documented in the above note.  ARRANGE: Molly [name] was educated on the importance of frequent visits to treat obesity as outlined per CMS and USPSTF guidelines and agreed to schedule her next follow up appointment today.  I, Molly [name], am acting as transcriptionist for Molly Qua, MD  I have reviewed the above documentation for accuracy and completeness, and I agree with the above. - Molly Qua, MD

## 2018-08-04 ENCOUNTER — Encounter (INDEPENDENT_AMBULATORY_CARE_PROVIDER_SITE_OTHER): Payer: Self-pay

## 2018-08-04 ENCOUNTER — Ambulatory Visit (INDEPENDENT_AMBULATORY_CARE_PROVIDER_SITE_OTHER): Payer: Medicare HMO | Admitting: Family Medicine

## 2018-08-16 ENCOUNTER — Encounter (INDEPENDENT_AMBULATORY_CARE_PROVIDER_SITE_OTHER): Payer: Self-pay | Admitting: Family Medicine

## 2018-08-16 ENCOUNTER — Ambulatory Visit (INDEPENDENT_AMBULATORY_CARE_PROVIDER_SITE_OTHER): Payer: Medicare HMO | Admitting: Family Medicine

## 2018-08-16 VITALS — BP 155/83 | HR 97 | Temp 97.9°F | Ht 63.0 in | Wt 209.0 lb

## 2018-08-16 DIAGNOSIS — Z6837 Body mass index (BMI) 37.0-37.9, adult: Secondary | ICD-10-CM

## 2018-08-16 DIAGNOSIS — I10 Essential (primary) hypertension: Secondary | ICD-10-CM

## 2018-08-16 DIAGNOSIS — E559 Vitamin D deficiency, unspecified: Secondary | ICD-10-CM | POA: Diagnosis not present

## 2018-08-17 NOTE — Progress Notes (Signed)
Office: [phone]  /  Fax: [phone]   HPI:   Chief Complaint: OBESITY Molly [name] is here to discuss her progress with her obesity treatment plan. She is on the Pescartarian eating plan and is following her eating plan approximately 80 % of the time. She states she is exercising 0 minutes 0 times per week. Molly [name] missed her appointment and had to reschedule. She states she has been indulging in cakes and sweets. February is her anniversary with her husband. She has food in the house.  Her weight is 209 lb (94.8 kg) today and has gained 2 pounds since her last visit. She has lost 14 lbs since starting treatment with Molly [name].  Vitamin D Deficiency Molly [name] has a diagnosis of vitamin D deficiency. She takes OTC Vit D 5,000 IU daily. She notes fatigue and denies nausea, vomiting or muscle weakness.  Hypertension Molly [name] is a 73 y.o. female with hypertension. Mckennah's blood pressure elevated today. She reports she was rushing to get here. She reports she has been indulging in salt and sugar. She denies chest pain. She is working on weight loss to help control her blood pressure with the goal of decreasing her risk of heart attack and stroke. Molly [name]'s blood pressure is not currently controlled.  ASSESSMENT AND PLAN:  Vitamin D deficiency  Essential hypertension  Class 2 severe obesity with serious comorbidity and body mass index (BMI) of 37.0 to 37.9 in adult, unspecified obesity type ([place])  PLAN:  Vitamin D Deficiency Molly [name] was informed that low vitamin D levels contributes to fatigue and are associated with obesity, breast, and colon cancer. Molly [name] agrees to continue taking OTC Vit D and will follow up for routine testing of vitamin D, at least 2-3 times per year. She was informed of the risk of over-replacement of vitamin D and agrees to not increase her dose unless she discusses this with Molly [name] first. Molly [name] agrees to follow up with our clinic in 2 weeks.  Hypertension We discussed sodium restriction,  working on healthy weight loss, and a regular exercise program as the means to achieve improved blood pressure control. Molly [name] agreed with this plan and agreed to follow up as directed. We will continue to monitor her blood pressure as well as her progress with the above lifestyle modifications. Molly [name] agrees to continue her medications and will watch for signs of hypotension as she continues her lifestyle modifications. We will follow up on blood pressure at next appointment, if blood pressure is elevated again then will need to consider changing medications. Molly [name] agrees to follow up with our clinic in 2 weeks.  I spent > than 50% of the 15 minute visit on counseling as documented in the note.  Obesity Molly [name] is currently in the action stage of change. As such, her goal is to continue with weight loss efforts She has agreed to follow the Category 2 plan Molly [name] has been instructed to work up to a goal of 150 minutes of combined cardio and strengthening exercise per week for weight loss and overall health benefits. We discussed the following Behavioral Modification Strategies today: increasing lean protein intake, increasing vegetables, work on meal planning and easy cooking plans, and celebrations eating strategies    Molly [name] has agreed to follow up with our clinic in 2 weeks. She was informed of the importance of frequent follow up visits to maximize her success with intensive lifestyle modifications for her multiple health conditions.  ALLERGIES: No Known Allergies  MEDICATIONS: Current Outpatient Medications on File Prior  to Visit  Medication Sig Dispense Refill  . amLODipine (NORVASC) 10 MG tablet Take 1 tablet (10 mg total) by mouth daily. 30 tablet 0  . Cholecalciferol (VITAMIN D3) 5000 units CAPS Take 1 capsule by mouth daily.    [name] co-enzyme Q-10 30 MG capsule Take 30 mg by mouth 3 (three) times daily.    . diazepam (VALIUM) 5 MG tablet Take 0.5 tablets (2.5 mg total) by mouth every 8 (eight)  hours as needed (vertigo/dizziness). 10 tablet 0  . escitalopram (LEXAPRO) 20 MG tablet     . fluticasone (FLONASE) 50 MCG/ACT nasal spray Place 2 sprays into both nostrils daily as needed for allergies or rhinitis. 16 g 5  . furosemide (LASIX) 20 MG tablet Take 20 mg by mouth as needed.     [name] levocetirizine (XYZAL) 5 MG tablet Take 1 tablet (5 mg total) by mouth every evening. 30 tablet 5  . losartan-hydrochlorothiazide (HYZAAR) 100-25 MG tablet Take 1 tablet by mouth daily. 30 tablet 0  . metFORMIN (GLUCOPHAGE) 500 MG tablet Take 2 in the AM and 1 in the PM 90 tablet 0  . Multiple Vitamins-Minerals (MULTIVITAMIN ADULT PO) Take 1 tablet by mouth daily.    . Omega-3 Fatty Acids (FISH OIL PO) Take 1 tablet by mouth daily.    . ondansetron (ZOFRAN ODT) 4 MG disintegrating tablet Take 1 tablet (4 mg total) by mouth every 8 (eight) hours as needed for nausea or vomiting. 10 tablet 0  . rosuvastatin (CRESTOR) 10 MG tablet Take 1 tablet (10 mg total) by mouth daily. 30 tablet 0   No current facility-administered medications on file prior to visit.     PAST MEDICAL HISTORY: Past Medical History:  Diagnosis Date  . Anxiety   . Constipation   . Depression   . Diabetes type 2, controlled ([place])   . Diarrhea   . Fatigue   . Frequent urination   . GERD (gastroesophageal reflux disease)   . Hyperlipidemia   . Hypertension   . Kidney disease   . Leg cramps   . Multiple food allergies   . Muscle stiffness   . Seasonal allergies   . Shortness of breath   . Sinus pain   . Swallowing difficulty   . Swelling of extremity   . Wheezing     PAST SURGICAL HISTORY: Past Surgical History:  Procedure Laterality Date  . CHOLECYSTECTOMY  1999  . FOOT SURGERY    . MASTECTOMY      SOCIAL HISTORY: Social History   Tobacco Use  . Smoking status: Former Smoker    Types: Cigarettes    Last attempt to quit: 03/23/1998    Years since quitting: 20.4  . Smokeless tobacco: Never Used  Substance Use  Topics  . Alcohol use: Yes    Comment: occasional  . Drug use: No    FAMILY HISTORY: Family History  Problem Relation Age of Onset  . Allergic rhinitis Sister   . Eczema Grandchild   . Eczema Daughter   . Urticaria Daughter   . Lupus Other   . Hypertension Mother   . Stroke Mother   . Obesity Mother   . Hypertension Father   . Heart disease Father   . Sudden death Father   . Asthma Neg Hx   . Atopy Neg Hx   . Angioedema Neg Hx     ROS: Review of Systems  Constitutional: Positive for malaise/fatigue. Negative for weight loss.  Cardiovascular: Negative for chest pain.  Gastrointestinal: Negative for nausea and vomiting.  Musculoskeletal:       Negative muscle weakness    PHYSICAL EXAM: Blood pressure (!) 155/83, pulse 97, temperature 97.9 F (36.6 C), temperature source Oral, height 5\' 3"  (1.6 m), weight 209 lb (94.8 kg), SpO2 97 %. Body mass index is 37.02 kg/m. Physical Exam Vitals signs reviewed.  Constitutional:      Appearance: Normal appearance. She is obese.  Cardiovascular:     Rate and Rhythm: Normal rate.     Pulses: Normal pulses.  Pulmonary:     Effort: Pulmonary effort is normal.     Breath sounds: Normal breath sounds.  Musculoskeletal: Normal range of motion.  Skin:    General: Skin is warm and dry.  Neurological:     Mental Status: She is alert and oriented to person, place, and time.  Psychiatric:        Mood and Affect: Mood normal.        Behavior: Behavior normal.     RECENT LABS AND TESTS: BMET    Component Value Date/Time   NA 142 05/05/2018 0822   K 3.7 05/05/2018 0822   CL 101 05/05/2018 0822   CO2 25 05/05/2018 0822   GLUCOSE 106 (H) 05/05/2018 0822   GLUCOSE 134 (H) 09/12/2017 2118   BUN 14 05/05/2018 0822   CREATININE 0.97 05/05/2018 0822   CREATININE 0.90 03/23/2016 1116   CALCIUM 10.1 05/05/2018 0822   GFRNONAA 59 (L) 05/05/2018 0822   GFRAA 67 05/05/2018 0822   Lab Results  Component Value Date   HGBA1C 5.9 (H)  05/05/2018   HGBA1C 6.0 (H) 12/21/2017   HGBA1C 6.5 (H) 08/31/2017   HGBA1C (H) 08/31/2010    6.0 (NOTE)                                                                       According to the ADA Clinical Practice Recommendations for 2011, when HbA1c is used as a screening test:   >=6.5%   Diagnostic of Diabetes Mellitus           (if abnormal result  is confirmed)  5.7-6.4%   Increased risk of developing Diabetes Mellitus  References:Diagnosis and Classification of Diabetes Mellitus,Diabetes YTKZ,6010,93(ATFTD 1):S62-S69 and Standards of Medical Care in         Diabetes - 2011,Diabetes Care,2011,34  (Suppl 1):S11-S61.   Lab Results  Component Value Date   INSULIN 17.2 05/05/2018   INSULIN 17.7 12/21/2017   INSULIN 20.4 08/31/2017   CBC    Component Value Date/Time   WBC 4.4 09/12/2017 2118   RBC 4.72 09/12/2017 2118   HGB 15.0 09/12/2017 2118   HGB 14.0 08/31/2017 1140   HCT 43.7 09/12/2017 2118   HCT 41.2 08/31/2017 1140   PLT 173 09/12/2017 2118   MCV 92.6 09/12/2017 2118   MCV 92 08/31/2017 1140   MCH 31.8 09/12/2017 2118   MCHC 34.3 09/12/2017 2118   RDW 13.1 09/12/2017 2118   RDW 13.7 08/31/2017 1140   LYMPHSABS 1.7 08/31/2017 1140   MONOABS 324 03/23/2016 1116   EOSABS 0.1 08/31/2017 1140   BASOSABS 0.0 08/31/2017 1140   Iron/TIBC/Ferritin/ %Sat No results found for: IRON, TIBC, FERRITIN, IRONPCTSAT Lipid Panel  Component Value Date/Time   CHOL 150 08/31/2017 1140   TRIG 105 08/31/2017 1140   HDL 66 08/31/2017 1140   CHOLHDL 2.6 08/31/2010 0540   VLDL 12 08/31/2010 0540   LDLCALC 63 08/31/2017 1140   Hepatic Function Panel     Component Value Date/Time   PROT 7.3 05/05/2018 0822   ALBUMIN 4.7 05/05/2018 0822   AST 18 05/05/2018 0822   ALT 11 05/05/2018 0822   ALKPHOS 47 05/05/2018 0822   BILITOT 0.3 05/05/2018 0822      Component Value Date/Time   TSH 1.680 08/31/2017 1140   TSH 1.57 03/23/2016 1116      OBESITY BEHAVIORAL INTERVENTION  VISIT  Today's visit was # 18   Starting weight: 223 lbs Starting date: 08/31/17 Today's weight : 209 lbs  Today's date: 08/16/2018 Total lbs lost to date: 14    ASK: We discussed the diagnosis of obesity with Kerry Hough today and [name] agreed to give Molly [name] permission to discuss obesity behavioral modification therapy today.  ASSESS: Vastie has the diagnosis of obesity and her BMI today is 37.03 Venessa is in the action stage of change   ADVISE: Esmay was educated on the multiple health risks of obesity as well as the benefit of weight loss to improve her health. She was advised of the need for long term treatment and the importance of lifestyle modifications to improve her current health and to decrease her risk of future health problems.  AGREE: Multiple dietary modification options and treatment options were discussed and  Allyana agreed to follow the recommendations documented in the above note.  ARRANGE: Lashonda was educated on the importance of frequent visits to treat obesity as outlined per CMS and USPSTF guidelines and agreed to schedule her next follow up appointment today.  I, Trixie Dredge, am acting as transcriptionist for Ilene Qua, MD  I have reviewed the above documentation for accuracy and completeness, and I agree with the above. - Ilene Qua, MD

## 2018-09-01 ENCOUNTER — Ambulatory Visit (INDEPENDENT_AMBULATORY_CARE_PROVIDER_SITE_OTHER): Payer: Medicare HMO | Admitting: Family Medicine

## 2018-09-08 ENCOUNTER — Ambulatory Visit (INDEPENDENT_AMBULATORY_CARE_PROVIDER_SITE_OTHER): Payer: Medicare HMO | Admitting: Family Medicine

## 2018-09-08 ENCOUNTER — Encounter (INDEPENDENT_AMBULATORY_CARE_PROVIDER_SITE_OTHER): Payer: Self-pay | Admitting: Family Medicine

## 2018-09-08 VITALS — BP 153/78 | HR 109 | Temp 98.4°F | Ht 63.0 in | Wt 206.0 lb

## 2018-09-08 DIAGNOSIS — Z6836 Body mass index (BMI) 36.0-36.9, adult: Secondary | ICD-10-CM

## 2018-09-08 DIAGNOSIS — I10 Essential (primary) hypertension: Secondary | ICD-10-CM | POA: Diagnosis not present

## 2018-09-08 DIAGNOSIS — E1165 Type 2 diabetes mellitus with hyperglycemia: Secondary | ICD-10-CM

## 2018-09-08 MED ORDER — CHLORTHALIDONE 25 MG PO TABS: 25.0000 mg | ORAL_TABLET | Freq: Every day | ORAL | 0 refills | Status: DC

## 2018-09-08 MED ORDER — LOSARTAN POTASSIUM 100 MG PO TABS
100.0000 mg | ORAL_TABLET | Freq: Every day | ORAL | 0 refills | Status: DC
Start: 2018-09-08 — End: 2018-09-29

## 2018-09-10 NOTE — Progress Notes (Signed)
Office: [phone]  /  Fax: [phone]   HPI:   Chief Complaint: OBESITY Molly [name] is here to discuss her progress with her obesity treatment plan. She is on the Category 2 plan and is following her eating plan approximately 70-75 % of the time. She states she is exercising 0 minutes 0 times per week. Molly [name] has been busy the last few weeks with her wedding anniversary, and then went out of town for a few days visiting family. She has no travel plans until spring break.  Her weight is 206 lb (93.4 kg) today and has had a weight loss of 3 pounds over a period of 3 weeks since her last visit. She has lost 17 lbs since starting treatment with [name].  Hypertension Molly [name] is a 73 y.o. female with hypertension. Molly [name]'s blood pressure is slightly elevated. She states she had a stressful drive from [facility] for her appointment. She denies chest pain, chest pressure, or headaches. She is working on weight loss to help control her blood pressure with the goal of decreasing her risk of heart attack and stroke. Stepahnie's blood pressure is not currently controlled.  Diabetes II with Hyperglycemia Porfiria has a diagnosis of diabetes type II. Molly [name] denies carbohydrate cravings or GI side effects of metformin. Last A1c was 5.9. She has been working on intensive lifestyle modifications including diet, exercise, and weight loss to help control her blood glucose levels.   ASSESSMENT AND PLAN:  Essential hypertension - Plan: chlorthalidone (HYGROTON) 25 MG tablet, losartan (COZAAR) 100 MG tablet  Type 2 diabetes mellitus with hyperglycemia, without long-term current use of insulin (HCC)  Class 2 severe obesity with serious comorbidity and body mass index (BMI) of 36.0 to 36.9 in adult, unspecified obesity type ([place])  PLAN:  Hypertension We discussed sodium restriction, working on healthy weight loss, and a regular exercise program as the means to achieve improved blood pressure control. [name] agreed with this  plan and agreed to follow up as directed. We will continue to monitor her blood pressure as well as her progress with the above lifestyle modifications. [name] agrees to start losartan 100 mg PO daily #30 with no refills, and she agrees to start chlorthalidone 25 mg PO daily #30 with no refills and stop Hyzaar. She will watch for signs of hypotension as she continues her lifestyle modifications. We will follow up on blood pressure at next appointment. Phelan agrees to follow up with our clinic in 2 weeks.  Diabetes II with Hyperglycemia Molly [name] has been given extensive diabetes education by myself today including ideal fasting and post-prandial blood glucose readings, individual ideal Hgb A1c goals and hypoglycemia prevention. We discussed the importance of good blood sugar control to decrease the likelihood of diabetic complications such as nephropathy, neuropathy, limb loss, blindness, coronary artery disease, and death. We discussed the importance of intensive lifestyle modification including diet, exercise and weight loss as the first line treatment for diabetes. Molly [name] agrees to continue her diabetes medications and we will check labs at next appointment. [name] agrees to follow up with our clinic in 2 weeks.  Obesity Molly [name] is currently in the action stage of change. As such, her goal is to continue with weight loss efforts She has agreed to follow the Category 2 plan Molly [name] has been instructed to work up to a goal of 150 minutes of combined cardio and strengthening exercise per week for weight loss and overall health benefits. We discussed the following Behavioral Modification Strategies today: increasing lean protein  intake, increasing vegetables, work on meal planning and easy cooking plans, travel eating strategies, and planning for success    Molly [name] has agreed to follow up with our clinic in 2 weeks. She was informed of the importance of frequent follow up visits to maximize her success with intensive  lifestyle modifications for her multiple health conditions.  ALLERGIES: No Known Allergies  MEDICATIONS: Current Outpatient Medications on File Prior to Visit  Medication Sig Dispense Refill  . amLODipine (NORVASC) 10 MG tablet Take 1 tablet (10 mg total) by mouth daily. 30 tablet 0  . Cholecalciferol (VITAMIN D3) 5000 units CAPS Take 1 capsule by mouth daily.    [name] co-enzyme Q-10 30 MG capsule Take 30 mg by mouth 3 (three) times daily.    . diazepam (VALIUM) 5 MG tablet Take 0.5 tablets (2.5 mg total) by mouth every 8 (eight) hours as needed (vertigo/dizziness). 10 tablet 0  . escitalopram (LEXAPRO) 20 MG tablet     . fluticasone (FLONASE) 50 MCG/ACT nasal spray Place 2 sprays into both nostrils daily as needed for allergies or rhinitis. 16 g 5  . furosemide (LASIX) 20 MG tablet Take 20 mg by mouth as needed.     [name] levocetirizine (XYZAL) 5 MG tablet Take 1 tablet (5 mg total) by mouth every evening. 30 tablet 5  . metFORMIN (GLUCOPHAGE) 500 MG tablet Take 2 in the AM and 1 in the PM 90 tablet 0  . Multiple Vitamins-Minerals (MULTIVITAMIN ADULT PO) Take 1 tablet by mouth daily.    . Omega-3 Fatty Acids (FISH OIL PO) Take 1 tablet by mouth daily.    . ondansetron (ZOFRAN ODT) 4 MG disintegrating tablet Take 1 tablet (4 mg total) by mouth every 8 (eight) hours as needed for nausea or vomiting. 10 tablet 0  . rosuvastatin (CRESTOR) 10 MG tablet Take 1 tablet (10 mg total) by mouth daily. 30 tablet 0   No current facility-administered medications on file prior to visit.     PAST MEDICAL HISTORY: Past Medical History:  Diagnosis Date  . Anxiety   . Constipation   . Depression   . Diabetes type 2, controlled ([place])   . Diarrhea   . Fatigue   . Frequent urination   . GERD (gastroesophageal reflux disease)   . Hyperlipidemia   . Hypertension   . Kidney disease   . Leg cramps   . Multiple food allergies   . Muscle stiffness   . Seasonal allergies   . Shortness of breath   . Sinus  pain   . Swallowing difficulty   . Swelling of extremity   . Wheezing     PAST SURGICAL HISTORY: Past Surgical History:  Procedure Laterality Date  . CHOLECYSTECTOMY  1999  . FOOT SURGERY    . MASTECTOMY      SOCIAL HISTORY: Social History   Tobacco Use  . Smoking status: Former Smoker    Types: Cigarettes    Last attempt to quit: 03/23/1998    Years since quitting: 20.4  . Smokeless tobacco: Never Used  Substance Use Topics  . Alcohol use: Yes    Comment: occasional  . Drug use: No    FAMILY HISTORY: Family History  Problem Relation Age of Onset  . Allergic rhinitis Sister   . Eczema Grandchild   . Eczema Daughter   . Urticaria Daughter   . Lupus Other   . Hypertension Mother   . Stroke Mother   . Obesity Mother   . Hypertension  Father   . Heart disease Father   . Sudden death Father   . Asthma Neg Hx   . Atopy Neg Hx   . Angioedema Neg Hx     ROS: Review of Systems  Constitutional: Positive for weight loss.  Cardiovascular: Negative for chest pain.       Negative chest pressure  Neurological: Negative for headaches.  Endo/Heme/Allergies:       Negative hypoglycemia    PHYSICAL EXAM: Blood pressure (!) 153/78, pulse (!) 109, temperature 98.4 F (36.9 C), temperature source Oral, height 5\' 3"  (1.6 m), weight 206 lb (93.4 kg), SpO2 98 %. Body mass index is 36.49 kg/m. Physical Exam Vitals signs reviewed.  Constitutional:      Appearance: Normal appearance. She is obese.  Cardiovascular:     Rate and Rhythm: Normal rate.     Pulses: Normal pulses.  Pulmonary:     Effort: Pulmonary effort is normal.     Breath sounds: Normal breath sounds.  Musculoskeletal: Normal range of motion.  Skin:    General: Skin is warm and dry.  Neurological:     Mental Status: She is alert and oriented to person, place, and time.  Psychiatric:        Mood and Affect: Mood normal.        Behavior: Behavior normal.     RECENT LABS AND TESTS: BMET    Component  Value Date/Time   NA 142 05/05/2018 0822   K 3.7 05/05/2018 0822   CL 101 05/05/2018 0822   CO2 25 05/05/2018 0822   GLUCOSE 106 (H) 05/05/2018 0822   GLUCOSE 134 (H) 09/12/2017 2118   BUN 14 05/05/2018 0822   CREATININE 0.97 05/05/2018 0822   CREATININE 0.90 03/23/2016 1116   CALCIUM 10.1 05/05/2018 0822   GFRNONAA 59 (L) 05/05/2018 0822   GFRAA 67 05/05/2018 0822   Lab Results  Component Value Date   HGBA1C 5.9 (H) 05/05/2018   HGBA1C 6.0 (H) 12/21/2017   HGBA1C 6.5 (H) 08/31/2017   HGBA1C (H) 08/31/2010    6.0 (NOTE)                                                                       According to the ADA Clinical Practice Recommendations for 2011, when HbA1c is used as a screening test:   >=6.5%   Diagnostic of Diabetes Mellitus           (if abnormal result  is confirmed)  5.7-6.4%   Increased risk of developing Diabetes Mellitus  References:Diagnosis and Classification of Diabetes Mellitus,Diabetes BJSE,8315,17(OHYWV 1):S62-S69 and Standards of Medical Care in         Diabetes - 2011,Diabetes Care,2011,34  (Suppl 1):S11-S61.   Lab Results  Component Value Date   INSULIN 17.2 05/05/2018   INSULIN 17.7 12/21/2017   INSULIN 20.4 08/31/2017   CBC    Component Value Date/Time   WBC 4.4 09/12/2017 2118   RBC 4.72 09/12/2017 2118   HGB 15.0 09/12/2017 2118   HGB 14.0 08/31/2017 1140   HCT 43.7 09/12/2017 2118   HCT 41.2 08/31/2017 1140   PLT 173 09/12/2017 2118   MCV 92.6 09/12/2017 2118   MCV 92 08/31/2017 1140   MCH 31.8 09/12/2017 2118  MCHC 34.3 09/12/2017 2118   RDW 13.1 09/12/2017 2118   RDW 13.7 08/31/2017 1140   LYMPHSABS 1.7 08/31/2017 1140   MONOABS 324 03/23/2016 1116   EOSABS 0.1 08/31/2017 1140   BASOSABS 0.0 08/31/2017 1140   Iron/TIBC/Ferritin/ %Sat No results found for: IRON, TIBC, FERRITIN, IRONPCTSAT Lipid Panel     Component Value Date/Time   CHOL 150 08/31/2017 1140   TRIG 105 08/31/2017 1140   HDL 66 08/31/2017 1140   CHOLHDL 2.6  08/31/2010 0540   VLDL 12 08/31/2010 0540   LDLCALC 63 08/31/2017 1140   Hepatic Function Panel     Component Value Date/Time   PROT 7.3 05/05/2018 0822   ALBUMIN 4.7 05/05/2018 0822   AST 18 05/05/2018 0822   ALT 11 05/05/2018 0822   ALKPHOS 47 05/05/2018 0822   BILITOT 0.3 05/05/2018 0822      Component Value Date/Time   TSH 1.680 08/31/2017 1140   TSH 1.57 03/23/2016 1116      OBESITY BEHAVIORAL INTERVENTION VISIT  Today's visit was # 38   Starting weight: 223 lbs Starting date: 08/31/17 Today's weight : 206 lbs  Today's date: 09/08/2018 Total lbs lost to date: 17 At least 15 minutes were spent on discussing the following behavioral intervention visit.   ASK: We discussed the diagnosis of obesity with Kerry Hough today and Deema agreed to give [name] permission to discuss obesity behavioral modification therapy today.  ASSESS: Itzel has the diagnosis of obesity and her BMI today is 22.5 Adelis is in the action stage of change   ADVISE: Morena was educated on the multiple health risks of obesity as well as the benefit of weight loss to improve her health. She was advised of the need for long term treatment and the importance of lifestyle modifications to improve her current health and to decrease her risk of future health problems.  AGREE: Multiple dietary modification options and treatment options were discussed and  Wauneta agreed to follow the recommendations documented in the above note.  ARRANGE: Vona was educated on the importance of frequent visits to treat obesity as outlined per CMS and USPSTF guidelines and agreed to schedule her next follow up appointment today.  I, Trixie Dredge, am acting as transcriptionist for Ilene Qua, MD  I have reviewed the above documentation for accuracy and completeness, and I agree with the above. - Ilene Qua, MD

## 2018-09-28 ENCOUNTER — Ambulatory Visit (INDEPENDENT_AMBULATORY_CARE_PROVIDER_SITE_OTHER): Payer: Medicare HMO | Admitting: Family Medicine

## 2018-09-29 ENCOUNTER — Other Ambulatory Visit (INDEPENDENT_AMBULATORY_CARE_PROVIDER_SITE_OTHER): Payer: Self-pay | Admitting: Family Medicine

## 2018-09-29 ENCOUNTER — Encounter (INDEPENDENT_AMBULATORY_CARE_PROVIDER_SITE_OTHER): Payer: Self-pay | Admitting: Family Medicine

## 2018-09-29 ENCOUNTER — Ambulatory Visit (INDEPENDENT_AMBULATORY_CARE_PROVIDER_SITE_OTHER): Payer: Medicare HMO | Admitting: Family Medicine

## 2018-09-29 VITALS — BP 120/77 | HR 87 | Temp 97.9°F | Ht 63.0 in | Wt 207.0 lb

## 2018-09-29 DIAGNOSIS — E669 Obesity, unspecified: Secondary | ICD-10-CM

## 2018-09-29 DIAGNOSIS — E559 Vitamin D deficiency, unspecified: Secondary | ICD-10-CM

## 2018-09-29 DIAGNOSIS — I10 Essential (primary) hypertension: Secondary | ICD-10-CM | POA: Diagnosis not present

## 2018-09-29 DIAGNOSIS — Z6834 Body mass index (BMI) 34.0-34.9, adult: Secondary | ICD-10-CM

## 2018-09-29 DIAGNOSIS — Z794 Long term (current) use of insulin: Secondary | ICD-10-CM

## 2018-09-29 DIAGNOSIS — E1165 Type 2 diabetes mellitus with hyperglycemia: Secondary | ICD-10-CM

## 2018-09-29 MED ORDER — CHLORTHALIDONE 25 MG PO TABS: 25.0000 mg | ORAL_TABLET | Freq: Every day | ORAL | 0 refills | Status: DC

## 2018-09-29 MED ORDER — LOSARTAN POTASSIUM 100 MG PO TABS: 100.0000 mg | ORAL_TABLET | Freq: Every day | ORAL | 0 refills | Status: DC

## 2018-09-30 LAB — COMPREHENSIVE METABOLIC PANEL
ALT: 16 IU/L (ref 0–32)
AST: 18 IU/L (ref 0–40)
Albumin/Globulin Ratio: 2.1 (ref 1.2–2.2)
Albumin: 4.8 g/dL — ABNORMAL HIGH (ref 3.7–4.7)
Alkaline Phosphatase: 54 IU/L (ref 39–117)
BUN/Creatinine Ratio: 16 (ref 12–28)
BUN: 15 mg/dL (ref 8–27)
Bilirubin Total: 0.3 mg/dL (ref 0.0–1.2)
CHLORIDE: 100 mmol/L (ref 96–106)
CO2: 25 mmol/L (ref 20–29)
CREATININE: 0.95 mg/dL (ref 0.57–1.00)
Calcium: 10.5 mg/dL — ABNORMAL HIGH (ref 8.7–10.3)
GFR calc Af Amer: 69 mL/min/{1.73_m2} (ref >59)
GFR, EST NON AFRICAN AMERICAN: 60 mL/min/{1.73_m2} (ref >59)
Globulin, Total: 2.3 g/dL (ref 1.5–4.5)
Glucose: 116 mg/dL — ABNORMAL HIGH (ref 65–99)
Potassium: 3.6 mmol/L (ref 3.5–5.2)
Sodium: 141 mmol/L (ref 134–144)
Total Protein: 7.1 g/dL (ref 6.0–8.5)

## 2018-09-30 LAB — VITAMIN B12: Vitamin B-12: 792 pg/mL (ref 232–1245)

## 2018-09-30 LAB — HEMOGLOBIN A1C
Est. average glucose Bld gHb Est-mCnc: 128 mg/dL
HEMOGLOBIN A1C: 6.1 % — AB (ref 4.8–5.6)

## 2018-09-30 LAB — INSULIN, RANDOM: INSULIN: 19 u[IU]/mL (ref 2.6–24.9)

## 2018-10-01 NOTE — Progress Notes (Signed)
Office: [phone]  /  Fax: [phone]   HPI:   Chief Complaint: OBESITY Molly [name] is here to discuss her progress with her obesity treatment plan. She is on the Category 2 plan and is following her eating plan approximately 80 % of the time. She states she is exercising with stretch bands for 5-10 minutes 3 times per week. Molly [name] has had a busy few weeks with 2 dinner indulgences. She is not drinking enough water. She may be over snacking when watching her grandchild.  Her weight is 207 lb (93.9 kg) today and has gained 1 pound since her last visit. She has lost 16 lbs since starting treatment with Molly [name].  Hypertension [name] is a 73 y.o. female with hypertension. [name]'s blood pressure is controlled today. She denies chest pain. She is working on weight loss to help control her blood pressure with the goal of decreasing her risk of heart attack and stroke.   Diabetes II with Hyperglycemia [name] has a diagnosis of diabetes type II. [name] denies GI side effects of metformin, she is taking 2 pills in the morning and 1 pill in the evening of metformin. Last A1c was 5.9. She denies hypoglycemia. She has been working on intensive lifestyle modifications including diet, exercise, and weight loss to help control her blood glucose levels.  Vitamin D Deficiency [name] has a diagnosis of vitamin D deficiency. She is currently taking OTC Vit D. She notes fatigue and denies nausea, vomiting or muscle weakness.  ASSESSMENT AND PLAN:  Essential hypertension - Plan: losartan (COZAAR) 100 MG tablet, chlorthalidone (HYGROTON) 25 MG tablet  Type 2 diabetes mellitus with hyperglycemia, with long-term current use of insulin (HCC) - Plan: Comprehensive metabolic panel, Hemoglobin A1c, Insulin, random  Vitamin D deficiency - Plan: Vitamin B12  Class 1 obesity with serious comorbidity and body mass index (BMI) of 34.0 to 34.9 in adult, unspecified obesity type  PLAN:  Hypertension We discussed sodium  restriction, working on healthy weight loss, and a regular exercise program as the means to achieve improved blood pressure control. Molly [name] agreed with this plan and agreed to follow up as directed. We will continue to monitor her blood pressure as well as her progress with the above lifestyle modifications. Molly [name] agrees to continue taking losartan 100 mg PO daily #30 and we will refill for 1 month, and she agrees to continue taking chlrothalidone 25 mg PO daily #30 and we will refill for 1 month. She will watch for signs of hypotension as she continues her lifestyle modifications. We will check CMP today. Molly [name] agrees to follow up with our clinic in 2 weeks.  Diabetes II with Hyperglycemia Molly [name] has been given extensive diabetes education by myself today including ideal fasting and post-prandial blood glucose readings, individual ideal Hgb A1c goals and hypoglycemia prevention. We discussed the importance of good blood sugar control to decrease the likelihood of diabetic complications such as nephropathy, neuropathy, limb loss, blindness, coronary artery disease, and death. We discussed the importance of intensive lifestyle modification including diet, exercise and weight loss as the first line treatment for diabetes. Molly [name] agrees to continue her diabetes medications and we will check Hgb A1c, insulin, and B12 level today. Molly [name] agrees to follow up with our clinic in 2 weeks.  Vitamin D Deficiency Molly [name] was informed that low vitamin D levels contributes to fatigue and are associated with obesity, breast, and colon cancer. Molly [name] agrees to continue taking OTC Vit D and will follow up for routine testing of vitamin  D, at least 2-3 times per year. She was informed of the risk of over-replacement of vitamin D and agrees to not increase her dose unless she discusses this with Molly [name] first. We will check Vit D level today. Molly [name] agrees to follow up with our clinic in 2 weeks.  Obesity Trystan is currently in the action stage of  change. As such, her goal is to continue with weight loss efforts She has agreed to follow the Category 2 plan Molly [name] has been instructed to work up to a goal of 150 minutes of combined cardio and strengthening exercise per week for weight loss and overall health benefits. We discussed the following Behavioral Modification Strategies today: increasing lean protein intake, increasing vegetables and work on meal planning and easy cooking plans, and planning for success   Molly [name] has agreed to follow up with our clinic in 2 weeks. She was informed of the importance of frequent follow up visits to maximize her success with intensive lifestyle modifications for her multiple health conditions.  ALLERGIES: No Known Allergies  MEDICATIONS: Current Outpatient Medications on File Prior to Visit  Medication Sig Dispense Refill  . amLODipine (NORVASC) 10 MG tablet Take 1 tablet (10 mg total) by mouth daily. 30 tablet 0  . Cholecalciferol (VITAMIN D3) 5000 units CAPS Take 1 capsule by mouth daily.    Molly [name] co-enzyme Q-10 30 MG capsule Take 30 mg by mouth 3 (three) times daily.    . diazepam (VALIUM) 5 MG tablet Take 0.5 tablets (2.5 mg total) by mouth every 8 (eight) hours as needed (vertigo/dizziness). 10 tablet 0  . escitalopram (LEXAPRO) 20 MG tablet     . fluticasone (FLONASE) 50 MCG/ACT nasal spray Place 2 sprays into both nostrils daily as needed for allergies or rhinitis. 16 g 5  . furosemide (LASIX) 20 MG tablet Take 20 mg by mouth as needed.     Molly [name] levocetirizine (XYZAL) 5 MG tablet Take 1 tablet (5 mg total) by mouth every evening. 30 tablet 5  . metFORMIN (GLUCOPHAGE) 500 MG tablet Take 2 in the AM and 1 in the PM 90 tablet 0  . Multiple Vitamins-Minerals (MULTIVITAMIN ADULT PO) Take 1 tablet by mouth daily.    . Omega-3 Fatty Acids (FISH OIL PO) Take 1 tablet by mouth daily.    . ondansetron (ZOFRAN ODT) 4 MG disintegrating tablet Take 1 tablet (4 mg total) by mouth every 8 (eight) hours as needed for  nausea or vomiting. 10 tablet 0  . rosuvastatin (CRESTOR) 10 MG tablet Take 1 tablet (10 mg total) by mouth daily. 30 tablet 0   No current facility-administered medications on file prior to visit.     PAST MEDICAL HISTORY: Past Medical History:  Diagnosis Date  . Anxiety   . Constipation   . Depression   . Diabetes type 2, controlled ([place])   . Diarrhea   . Fatigue   . Frequent urination   . GERD (gastroesophageal reflux disease)   . Hyperlipidemia   . Hypertension   . Kidney disease   . Leg cramps   . Multiple food allergies   . Muscle stiffness   . Seasonal allergies   . Shortness of breath   . Sinus pain   . Swallowing difficulty   . Swelling of extremity   . Wheezing     PAST SURGICAL HISTORY: Past Surgical History:  Procedure Laterality Date  . CHOLECYSTECTOMY  1999  . FOOT SURGERY    . MASTECTOMY  SOCIAL HISTORY: Social History   Tobacco Use  . Smoking status: Former Smoker    Types: Cigarettes    Last attempt to quit: 03/23/1998    Years since quitting: 20.5  . Smokeless tobacco: Never Used  Substance Use Topics  . Alcohol use: Yes    Comment: occasional  . Drug use: No    FAMILY HISTORY: Family History  Problem Relation Age of Onset  . Allergic rhinitis Sister   . Eczema Grandchild   . Eczema Daughter   . Urticaria Daughter   . Lupus Other   . Hypertension Mother   . Stroke Mother   . Obesity Mother   . Hypertension Father   . Heart disease Father   . Sudden death Father   . Asthma Neg Hx   . Atopy Neg Hx   . Angioedema Neg Hx     ROS: Review of Systems  Constitutional: Positive for malaise/fatigue. Negative for weight loss.  Cardiovascular: Negative for chest pain.  Gastrointestinal: Negative for nausea and vomiting.  Musculoskeletal:       Negative muscle weakness  Endo/Heme/Allergies:       Negative hypoglycemia    PHYSICAL EXAM: Blood pressure 120/77, pulse 87, temperature 97.9 F (36.6 C), temperature source Oral,  height 5\' 3"  (1.6 m), weight 207 lb (93.9 kg), SpO2 98 %. Body mass index is 36.67 kg/m. Physical Exam Vitals signs reviewed.  Constitutional:      Appearance: Normal appearance. She is obese.  Cardiovascular:     Rate and Rhythm: Normal rate.     Pulses: Normal pulses.  Pulmonary:     Effort: Pulmonary effort is normal.     Breath sounds: Normal breath sounds.  Musculoskeletal: Normal range of motion.  Skin:    General: Skin is warm and dry.  Neurological:     Mental Status: She is alert and oriented to person, place, and time.  Psychiatric:        Mood and Affect: Mood normal.        Behavior: Behavior normal.     RECENT LABS AND TESTS: BMET    Component Value Date/Time   NA 141 09/29/2018 1052   K 3.6 09/29/2018 1052   CL 100 09/29/2018 1052   CO2 25 09/29/2018 1052   GLUCOSE 116 (H) 09/29/2018 1052   GLUCOSE 134 (H) 09/12/2017 2118   BUN 15 09/29/2018 1052   CREATININE 0.95 09/29/2018 1052   CREATININE 0.90 03/23/2016 1116   CALCIUM 10.5 (H) 09/29/2018 1052   GFRNONAA 60 09/29/2018 1052   GFRAA 69 09/29/2018 1052   Lab Results  Component Value Date   HGBA1C 6.1 (H) 09/29/2018   HGBA1C 5.9 (H) 05/05/2018   HGBA1C 6.0 (H) 12/21/2017   HGBA1C 6.5 (H) 08/31/2017   HGBA1C (H) 08/31/2010    6.0 (NOTE)                                                                       According to the ADA Clinical Practice Recommendations for 2011, when HbA1c is used as a screening test:   >=6.5%   Diagnostic of Diabetes Mellitus           (if abnormal result  is confirmed)  5.7-6.4%   Increased  risk of developing Diabetes Mellitus  References:Diagnosis and Classification of Diabetes Mellitus,Diabetes AVWU,9811,91(YNWGN 1):S62-S69 and Standards of Medical Care in         Diabetes - 2011,Diabetes FAOZ,3086,57  (Suppl 1):S11-S61.   Lab Results  Component Value Date   INSULIN 19.0 09/29/2018   INSULIN 17.2 05/05/2018   INSULIN 17.7 12/21/2017   INSULIN 20.4 08/31/2017   CBC     Component Value Date/Time   WBC 4.4 09/12/2017 2118   RBC 4.72 09/12/2017 2118   HGB 15.0 09/12/2017 2118   HGB 14.0 08/31/2017 1140   HCT 43.7 09/12/2017 2118   HCT 41.2 08/31/2017 1140   PLT 173 09/12/2017 2118   MCV 92.6 09/12/2017 2118   MCV 92 08/31/2017 1140   MCH 31.8 09/12/2017 2118   MCHC 34.3 09/12/2017 2118   RDW 13.1 09/12/2017 2118   RDW 13.7 08/31/2017 1140   LYMPHSABS 1.7 08/31/2017 1140   MONOABS 324 03/23/2016 1116   EOSABS 0.1 08/31/2017 1140   BASOSABS 0.0 08/31/2017 1140   Iron/TIBC/Ferritin/ %Sat No results found for: IRON, TIBC, FERRITIN, IRONPCTSAT Lipid Panel     Component Value Date/Time   CHOL 150 08/31/2017 1140   TRIG 105 08/31/2017 1140   HDL 66 08/31/2017 1140   CHOLHDL 2.6 08/31/2010 0540   VLDL 12 08/31/2010 0540   LDLCALC 63 08/31/2017 1140   Hepatic Function Panel     Component Value Date/Time   PROT 7.1 09/29/2018 1052   ALBUMIN 4.8 (H) 09/29/2018 1052   AST 18 09/29/2018 1052   ALT 16 09/29/2018 1052   ALKPHOS 54 09/29/2018 1052   BILITOT 0.3 09/29/2018 1052      Component Value Date/Time   TSH 1.680 08/31/2017 1140   TSH 1.57 03/23/2016 1116      OBESITY BEHAVIORAL INTERVENTION VISIT  Today's visit was # 20   Starting weight: 223 lbs Starting date: 08/31/17 Today's weight : 207 lbs Today's date: 09/29/2018 Total lbs lost to date: 16 At least 15 minutes were spent on discussing the following behavioral intervention visit.   09/29/2018  Height 5\' 3"  (1.6 m)  Weight 207 lb (93.9 kg)  BMI (Calculated) 36.68  BLOOD PRESSURE - SYSTOLIC 846  BLOOD PRESSURE - DIASTOLIC 77   Body Fat % 96.2 %  Total Body Water (lbs) 81.8 lbs     ASK: We discussed the diagnosis of obesity with Kerry Hough today and Angella agreed to give Molly [name] permission to discuss obesity behavioral modification therapy today.  ASSESS: Mollyann has the diagnosis of obesity and her BMI today is 36.68 Ramya is in the action stage of change   ADVISE: Twana was  educated on the multiple health risks of obesity as well as the benefit of weight loss to improve her health. She was advised of the need for long term treatment and the importance of lifestyle modifications to improve her current health and to decrease her risk of future health problems.  AGREE: Multiple dietary modification options and treatment options were discussed and  Nadja agreed to follow the recommendations documented in the above note.  ARRANGE: Maridee was educated on the importance of frequent visits to treat obesity as outlined per CMS and USPSTF guidelines and agreed to schedule her next follow up appointment today.  I, Trixie Dredge, am acting as transcriptionist for Ilene Qua, MD  I have reviewed the above documentation for accuracy and completeness, and I agree with the above. - Ilene Qua, MD

## 2018-10-05 ENCOUNTER — Other Ambulatory Visit (INDEPENDENT_AMBULATORY_CARE_PROVIDER_SITE_OTHER): Payer: Self-pay | Admitting: Family Medicine

## 2018-10-05 DIAGNOSIS — I10 Essential (primary) hypertension: Secondary | ICD-10-CM

## 2018-10-06 MED ORDER — IRBESARTAN 150 MG PO TABS: 150.0000 mg | ORAL_TABLET | Freq: Every day | ORAL | 0 refills | Status: DC

## 2018-10-07 LAB — SPECIMEN STATUS REPORT

## 2018-10-07 LAB — VITAMIN D 25 HYDROXY (VIT D DEFICIENCY, FRACTURES): VIT D 25 HYDROXY: 35.5 ng/mL (ref 30.0–100.0)

## 2018-10-20 ENCOUNTER — Encounter (INDEPENDENT_AMBULATORY_CARE_PROVIDER_SITE_OTHER): Payer: Self-pay

## 2018-10-20 ENCOUNTER — Ambulatory Visit (INDEPENDENT_AMBULATORY_CARE_PROVIDER_SITE_OTHER): Payer: Medicare HMO | Admitting: Family Medicine

## 2018-10-28 ENCOUNTER — Other Ambulatory Visit (INDEPENDENT_AMBULATORY_CARE_PROVIDER_SITE_OTHER): Payer: Self-pay | Admitting: Family Medicine

## 2018-10-28 DIAGNOSIS — I10 Essential (primary) hypertension: Secondary | ICD-10-CM

## 2018-10-31 ENCOUNTER — Encounter (INDEPENDENT_AMBULATORY_CARE_PROVIDER_SITE_OTHER): Payer: Self-pay

## 2018-11-01 ENCOUNTER — Other Ambulatory Visit (INDEPENDENT_AMBULATORY_CARE_PROVIDER_SITE_OTHER): Payer: Self-pay | Admitting: Family Medicine

## 2018-11-01 DIAGNOSIS — I10 Essential (primary) hypertension: Secondary | ICD-10-CM

## 2018-11-03 ENCOUNTER — Encounter (INDEPENDENT_AMBULATORY_CARE_PROVIDER_SITE_OTHER): Payer: Self-pay | Admitting: Physician Assistant

## 2018-11-03 ENCOUNTER — Ambulatory Visit (INDEPENDENT_AMBULATORY_CARE_PROVIDER_SITE_OTHER): Payer: Medicare HMO | Admitting: Physician Assistant

## 2018-11-03 ENCOUNTER — Other Ambulatory Visit: Payer: Self-pay

## 2018-11-03 DIAGNOSIS — Z6836 Body mass index (BMI) 36.0-36.9, adult: Secondary | ICD-10-CM | POA: Diagnosis not present

## 2018-11-03 DIAGNOSIS — I10 Essential (primary) hypertension: Secondary | ICD-10-CM | POA: Diagnosis not present

## 2018-11-03 DIAGNOSIS — E119 Type 2 diabetes mellitus without complications: Secondary | ICD-10-CM

## 2018-11-03 MED ORDER — CHLORTHALIDONE 25 MG PO TABS: 25.0000 mg | ORAL_TABLET | Freq: Every day | ORAL | 0 refills | Status: DC

## 2018-11-03 MED ORDER — IRBESARTAN 150 MG PO TABS: 150.0000 mg | ORAL_TABLET | Freq: Every day | ORAL | 0 refills | Status: DC

## 2018-11-07 NOTE — Progress Notes (Signed)
Office: [phone]  /  Fax: [phone] TeleHealth Visit:  Molly [name] has verbally consented to this TeleHealth visit today. The patient is located at home, the provider is located at the [facility] office. The participants in this visit include the listed provider and patient. The visit was conducted today via telephone call.  HPI:   Chief Complaint: OBESITY Molly [name] is here to discuss her progress with her obesity treatment plan. She is on the Category 2 plan and is following her eating plan approximately 85% of the time. She states she is walking/hand weights 20 minutes 7 times per week. Molly [name] reports that according to her scale she has lost 4-5 pounds. She is enjoying the plan and getting enough protein. Her goal is to weigh less than 200 pounds by her birthday.  We were unable to weigh the patient today for this TeleHealth visit. She feels as if she has lost 4-5 lbs since her last visit. She has lost 16 lbs since starting treatment with [name].  Hypertension Molly [name] is a 73 y.o. female with hypertension and is on irbesartan and chlorthalidone.  Molly [name] denies chest pain or shortness of breath on exertion. She is working weight loss to help control her blood pressure with the goal of decreasing her risk of heart attack and stroke. Molly [name]'s blood pressure average is 120's/70's.   Diabetes I without long-term use of Insulin Molly [name] has a diagnosis of diabetes type I and is on metformin. Molly [name] states fasting blood sugars are in the range of 80 and 110. She denies any hypoglycemic episodes. Last A1c was 6.1 on 09/29/2018. She has been working on intensive lifestyle modifications including diet, exercise, and weight loss to help control her blood glucose levels. She denies nausea, vomiting, or diarrhea.  ASSESSMENT AND PLAN:  Essential hypertension - Plan: chlorthalidone (HYGROTON) 25 MG tablet, irbesartan (AVAPRO) 150 MG tablet  Type 2 diabetes mellitus without complication,  without long-term current use of insulin (HCC)  Class 2 severe obesity with serious comorbidity and body mass index (BMI) of 36.0 to 36.9 in adult, unspecified obesity type ([place])  PLAN:  Hypertension We discussed sodium restriction, working on healthy weight loss, and a regular exercise program as the means to achieve improved blood pressure control. Molly [name] agreed with this plan and agreed to follow up as directed. We will continue to monitor her blood pressure as well as her progress with the above lifestyle modifications. Molly [name] was given a refill on her irbesartan #30 with 0 refills and a refill on her chlorthalidone #30 with 0 refills. She agrees to follow-up with our clinic in 3 weeks. She will watch for signs of hypotension as she continues her lifestyle modifications.  Diabetes I without long-term use of Insulin Molly [name] has been given extensive diabetes education by myself today including ideal fasting and post-prandial blood glucose readings, individual ideal Hgb A1c goals and hypoglycemia prevention. We discussed the importance of good blood sugar control to decrease the likelihood of diabetic complications such as nephropathy, neuropathy, limb loss, blindness, coronary artery disease, and death. We discussed the importance of intensive lifestyle modification including diet, exercise and weight loss as the first line treatment for diabetes. Molly [name] agrees to continue her diabetes medications and will follow-up at the agreed upon time.  Obesity Molly [name] is currently in the action stage of change. As such, her goal is to continue with weight loss efforts. She has agreed to follow the Category 2 plan. Molly [name] has been instructed to work up  to a goal of 150 minutes of combined cardio and strengthening exercise per week for weight loss and overall health benefits. We discussed the following Behavioral Modification Strategies today: work on meal planning, easy cooking plans, and keeping healthy foods in the home.   Molly [name] has agreed to follow-up with our clinic in 3 weeks. She was informed of the importance of frequent follow up visits to maximize her success with intensive lifestyle modifications for her multiple health conditions.  ALLERGIES: No Known Allergies  MEDICATIONS: Current Outpatient Medications on File Prior to Visit  Medication Sig Dispense Refill  . amLODipine (NORVASC) 10 MG tablet Take 1 tablet (10 mg total) by mouth daily. 30 tablet 0  . Cholecalciferol (VITAMIN D3) 5000 units CAPS Take 1 capsule by mouth daily.    [name] co-enzyme Q-10 30 MG capsule Take 30 mg by mouth 3 (three) times daily.    . diazepam (VALIUM) 5 MG tablet Take 0.5 tablets (2.5 mg total) by mouth every 8 (eight) hours as needed (vertigo/dizziness). 10 tablet 0  . escitalopram (LEXAPRO) 20 MG tablet     . fluticasone (FLONASE) 50 MCG/ACT nasal spray Place 2 sprays into both nostrils daily as needed for allergies or rhinitis. 16 g 5  . furosemide (LASIX) 20 MG tablet Take 20 mg by mouth as needed.     [name] levocetirizine (XYZAL) 5 MG tablet Take 1 tablet (5 mg total) by mouth every evening. 30 tablet 5  . losartan (COZAAR) 100 MG tablet Take 1 tablet (100 mg total) by mouth daily. 30 tablet 0  . metFORMIN (GLUCOPHAGE) 500 MG tablet Take 2 in the AM and 1 in the PM 90 tablet 0  . Multiple Vitamins-Minerals (MULTIVITAMIN ADULT PO) Take 1 tablet by mouth daily.    . Omega-3 Fatty Acids (FISH OIL PO) Take 1 tablet by mouth daily.    . ondansetron (ZOFRAN ODT) 4 MG disintegrating tablet Take 1 tablet (4 mg total) by mouth every 8 (eight) hours as needed for nausea or vomiting. 10 tablet 0  . rosuvastatin (CRESTOR) 10 MG tablet Take 1 tablet (10 mg total) by mouth daily. 30 tablet 0   No current facility-administered medications on file prior to visit.     PAST MEDICAL HISTORY: Past Medical History:  Diagnosis Date  . Anxiety   . Constipation   . Depression   . Diabetes type 2, controlled ([place])   . Diarrhea   . Fatigue    . Frequent urination   . GERD (gastroesophageal reflux disease)   . Hyperlipidemia   . Hypertension   . Kidney disease   . Leg cramps   . Multiple food allergies   . Muscle stiffness   . Seasonal allergies   . Shortness of breath   . Sinus pain   . Swallowing difficulty   . Swelling of extremity   . Wheezing     PAST SURGICAL HISTORY: Past Surgical History:  Procedure Laterality Date  . CHOLECYSTECTOMY  1999  . FOOT SURGERY    . MASTECTOMY      SOCIAL HISTORY: Social History   Tobacco Use  . Smoking status: Former Smoker    Types: Cigarettes    Last attempt to quit: 03/23/1998    Years since quitting: 20.6  . Smokeless tobacco: Never Used  Substance Use Topics  . Alcohol use: Yes    Comment: occasional  . Drug use: No    FAMILY HISTORY: Family History  Problem Relation Age of Onset  . Allergic  rhinitis Sister   . Eczema Grandchild   . Eczema Daughter   . Urticaria Daughter   . Lupus Other   . Hypertension Mother   . Stroke Mother   . Obesity Mother   . Hypertension Father   . Heart disease Father   . Sudden death Father   . Asthma Neg Hx   . Atopy Neg Hx   . Angioedema Neg Hx    ROS: Review of Systems  Gastrointestinal: Negative for diarrhea, nausea and vomiting.  Endo/Heme/Allergies:       Negative for hypoglycemia.   PHYSICAL EXAM: Pt in no acute distress  RECENT LABS AND TESTS: BMET    Component Value Date/Time   NA 141 09/29/2018 1052   K 3.6 09/29/2018 1052   CL 100 09/29/2018 1052   CO2 25 09/29/2018 1052   GLUCOSE 116 (H) 09/29/2018 1052   GLUCOSE 134 (H) 09/12/2017 2118   BUN 15 09/29/2018 1052   CREATININE 0.95 09/29/2018 1052   CREATININE 0.90 03/23/2016 1116   CALCIUM 10.5 (H) 09/29/2018 1052   GFRNONAA 60 09/29/2018 1052   GFRAA 69 09/29/2018 1052   Lab Results  Component Value Date   HGBA1C 6.1 (H) 09/29/2018   HGBA1C 5.9 (H) 05/05/2018   HGBA1C 6.0 (H) 12/21/2017   HGBA1C 6.5 (H) 08/31/2017   HGBA1C (H) 08/31/2010     6.0 (NOTE)                                                                       According to the ADA Clinical Practice Recommendations for 2011, when HbA1c is used as a screening test:   >=6.5%   Diagnostic of Diabetes Mellitus           (if abnormal result  is confirmed)  5.7-6.4%   Increased risk of developing Diabetes Mellitus  References:Diagnosis and Classification of Diabetes Mellitus,Diabetes RSWN,4627,03(JKKXF 1):S62-S69 and Standards of Medical Care in         Diabetes - 2011,Diabetes Care,2011,34  (Suppl 1):S11-S61.   Lab Results  Component Value Date   INSULIN 19.0 09/29/2018   INSULIN 17.2 05/05/2018   INSULIN 17.7 12/21/2017   INSULIN 20.4 08/31/2017   CBC    Component Value Date/Time   WBC 4.4 09/12/2017 2118   RBC 4.72 09/12/2017 2118   HGB 15.0 09/12/2017 2118   HGB 14.0 08/31/2017 1140   HCT 43.7 09/12/2017 2118   HCT 41.2 08/31/2017 1140   PLT 173 09/12/2017 2118   MCV 92.6 09/12/2017 2118   MCV 92 08/31/2017 1140   MCH 31.8 09/12/2017 2118   MCHC 34.3 09/12/2017 2118   RDW 13.1 09/12/2017 2118   RDW 13.7 08/31/2017 1140   LYMPHSABS 1.7 08/31/2017 1140   MONOABS 324 03/23/2016 1116   EOSABS 0.1 08/31/2017 1140   BASOSABS 0.0 08/31/2017 1140   Iron/TIBC/Ferritin/ %Sat No results found for: IRON, TIBC, FERRITIN, IRONPCTSAT Lipid Panel     Component Value Date/Time   CHOL 150 08/31/2017 1140   TRIG 105 08/31/2017 1140   HDL 66 08/31/2017 1140   CHOLHDL 2.6 08/31/2010 0540   VLDL 12 08/31/2010 0540   LDLCALC 63 08/31/2017 1140   Hepatic Function Panel     Component Value Date/Time   PROT  7.1 09/29/2018 1052   ALBUMIN 4.8 (H) 09/29/2018 1052   AST 18 09/29/2018 1052   ALT 16 09/29/2018 1052   ALKPHOS 54 09/29/2018 1052   BILITOT 0.3 09/29/2018 1052      Component Value Date/Time   TSH 1.680 08/31/2017 1140   TSH 1.57 03/23/2016 1116   Results for TIMMIA, COGBURN (MRN [id number]) as of 11/07/2018 09:36  Ref. Range 09/29/2018 10:52  Vitamin D,  25-Hydroxy Latest Ref Range: 30.0 - 100.0 ng/mL 35.5    I, Michaelene Song, am acting as Location manager for [employer], PA-C I, Abby Potash, PA-C have reviewed above note and agree with its content

## 2018-11-21 ENCOUNTER — Ambulatory Visit (INDEPENDENT_AMBULATORY_CARE_PROVIDER_SITE_OTHER): Payer: Medicare HMO | Admitting: Physician Assistant

## 2018-11-21 ENCOUNTER — Encounter (INDEPENDENT_AMBULATORY_CARE_PROVIDER_SITE_OTHER): Payer: Self-pay | Admitting: Physician Assistant

## 2018-11-21 ENCOUNTER — Other Ambulatory Visit: Payer: Self-pay

## 2018-11-21 DIAGNOSIS — Z6836 Body mass index (BMI) 36.0-36.9, adult: Secondary | ICD-10-CM | POA: Diagnosis not present

## 2018-11-21 DIAGNOSIS — E7849 Other hyperlipidemia: Secondary | ICD-10-CM | POA: Diagnosis not present

## 2018-11-22 NOTE — Progress Notes (Signed)
Office: [phone]  /  Fax: [phone] TeleHealth Visit:  Molly [name] has verbally consented to this TeleHealth visit today. The patient is located at home, the provider is located at the [facility] office. The participants in this visit include the listed provider and patient and any and all parties involved. The visit was conducted today via telephone. Zaineb was unable to use realtime audiovisual technology today and the telehealth visit was conducted via telephone.  HPI:   Chief Complaint: OBESITY Molly [name] is here to discuss her progress with her obesity treatment plan. She is on the Category 2 plan and is following her eating plan approximately 85 % of the time. She states she is riding the stationary bike for 10 to 15 minutes 3 to 4 times per week. Molly [name] reports that her weight today was at 202 pounds. She feels good about the plan and she is getting all of the food in. We were unable to weigh the patient today for this TeleHealth visit. She feels as if she has lost weight since her last visit. She has lost 21 lbs since starting treatment with [name].  Hyperlipidemia Molly [name] has hyperlipidemia and she is on Rosuvastatin. She has been trying to improve her cholesterol levels with intensive lifestyle modification including a low saturated fat diet, exercise and weight loss. She denies any chest pain.  ASSESSMENT AND PLAN:  Other hyperlipidemia  Class 2 severe obesity with serious comorbidity and body mass index (BMI) of 36.0 to 36.9 in adult, unspecified obesity type ([place])  PLAN:  Hyperlipidemia Molly [name] was informed of the American Heart Association Guidelines emphasizing intensive lifestyle modifications as the first line treatment for hyperlipidemia. We discussed many lifestyle modifications today in depth, and Molly [name] will continue to work on decreasing saturated fats such as fatty red meat, butter and many fried foods. She will also increase vegetables and lean protein in her diet  and continue to work on exercise and weight loss efforts. Molly [name] will continue with her medications as prescribed and follow up as directed.  Obesity Molly [name] is currently in the action stage of change. As such, her goal is to continue with weight loss efforts She has agreed to follow the Category 2 plan Molly [name] has been instructed to work up to a goal of 150 minutes of combined cardio and strengthening exercise per week for weight loss and overall health benefits. We discussed the following Behavioral Modification Strategies today: keeping healthy foods in the home and work on meal planning and easy cooking plans  Molly [name] has agreed to follow up with our clinic in 2 weeks. She was informed of the importance of frequent follow up visits to maximize her success with intensive lifestyle modifications for her multiple health conditions.  ALLERGIES: No Known Allergies  MEDICATIONS: Current Outpatient Medications on File Prior to Visit  Medication Sig Dispense Refill  . amLODipine (NORVASC) 10 MG tablet Take 1 tablet (10 mg total) by mouth daily. 30 tablet 0  . chlorthalidone (HYGROTON) 25 MG tablet Take 1 tablet (25 mg total) by mouth daily. 30 tablet 0  . Cholecalciferol (VITAMIN D3) 5000 units CAPS Take 1 capsule by mouth daily.    [name] co-enzyme Q-10 30 MG capsule Take 30 mg by mouth 3 (three) times daily.    . diazepam (VALIUM) 5 MG tablet Take 0.5 tablets (2.5 mg total) by mouth every 8 (eight) hours as needed (vertigo/dizziness). 10 tablet 0  . escitalopram (LEXAPRO) 20 MG tablet     . fluticasone (FLONASE)  50 MCG/ACT nasal spray Place 2 sprays into both nostrils daily as needed for allergies or rhinitis. 16 g 5  . furosemide (LASIX) 20 MG tablet Take 20 mg by mouth as needed.     . irbesartan (AVAPRO) 150 MG tablet Take 1 tablet (150 mg total) by mouth daily. 30 tablet 0  . levocetirizine (XYZAL) 5 MG tablet Take 1 tablet (5 mg total) by mouth every evening. 30 tablet 5  . losartan (COZAAR) 100 MG  tablet Take 1 tablet (100 mg total) by mouth daily. 30 tablet 0  . metFORMIN (GLUCOPHAGE) 500 MG tablet Take 2 in the AM and 1 in the PM 90 tablet 0  . Multiple Vitamins-Minerals (MULTIVITAMIN ADULT PO) Take 1 tablet by mouth daily.    . Omega-3 Fatty Acids (FISH OIL PO) Take 1 tablet by mouth daily.    . ondansetron (ZOFRAN ODT) 4 MG disintegrating tablet Take 1 tablet (4 mg total) by mouth every 8 (eight) hours as needed for nausea or vomiting. 10 tablet 0  . rosuvastatin (CRESTOR) 10 MG tablet Take 1 tablet (10 mg total) by mouth daily. 30 tablet 0   No current facility-administered medications on file prior to visit.     PAST MEDICAL HISTORY: Past Medical History:  Diagnosis Date  . Anxiety   . Constipation   . Depression   . Diabetes type 2, controlled ([place])   . Diarrhea   . Fatigue   . Frequent urination   . GERD (gastroesophageal reflux disease)   . Hyperlipidemia   . Hypertension   . Kidney disease   . Leg cramps   . Multiple food allergies   . Muscle stiffness   . Seasonal allergies   . Shortness of breath   . Sinus pain   . Swallowing difficulty   . Swelling of extremity   . Wheezing     PAST SURGICAL HISTORY: Past Surgical History:  Procedure Laterality Date  . CHOLECYSTECTOMY  1999  . FOOT SURGERY    . MASTECTOMY      SOCIAL HISTORY: Social History   Tobacco Use  . Smoking status: Former Smoker    Types: Cigarettes    Last attempt to quit: 03/23/1998    Years since quitting: 20.6  . Smokeless tobacco: Never Used  Substance Use Topics  . Alcohol use: Yes    Comment: occasional  . Drug use: No    FAMILY HISTORY: Family History  Problem Relation Age of Onset  . Allergic rhinitis Sister   . Eczema Grandchild   . Eczema Daughter   . Urticaria Daughter   . Lupus Other   . Hypertension Mother   . Stroke Mother   . Obesity Mother   . Hypertension Father   . Heart disease Father   . Sudden death Father   . Asthma Neg Hx   . Atopy Neg Hx   .  Angioedema Neg Hx     ROS: Review of Systems  Constitutional: Positive for weight loss.  Cardiovascular: Negative for chest pain.    PHYSICAL EXAM: Pt in no acute distress  RECENT LABS AND TESTS: BMET    Component Value Date/Time   NA 141 09/29/2018 1052   K 3.6 09/29/2018 1052   CL 100 09/29/2018 1052   CO2 25 09/29/2018 1052   GLUCOSE 116 (H) 09/29/2018 1052   GLUCOSE 134 (H) 09/12/2017 2118   BUN 15 09/29/2018 1052   CREATININE 0.95 09/29/2018 1052   CREATININE 0.90 03/23/2016 1116  CALCIUM 10.5 (H) 09/29/2018 1052   GFRNONAA 60 09/29/2018 1052   GFRAA 69 09/29/2018 1052   Lab Results  Component Value Date   HGBA1C 6.1 (H) 09/29/2018   HGBA1C 5.9 (H) 05/05/2018   HGBA1C 6.0 (H) 12/21/2017   HGBA1C 6.5 (H) 08/31/2017   HGBA1C (H) 08/31/2010    6.0 (NOTE)                                                                       According to the ADA Clinical Practice Recommendations for 2011, when HbA1c is used as a screening test:   >=6.5%   Diagnostic of Diabetes Mellitus           (if abnormal result  is confirmed)  5.7-6.4%   Increased risk of developing Diabetes Mellitus  References:Diagnosis and Classification of Diabetes Mellitus,Diabetes NGEX,5284,13(KGMWN 1):S62-S69 and Standards of Medical Care in         Diabetes - 2011,Diabetes Care,2011,34  (Suppl 1):S11-S61.   Lab Results  Component Value Date   INSULIN 19.0 09/29/2018   INSULIN 17.2 05/05/2018   INSULIN 17.7 12/21/2017   INSULIN 20.4 08/31/2017   CBC    Component Value Date/Time   WBC 4.4 09/12/2017 2118   RBC 4.72 09/12/2017 2118   HGB 15.0 09/12/2017 2118   HGB 14.0 08/31/2017 1140   HCT 43.7 09/12/2017 2118   HCT 41.2 08/31/2017 1140   PLT 173 09/12/2017 2118   MCV 92.6 09/12/2017 2118   MCV 92 08/31/2017 1140   MCH 31.8 09/12/2017 2118   MCHC 34.3 09/12/2017 2118   RDW 13.1 09/12/2017 2118   RDW 13.7 08/31/2017 1140   LYMPHSABS 1.7 08/31/2017 1140   MONOABS 324 03/23/2016 1116   EOSABS  0.1 08/31/2017 1140   BASOSABS 0.0 08/31/2017 1140   Iron/TIBC/Ferritin/ %Sat No results found for: IRON, TIBC, FERRITIN, IRONPCTSAT Lipid Panel     Component Value Date/Time   CHOL 150 08/31/2017 1140   TRIG 105 08/31/2017 1140   HDL 66 08/31/2017 1140   CHOLHDL 2.6 08/31/2010 0540   VLDL 12 08/31/2010 0540   LDLCALC 63 08/31/2017 1140   Hepatic Function Panel     Component Value Date/Time   PROT 7.1 09/29/2018 1052   ALBUMIN 4.8 (H) 09/29/2018 1052   AST 18 09/29/2018 1052   ALT 16 09/29/2018 1052   ALKPHOS 54 09/29/2018 1052   BILITOT 0.3 09/29/2018 1052      Component Value Date/Time   TSH 1.680 08/31/2017 1140   TSH 1.57 03/23/2016 1116     Ref. Range 09/29/2018 10:52  Vitamin D, 25-Hydroxy Latest Ref Range: 30.0 - 100.0 ng/mL 35.5    I, Doreene Nest, am acting as Location manager for Abby Potash, PA-C I, Abby Potash, PA-C have reviewed above note and agree with its content

## 2018-11-26 ENCOUNTER — Other Ambulatory Visit (INDEPENDENT_AMBULATORY_CARE_PROVIDER_SITE_OTHER): Payer: Self-pay | Admitting: Physician Assistant

## 2018-11-26 DIAGNOSIS — I10 Essential (primary) hypertension: Secondary | ICD-10-CM

## 2018-12-07 ENCOUNTER — Other Ambulatory Visit: Payer: Self-pay

## 2018-12-07 ENCOUNTER — Ambulatory Visit (INDEPENDENT_AMBULATORY_CARE_PROVIDER_SITE_OTHER): Payer: Medicare HMO | Admitting: Physician Assistant

## 2018-12-07 DIAGNOSIS — I10 Essential (primary) hypertension: Secondary | ICD-10-CM | POA: Diagnosis not present

## 2018-12-07 DIAGNOSIS — E559 Vitamin D deficiency, unspecified: Secondary | ICD-10-CM | POA: Diagnosis not present

## 2018-12-07 DIAGNOSIS — Z6836 Body mass index (BMI) 36.0-36.9, adult: Secondary | ICD-10-CM | POA: Diagnosis not present

## 2018-12-07 MED ORDER — CHLORTHALIDONE 25 MG PO TABS: 25.0000 mg | ORAL_TABLET | Freq: Every day | ORAL | 1 refills | Status: DC

## 2018-12-07 MED ORDER — LOSARTAN POTASSIUM 100 MG PO TABS: 100.0000 mg | ORAL_TABLET | Freq: Every day | ORAL | 1 refills | Status: DC

## 2018-12-07 NOTE — Progress Notes (Signed)
Office: [phone]  /  Fax: [phone] TeleHealth Visit:  Molly [name] has verbally consented to this TeleHealth visit today. The patient is located at home, the provider is located at the [facility] office. The participants in this visit include the listed provider and patient. The visit was conducted today via telephone call (cell phone).  HPI:   Chief Complaint: OBESITY Molly [name] is here to discuss her progress with her obesity treatment plan. She is on the Category 2 plan and is following her eating plan approximately 80% of the time. She states she is using exercise bands 10-15 minutes 3-4 times per week. Molly [name] reports that her weight today was 200 lbs. She states that she is not struggling with the plan at all and doing well.  We were unable to weigh the patient today for this TeleHealth visit. She states her weight is 200 lbs today. She has lost 16 lbs since starting treatment with [name].  Hypertension Molly [name] is a 73 y.o. female with hypertension and is on losartan and chlorthalidone. Molly [name] denies chest pain or headache. She is working weight loss to help control her blood pressure with the goal of decreasing her risk of heart attack and stroke. Molly [name] stopped amlodipine due to her blood pressure being low. She reports her most recent blood pressure was 116/80 with the highest blood pressure recorded in the afternoon being 130/80.  Vitamin D deficiency Molly [name] has a diagnosis of Vitamin D deficiency. She is currently taking Vit D and denies nausea, vomiting or muscle weakness.  ASSESSMENT AND PLAN:  Essential hypertension - Plan: chlorthalidone (HYGROTON) 25 MG tablet, losartan (COZAAR) 100 MG tablet  Vitamin D deficiency  Class 2 severe obesity with serious comorbidity and body mass index (BMI) of 36.0 to 36.9 in adult, unspecified obesity type ([place])  PLAN:  Hypertension We discussed sodium restriction, working on healthy weight loss, and a regular exercise  program as the means to achieve improved blood pressure control. Molly [name] agreed with this plan and agreed to follow up as directed. We will continue to monitor her blood pressure as well as her progress with the above lifestyle modifications.  Molly [name] was given a refill on her losartan #30 with 1 refill and on her chlorthalidone #30 with 1 refill. She agrees to follow-up with our clinic in 2 weeks. She will watch for signs of hypotension as she continues her lifestyle modifications.  Vitamin D Deficiency Molly [name] was informed that low Vitamin D levels contributes to fatigue and are associated with obesity, breast, and colon cancer. She agrees to continue to take Vit D and will follow-up for routine testing of Vitamin D, at least 2-3 times per year. She was informed of the risk of over-replacement of Vitamin D and agrees to not increase her dose unless she discusses this with [name] first. Molly [name] agrees to follow-up with our clinic in 2 weeks.  Obesity Molly [name] is currently in the action stage of change. As such, her goal is to continue with weight loss efforts. She has agreed to follow the Category 2 plan. Molly [name] has been instructed to work up to a goal of 150 minutes of combined cardio and strengthening exercise per week for weight loss and overall health benefits. We discussed the following Behavioral Modification Strategies today: work on meal planning, easy cooking plans, and keeping healthy foods in the home.  Molly [name] has agreed to follow-up with our clinic in 2 weeks. She was informed of the importance of frequent follow-up visits to  maximize her success with intensive lifestyle modifications for her multiple health conditions.  ALLERGIES: No Known Allergies  MEDICATIONS: Current Outpatient Medications on File Prior to Visit  Medication Sig Dispense Refill  . amLODipine (NORVASC) 10 MG tablet Take 1 tablet (10 mg total) by mouth daily. 30 tablet 0  . Cholecalciferol (VITAMIN D3) 5000 units CAPS Take 1 capsule by  mouth daily.    [name] co-enzyme Q-10 30 MG capsule Take 30 mg by mouth 3 (three) times daily.    . diazepam (VALIUM) 5 MG tablet Take 0.5 tablets (2.5 mg total) by mouth every 8 (eight) hours as needed (vertigo/dizziness). 10 tablet 0  . escitalopram (LEXAPRO) 20 MG tablet     . fluticasone (FLONASE) 50 MCG/ACT nasal spray Place 2 sprays into both nostrils daily as needed for allergies or rhinitis. 16 g 5  . furosemide (LASIX) 20 MG tablet Take 20 mg by mouth as needed.     . irbesartan (AVAPRO) 150 MG tablet Take 1 tablet (150 mg total) by mouth daily. 30 tablet 0  . levocetirizine (XYZAL) 5 MG tablet Take 1 tablet (5 mg total) by mouth every evening. 30 tablet 5  . metFORMIN (GLUCOPHAGE) 500 MG tablet Take 2 in the AM and 1 in the PM 90 tablet 0  . Multiple Vitamins-Minerals (MULTIVITAMIN ADULT PO) Take 1 tablet by mouth daily.    . Omega-3 Fatty Acids (FISH OIL PO) Take 1 tablet by mouth daily.    . ondansetron (ZOFRAN ODT) 4 MG disintegrating tablet Take 1 tablet (4 mg total) by mouth every 8 (eight) hours as needed for nausea or vomiting. 10 tablet 0  . rosuvastatin (CRESTOR) 10 MG tablet Take 1 tablet (10 mg total) by mouth daily. 30 tablet 0   No current facility-administered medications on file prior to visit.     PAST MEDICAL HISTORY: Past Medical History:  Diagnosis Date  . Anxiety   . Constipation   . Depression   . Diabetes type 2, controlled ([place])   . Diarrhea   . Fatigue   . Frequent urination   . GERD (gastroesophageal reflux disease)   . Hyperlipidemia   . Hypertension   . Kidney disease   . Leg cramps   . Multiple food allergies   . Muscle stiffness   . Seasonal allergies   . Shortness of breath   . Sinus pain   . Swallowing difficulty   . Swelling of extremity   . Wheezing     PAST SURGICAL HISTORY: Past Surgical History:  Procedure Laterality Date  . CHOLECYSTECTOMY  1999  . FOOT SURGERY    . MASTECTOMY      SOCIAL HISTORY: Social History   Tobacco  Use  . Smoking status: Former Smoker    Types: Cigarettes    Last attempt to quit: 03/23/1998    Years since quitting: 20.7  . Smokeless tobacco: Never Used  Substance Use Topics  . Alcohol use: Yes    Comment: occasional  . Drug use: No    FAMILY HISTORY: Family History  Problem Relation Age of Onset  . Allergic rhinitis Sister   . Eczema Grandchild   . Eczema Daughter   . Urticaria Daughter   . Lupus Other   . Hypertension Mother   . Stroke Mother   . Obesity Mother   . Hypertension Father   . Heart disease Father   . Sudden death Father   . Asthma Neg Hx   . Atopy Neg Hx   .  Angioedema Neg Hx    ROS: Review of Systems  Cardiovascular: Negative for chest pain.  Gastrointestinal: Negative for nausea and vomiting.  Musculoskeletal:       Negative for muscle weakness.  Neurological: Negative for headaches.   PHYSICAL EXAM: Pt in no acute distress  RECENT LABS AND TESTS: BMET    Component Value Date/Time   NA 141 09/29/2018 1052   K 3.6 09/29/2018 1052   CL 100 09/29/2018 1052   CO2 25 09/29/2018 1052   GLUCOSE 116 (H) 09/29/2018 1052   GLUCOSE 134 (H) 09/12/2017 2118   BUN 15 09/29/2018 1052   CREATININE 0.95 09/29/2018 1052   CREATININE 0.90 03/23/2016 1116   CALCIUM 10.5 (H) 09/29/2018 1052   GFRNONAA 60 09/29/2018 1052   GFRAA 69 09/29/2018 1052   Lab Results  Component Value Date   HGBA1C 6.1 (H) 09/29/2018   HGBA1C 5.9 (H) 05/05/2018   HGBA1C 6.0 (H) 12/21/2017   HGBA1C 6.5 (H) 08/31/2017   HGBA1C (H) 08/31/2010    6.0 (NOTE)                                                                       According to the ADA Clinical Practice Recommendations for 2011, when HbA1c is used as a screening test:   >=6.5%   Diagnostic of Diabetes Mellitus           (if abnormal result  is confirmed)  5.7-6.4%   Increased risk of developing Diabetes Mellitus  References:Diagnosis and Classification of Diabetes Mellitus,Diabetes TMLY,6503,54(SFKCL 1):S62-S69 and  Standards of Medical Care in         Diabetes - 2011,Diabetes Care,2011,34  (Suppl 1):S11-S61.   Lab Results  Component Value Date   INSULIN 19.0 09/29/2018   INSULIN 17.2 05/05/2018   INSULIN 17.7 12/21/2017   INSULIN 20.4 08/31/2017   CBC    Component Value Date/Time   WBC 4.4 09/12/2017 2118   RBC 4.72 09/12/2017 2118   HGB 15.0 09/12/2017 2118   HGB 14.0 08/31/2017 1140   HCT 43.7 09/12/2017 2118   HCT 41.2 08/31/2017 1140   PLT 173 09/12/2017 2118   MCV 92.6 09/12/2017 2118   MCV 92 08/31/2017 1140   MCH 31.8 09/12/2017 2118   MCHC 34.3 09/12/2017 2118   RDW 13.1 09/12/2017 2118   RDW 13.7 08/31/2017 1140   LYMPHSABS 1.7 08/31/2017 1140   MONOABS 324 03/23/2016 1116   EOSABS 0.1 08/31/2017 1140   BASOSABS 0.0 08/31/2017 1140   Iron/TIBC/Ferritin/ %Sat No results found for: IRON, TIBC, FERRITIN, IRONPCTSAT Lipid Panel     Component Value Date/Time   CHOL 150 08/31/2017 1140   TRIG 105 08/31/2017 1140   HDL 66 08/31/2017 1140   CHOLHDL 2.6 08/31/2010 0540   VLDL 12 08/31/2010 0540   LDLCALC 63 08/31/2017 1140   Hepatic Function Panel     Component Value Date/Time   PROT 7.1 09/29/2018 1052   ALBUMIN 4.8 (H) 09/29/2018 1052   AST 18 09/29/2018 1052   ALT 16 09/29/2018 1052   ALKPHOS 54 09/29/2018 1052   BILITOT 0.3 09/29/2018 1052      Component Value Date/Time   TSH 1.680 08/31/2017 1140   TSH 1.57 03/23/2016 1116   Results for  TEIANA, HAJDUK (MRN [id number]) as of 12/07/2018 14:32  Ref. Range 09/29/2018 10:52  Vitamin D, 25-Hydroxy Latest Ref Range: 30.0 - 100.0 ng/mL 35.5    I, Michaelene Song, am acting as Location manager for [employer], PA-C I, Abby Potash, PA-C have reviewed above note and agree with its content

## 2018-12-22 ENCOUNTER — Other Ambulatory Visit: Payer: Self-pay

## 2018-12-22 ENCOUNTER — Ambulatory Visit (INDEPENDENT_AMBULATORY_CARE_PROVIDER_SITE_OTHER): Payer: Medicare HMO | Admitting: Physician Assistant

## 2018-12-22 ENCOUNTER — Encounter (INDEPENDENT_AMBULATORY_CARE_PROVIDER_SITE_OTHER): Payer: Self-pay | Admitting: Physician Assistant

## 2018-12-22 DIAGNOSIS — E119 Type 2 diabetes mellitus without complications: Secondary | ICD-10-CM

## 2018-12-22 DIAGNOSIS — Z6836 Body mass index (BMI) 36.0-36.9, adult: Secondary | ICD-10-CM

## 2018-12-22 NOTE — Progress Notes (Signed)
Office: [phone]  /  Fax: [phone] TeleHealth Visit:  Molly [name] has verbally consented to this TeleHealth visit today. The patient is located at home, the provider is located at the [employer] [facility] office. The participants in this visit include the listed provider and patient. The visit was conducted today via telephone call.  HPI:   Chief Complaint: OBESITY Molly [name] is here to discuss her progress with her obesity treatment plan. She is on the Category 2 plan and is following her eating plan approximately 90% of the time. She states she is lifting weights 10-15 minutes 3-4 times per week. Molly [name] reports her weight to be 198 lbs. She is doing very well overall. She is trying to make sure she is getting all of her protein in.  We were unable to weigh the patient today for this TeleHealth visit. She reports her weight to be 198 lbs. She has lost 16 lbs since starting treatment with [name].  Diabetes II with Long-Term use of Insulin Molly [name] has a diagnosis of diabetes type II and is on metformin. Molly [name] states her fasting blood sugars range between 104 and 120. She denies any hypoglycemic episodes. Last A1c was 6.1 on 09/29/2018. She has been working on intensive lifestyle modifications including diet, exercise, and weight loss to help control her blood glucose levels. No nausea, vomiting, or diarrhea.  ASSESSMENT AND PLAN:  Type 2 diabetes mellitus without complication, without long-term current use of insulin ([facility])  Class 2 severe obesity with serious comorbidity and body mass index (BMI) of 36.0 to 36.9 in adult, unspecified obesity type ([place])  PLAN:  Diabetes II with Long-Term use of Insulin Molly [name] has been given extensive diabetes education by myself today including ideal fasting and post-prandial blood glucose readings, individual ideal HgA1c goals  and hypoglycemia prevention. We discussed the importance of good blood sugar control to decrease the likelihood of diabetic  complications such as nephropathy, neuropathy, limb loss, blindness, coronary artery disease, and death. We discussed the importance of intensive lifestyle modification including diet, exercise and weight loss as the first line treatment for diabetes. Molly [name] agrees to continue her diabetes medications and will follow-up at the agreed upon time.  Obesity Molly [name] is currently in the action stage of change. As such, her goal is to continue with weight loss efforts. She has agreed to follow the Category 2 plan. Molly [name] has been instructed to work up to a goal of 150 minutes of combined cardio and strengthening exercise per week for weight loss and overall health benefits. We discussed the following Behavioral Modification Strategies today: work on meal planning, easy cooking plans, and keeping healthy foods in the home.  Molly [name] has agreed to follow-up with our clinic in 2 weeks. She was informed of the importance of frequent follow-up visits to maximize her success with intensive lifestyle modifications for her multiple health conditions.  ALLERGIES: No Known Allergies  MEDICATIONS: Current Outpatient Medications on File Prior to Visit  Medication Sig Dispense Refill  . amLODipine (NORVASC) 10 MG tablet Take 1 tablet (10 mg total) by mouth daily. 30 tablet 0  . chlorthalidone (HYGROTON) 25 MG tablet Take 1 tablet (25 mg total) by mouth daily. 30 tablet 1  . Cholecalciferol (VITAMIN D3) 5000 units CAPS Take 1 capsule by mouth daily.    [name] co-enzyme Q-10 30 MG capsule Take 30 mg by mouth 3 (three) times daily.    . diazepam (VALIUM) 5 MG tablet Take 0.5 tablets (2.5 mg total) by mouth every 8 (  eight) hours as needed (vertigo/dizziness). 10 tablet 0  . escitalopram (LEXAPRO) 20 MG tablet     . fluticasone (FLONASE) 50 MCG/ACT nasal spray Place 2 sprays into both nostrils daily as needed for allergies or rhinitis. 16 g 5  . furosemide (LASIX) 20 MG tablet Take 20 mg by mouth as needed.     . irbesartan  (AVAPRO) 150 MG tablet Take 1 tablet (150 mg total) by mouth daily. 30 tablet 0  . levocetirizine (XYZAL) 5 MG tablet Take 1 tablet (5 mg total) by mouth every evening. 30 tablet 5  . losartan (COZAAR) 100 MG tablet Take 1 tablet (100 mg total) by mouth daily. 30 tablet 1  . metFORMIN (GLUCOPHAGE) 500 MG tablet Take 2 in the AM and 1 in the PM 90 tablet 0  . Multiple Vitamins-Minerals (MULTIVITAMIN ADULT PO) Take 1 tablet by mouth daily.    . Omega-3 Fatty Acids (FISH OIL PO) Take 1 tablet by mouth daily.    . ondansetron (ZOFRAN ODT) 4 MG disintegrating tablet Take 1 tablet (4 mg total) by mouth every 8 (eight) hours as needed for nausea or vomiting. 10 tablet 0  . rosuvastatin (CRESTOR) 10 MG tablet Take 1 tablet (10 mg total) by mouth daily. 30 tablet 0   No current facility-administered medications on file prior to visit.     PAST MEDICAL HISTORY: Past Medical History:  Diagnosis Date  . Anxiety   . Constipation   . Depression   . Diabetes type 2, controlled ([place])   . Diarrhea   . Fatigue   . Frequent urination   . GERD (gastroesophageal reflux disease)   . Hyperlipidemia   . Hypertension   . Kidney disease   . Leg cramps   . Multiple food allergies   . Muscle stiffness   . Seasonal allergies   . Shortness of breath   . Sinus pain   . Swallowing difficulty   . Swelling of extremity   . Wheezing     PAST SURGICAL HISTORY: Past Surgical History:  Procedure Laterality Date  . CHOLECYSTECTOMY  1999  . FOOT SURGERY    . MASTECTOMY      SOCIAL HISTORY: Social History   Tobacco Use  . Smoking status: Former Smoker    Types: Cigarettes    Last attempt to quit: 03/23/1998    Years since quitting: 20.7  . Smokeless tobacco: Never Used  Substance Use Topics  . Alcohol use: Yes    Comment: occasional  . Drug use: No    FAMILY HISTORY: Family History  Problem Relation Age of Onset  . Allergic rhinitis Sister   . Eczema Grandchild   . Eczema Daughter   .  Urticaria Daughter   . Lupus Other   . Hypertension Mother   . Stroke Mother   . Obesity Mother   . Hypertension Father   . Heart disease Father   . Sudden death Father   . Asthma Neg Hx   . Atopy Neg Hx   . Angioedema Neg Hx    ROS: Review of Systems  Gastrointestinal: Negative for diarrhea, nausea and vomiting.  Endo/Heme/Allergies:       Negative for hypoglycemia.   PHYSICAL EXAM: Pt in no acute distress  RECENT LABS AND TESTS: BMET    Component Value Date/Time   NA 141 09/29/2018 1052   K 3.6 09/29/2018 1052   CL 100 09/29/2018 1052   CO2 25 09/29/2018 1052   GLUCOSE 116 (H) 09/29/2018 1052  GLUCOSE 134 (H) 09/12/2017 2118   BUN 15 09/29/2018 1052   CREATININE 0.95 09/29/2018 1052   CREATININE 0.90 03/23/2016 1116   CALCIUM 10.5 (H) 09/29/2018 1052   GFRNONAA 60 09/29/2018 1052   GFRAA 69 09/29/2018 1052   Lab Results  Component Value Date   HGBA1C 6.1 (H) 09/29/2018   HGBA1C 5.9 (H) 05/05/2018   HGBA1C 6.0 (H) 12/21/2017   HGBA1C 6.5 (H) 08/31/2017   HGBA1C (H) 08/31/2010    6.0 (NOTE)                                                                       According to the ADA Clinical Practice Recommendations for 2011, when HbA1c is used as a screening test:   >=6.5%   Diagnostic of Diabetes Mellitus           (if abnormal result  is confirmed)  5.7-6.4%   Increased risk of developing Diabetes Mellitus  References:Diagnosis and Classification of Diabetes Mellitus,Diabetes QTMA,2633,35(KTGYB 1):S62-S69 and Standards of Medical Care in         Diabetes - 2011,Diabetes Care,2011,34  (Suppl 1):S11-S61.   Lab Results  Component Value Date   INSULIN 19.0 09/29/2018   INSULIN 17.2 05/05/2018   INSULIN 17.7 12/21/2017   INSULIN 20.4 08/31/2017   CBC    Component Value Date/Time   WBC 4.4 09/12/2017 2118   RBC 4.72 09/12/2017 2118   HGB 15.0 09/12/2017 2118   HGB 14.0 08/31/2017 1140   HCT 43.7 09/12/2017 2118   HCT 41.2 08/31/2017 1140   PLT 173  09/12/2017 2118   MCV 92.6 09/12/2017 2118   MCV 92 08/31/2017 1140   MCH 31.8 09/12/2017 2118   MCHC 34.3 09/12/2017 2118   RDW 13.1 09/12/2017 2118   RDW 13.7 08/31/2017 1140   LYMPHSABS 1.7 08/31/2017 1140   MONOABS 324 03/23/2016 1116   EOSABS 0.1 08/31/2017 1140   BASOSABS 0.0 08/31/2017 1140   Iron/TIBC/Ferritin/ %Sat No results found for: IRON, TIBC, FERRITIN, IRONPCTSAT Lipid Panel     Component Value Date/Time   CHOL 150 08/31/2017 1140   TRIG 105 08/31/2017 1140   HDL 66 08/31/2017 1140   CHOLHDL 2.6 08/31/2010 0540   VLDL 12 08/31/2010 0540   LDLCALC 63 08/31/2017 1140   Hepatic Function Panel     Component Value Date/Time   PROT 7.1 09/29/2018 1052   ALBUMIN 4.8 (H) 09/29/2018 1052   AST 18 09/29/2018 1052   ALT 16 09/29/2018 1052   ALKPHOS 54 09/29/2018 1052   BILITOT 0.3 09/29/2018 1052      Component Value Date/Time   TSH 1.680 08/31/2017 1140   TSH 1.57 03/23/2016 1116   Results for KAMIA, INSALACO (MRN [id number]) as of 12/22/2018 12:11  Ref. Range 09/29/2018 10:52  Vitamin D, 25-Hydroxy Latest Ref Range: 30.0 - 100.0 ng/mL 35.5    I, Michaelene Song, am acting as Location manager for [employer], PA-C I, Abby Potash, PA-C have reviewed above note and agree with its content

## 2019-01-04 ENCOUNTER — Encounter (INDEPENDENT_AMBULATORY_CARE_PROVIDER_SITE_OTHER): Payer: Self-pay | Admitting: Physician Assistant

## 2019-01-04 ENCOUNTER — Other Ambulatory Visit: Payer: Self-pay

## 2019-01-04 ENCOUNTER — Ambulatory Visit (INDEPENDENT_AMBULATORY_CARE_PROVIDER_SITE_OTHER): Payer: Medicare HMO | Admitting: Physician Assistant

## 2019-01-04 DIAGNOSIS — Z6836 Body mass index (BMI) 36.0-36.9, adult: Secondary | ICD-10-CM | POA: Diagnosis not present

## 2019-01-04 DIAGNOSIS — E119 Type 2 diabetes mellitus without complications: Secondary | ICD-10-CM

## 2019-01-04 DIAGNOSIS — I10 Essential (primary) hypertension: Secondary | ICD-10-CM | POA: Diagnosis not present

## 2019-01-05 MED ORDER — AMLODIPINE BESYLATE 10 MG PO TABS: 10.0000 mg | ORAL_TABLET | Freq: Every day | ORAL | 0 refills | Status: DC

## 2019-01-05 MED ORDER — ACCU-CHEK SAFE-T PRO LANCETS MISC: 1 refills | Status: DC

## 2019-01-05 MED ORDER — ACCU-CHEK ACTIVE VI STRP: ORAL_STRIP | 1 refills | Status: DC

## 2019-01-05 MED ORDER — CHLORTHALIDONE 25 MG PO TABS: 25.0000 mg | ORAL_TABLET | Freq: Every day | ORAL | 0 refills | Status: DC

## 2019-01-05 NOTE — Progress Notes (Signed)
Office: [phone]  /  Fax: [phone] TeleHealth Visit:  Molly [name] has verbally consented to this TeleHealth visit today. The patient is located at home, the provider is located at the [employer] [facility] office. The participants in this visit include the listed provider and patient and any and all parties involved. The visit was conducted today via telephone. Molly [name] was unable to use realtime audiovisual technology today and the telehealth visit was conducted via telephone.  HPI:   Chief Complaint: OBESITY [name] is here to discuss her progress with her obesity treatment plan. She is on the Category 2 plan and is following her eating plan approximately 75 % of the time. She states she is exercising 0 minutes 0 times per week. Jude's most recent weight is at 197 pounds. She reports doing well on the plan. She gets all of her food in, even if it's late in the evening. We were unable to weigh the patient today for this TeleHealth visit. She feels as if she has lost weight since her last visit. She has lost 26 lbs since starting treatment with [name].  Diabetes II without long term use of insulin Molly [name] has a diagnosis of diabetes type II. She is on Metformin. Molly [name] states fasting BGs range between 100 and 120 and she denies any hypoglycemic episodes. Last A1c was at 6.1 She has been working on intensive lifestyle modifications including diet, exercise, and weight loss to help control her blood glucose levels.  Hypertension Molly [name] is a 73 y.o. female with hypertension. She is on Chlorthalidone, Amlodipine and Losartan. Molly [name] denies chest pain or headache. She is working weight loss to help control her blood pressure with the goal of decreasing her risk of heart attack and stroke. Molly [name] blood pressure is currently controlled.  ASSESSMENT AND PLAN:  Essential hypertension - Plan: chlorthalidone (HYGROTON) 25 MG tablet, amLODipine (NORVASC) 10 MG tablet  Type 2 diabetes mellitus  without complication, without long-term current use of insulin (HCC) - Plan: Lancets (ACCU-CHEK SAFE-T PRO) lancets, glucose blood (ACCU-CHEK ACTIVE STRIPS) test strip  Class 2 severe obesity with serious comorbidity and body mass index (BMI) of 36.0 to 36.9 in adult, unspecified obesity type ([place])  PLAN:  Diabetes II without long term use of insulin Molly [name] has been given extensive diabetes education by myself today including ideal fasting and post-prandial blood glucose readings, individual ideal Hgb A1c goals and hypoglycemia prevention. We discussed the importance of good blood sugar control to decrease the likelihood of diabetic complications such as nephropathy, neuropathy, limb loss, blindness, coronary artery disease, and death. We discussed the importance of intensive lifestyle modification including diet, exercise and weight loss as the first line treatment for diabetes. Molly [name] agrees to continue Metformin and prescription was written today for Accu-chek strips #100 with 1 refill and Accu-chek lancets #100 with 1 refill. [name] agreed to follow up at the agreed upon time.  Hypertension We discussed sodium restriction, working on healthy weight loss, and a regular exercise program as the means to achieve improved blood pressure control. Molly [name] agreed with this plan and agreed to follow up as directed. We will continue to monitor her blood pressure as well as her progress with the above lifestyle modifications. She agrees to continue Chlorthalidone 25 mg once daily #30 with no refills and Amlodipine 10 mg once daily #30 with no refills and will watch for signs of hypotension as she continues her lifestyle modifications.  Obesity Molly [name] is currently in the action stage of change.  As such, her goal is to continue with weight loss efforts She has agreed to follow the Category 2 plan [name] has been instructed to work up to a goal of 150 minutes of combined cardio and strengthening exercise per week for  weight loss and overall health benefits. We discussed the following Behavioral Modification Strategies today: keeping healthy foods in the home and work on meal planning and easy cooking plans  Molly [name] has agreed to follow up with our clinic in 2 weeks. She was informed of the importance of frequent follow up visits to maximize her success with intensive lifestyle modifications for her multiple health conditions.  ALLERGIES: No Known Allergies  MEDICATIONS: Current Outpatient Medications on File Prior to Visit  Medication Sig Dispense Refill   Cholecalciferol (VITAMIN D3) 5000 units CAPS Take 1 capsule by mouth daily.     co-enzyme Q-10 30 MG capsule Take 30 mg by mouth 3 (three) times daily.     diazepam (VALIUM) 5 MG tablet Take 0.5 tablets (2.5 mg total) by mouth every 8 (eight) hours as needed (vertigo/dizziness). 10 tablet 0   escitalopram (LEXAPRO) 20 MG tablet      fluticasone (FLONASE) 50 MCG/ACT nasal spray Place 2 sprays into both nostrils daily as needed for allergies or rhinitis. 16 g 5   furosemide (LASIX) 20 MG tablet Take 20 mg by mouth as needed.      irbesartan (AVAPRO) 150 MG tablet Take 1 tablet (150 mg total) by mouth daily. 30 tablet 0   levocetirizine (XYZAL) 5 MG tablet Take 1 tablet (5 mg total) by mouth every evening. 30 tablet 5   losartan (COZAAR) 100 MG tablet Take 1 tablet (100 mg total) by mouth daily. 30 tablet 1   metFORMIN (GLUCOPHAGE) 500 MG tablet Take 2 in the AM and 1 in the PM 90 tablet 0   Multiple Vitamins-Minerals (MULTIVITAMIN ADULT PO) Take 1 tablet by mouth daily.     Omega-3 Fatty Acids (FISH OIL PO) Take 1 tablet by mouth daily.     ondansetron (ZOFRAN ODT) 4 MG disintegrating tablet Take 1 tablet (4 mg total) by mouth every 8 (eight) hours as needed for nausea or vomiting. 10 tablet 0   rosuvastatin (CRESTOR) 10 MG tablet Take 1 tablet (10 mg total) by mouth daily. 30 tablet 0   No current facility-administered medications on file  prior to visit.     PAST MEDICAL HISTORY: Past Medical History:  Diagnosis Date   Anxiety    Constipation    Depression    Diabetes type 2, controlled (HCC)    Diarrhea    Fatigue    Frequent urination    GERD (gastroesophageal reflux disease)    Hyperlipidemia    Hypertension    Kidney disease    Leg cramps    Multiple food allergies    Muscle stiffness    Seasonal allergies    Shortness of breath    Sinus pain    Swallowing difficulty    Swelling of extremity    Wheezing     PAST SURGICAL HISTORY: Past Surgical History:  Procedure Laterality Date   CHOLECYSTECTOMY  1999   FOOT SURGERY     MASTECTOMY      SOCIAL HISTORY: Social History   Tobacco Use   Smoking status: Former Smoker    Types: Cigarettes    Quit date: 03/23/1998    Years since quitting: 20.8   Smokeless tobacco: Never Used  Substance Use Topics   Alcohol use:  Yes    Comment: occasional   Drug use: No    FAMILY HISTORY: Family History  Problem Relation Age of Onset   Allergic rhinitis Sister    Eczema Grandchild    Eczema Daughter    Urticaria Daughter    Lupus Other    Hypertension Mother    Stroke Mother    Obesity Mother    Hypertension Father    Heart disease Father    Sudden death Father    Asthma Neg Hx    Atopy Neg Hx    Angioedema Neg Hx     ROS: Review of Systems  Constitutional: Positive for weight loss.  Cardiovascular: Negative for chest pain.  Neurological: Negative for headaches.  Endo/Heme/Allergies:       Negative for hypoglycemia    PHYSICAL EXAM: Pt in no acute distress  RECENT LABS AND TESTS: BMET    Component Value Date/Time   NA 141 09/29/2018 1052   K 3.6 09/29/2018 1052   CL 100 09/29/2018 1052   CO2 25 09/29/2018 1052   GLUCOSE 116 (H) 09/29/2018 1052   GLUCOSE 134 (H) 09/12/2017 2118   BUN 15 09/29/2018 1052   CREATININE 0.95 09/29/2018 1052   CREATININE 0.90 03/23/2016 1116   CALCIUM 10.5 (H)  09/29/2018 1052   GFRNONAA 60 09/29/2018 1052   GFRAA 69 09/29/2018 1052   Lab Results  Component Value Date   HGBA1C 6.1 (H) 09/29/2018   HGBA1C 5.9 (H) 05/05/2018   HGBA1C 6.0 (H) 12/21/2017   HGBA1C 6.5 (H) 08/31/2017   HGBA1C (H) 08/31/2010    6.0 (NOTE)                                                                       According to the ADA Clinical Practice Recommendations for 2011, when HbA1c is used as a screening test:   >=6.5%   Diagnostic of Diabetes Mellitus           (if abnormal result  is confirmed)  5.7-6.4%   Increased risk of developing Diabetes Mellitus  References:Diagnosis and Classification of Diabetes Mellitus,Diabetes MHDQ,2229,79(GXQJJ 1):S62-S69 and Standards of Medical Care in         Diabetes - 2011,Diabetes Care,2011,34  (Suppl 1):S11-S61.   Lab Results  Component Value Date   INSULIN 19.0 09/29/2018   INSULIN 17.2 05/05/2018   INSULIN 17.7 12/21/2017   INSULIN 20.4 08/31/2017   CBC    Component Value Date/Time   WBC 4.4 09/12/2017 2118   RBC 4.72 09/12/2017 2118   HGB 15.0 09/12/2017 2118   HGB 14.0 08/31/2017 1140   HCT 43.7 09/12/2017 2118   HCT 41.2 08/31/2017 1140   PLT 173 09/12/2017 2118   MCV 92.6 09/12/2017 2118   MCV 92 08/31/2017 1140   MCH 31.8 09/12/2017 2118   MCHC 34.3 09/12/2017 2118   RDW 13.1 09/12/2017 2118   RDW 13.7 08/31/2017 1140   LYMPHSABS 1.7 08/31/2017 1140   MONOABS 324 03/23/2016 1116   EOSABS 0.1 08/31/2017 1140   BASOSABS 0.0 08/31/2017 1140   Iron/TIBC/Ferritin/ %Sat No results found for: IRON, TIBC, FERRITIN, IRONPCTSAT Lipid Panel     Component Value Date/Time   CHOL 150 08/31/2017 1140   TRIG 105 08/31/2017 1140  HDL 66 08/31/2017 1140   CHOLHDL 2.6 08/31/2010 0540   VLDL 12 08/31/2010 0540   LDLCALC 63 08/31/2017 1140   Hepatic Function Panel     Component Value Date/Time   PROT 7.1 09/29/2018 1052   ALBUMIN 4.8 (H) 09/29/2018 1052   AST 18 09/29/2018 1052   ALT 16 09/29/2018 1052    ALKPHOS 54 09/29/2018 1052   BILITOT 0.3 09/29/2018 1052      Component Value Date/Time   TSH 1.680 08/31/2017 1140   TSH 1.57 03/23/2016 1116     Ref. Range 09/29/2018 10:52  Vitamin D, 25-Hydroxy Latest Ref Range: 30.0 - 100.0 ng/mL 35.5    I, Doreene Nest, am acting as Location manager for Abby Potash, PA-C I, Abby Potash, PA-C have reviewed above note and agree with its conten

## 2019-01-19 ENCOUNTER — Encounter (INDEPENDENT_AMBULATORY_CARE_PROVIDER_SITE_OTHER): Payer: Self-pay | Admitting: Physician Assistant

## 2019-01-19 ENCOUNTER — Ambulatory Visit (INDEPENDENT_AMBULATORY_CARE_PROVIDER_SITE_OTHER): Payer: Medicare HMO | Admitting: Physician Assistant

## 2019-01-19 ENCOUNTER — Other Ambulatory Visit: Payer: Self-pay

## 2019-01-19 DIAGNOSIS — Z6836 Body mass index (BMI) 36.0-36.9, adult: Secondary | ICD-10-CM | POA: Diagnosis not present

## 2019-01-19 DIAGNOSIS — E119 Type 2 diabetes mellitus without complications: Secondary | ICD-10-CM | POA: Diagnosis not present

## 2019-01-19 NOTE — Progress Notes (Signed)
Office: [phone]  /  Fax: [phone] TeleHealth Visit:  Molly [name] has verbally consented to this TeleHealth visit today. The patient is located at home, the provider is located at the [employer] [facility] office. The participants in this visit include the listed provider and patient. The visit was conducted today via telephone call.  HPI:   Chief Complaint: OBESITY Molly [name] is here to discuss her progress with her obesity treatment plan. She is on the Category 2 plan and is following her eating plan approximately 85% of the time. She states she is walking/resistance bands 10-15 minutes daily.  Camiya reports her weight to be 196 lbs. She continues to do well with following the plan. She reports that the plan is easy to follow and she is not having any issues with it.  We were unable to weigh the patient today for this TeleHealth visit. She feels as if she has lost weight since her last visit. She has lost 16 lbs since starting treatment with [name].  Diabetes II Without Long-Term Use of Insulin Molly [name] has a diagnosis of diabetes type II and is on metformin. Molly [name] states fasting blood sugars range between 100 and 120. Last A1c was 6.1 on 09/29/2018. She has been working on intensive lifestyle modifications including diet, exercise, and weight loss to help control her blood glucose levels.  ASSESSMENT AND PLAN:  Type 2 diabetes mellitus without complication, without long-term current use of insulin (HCC)  Class 2 severe obesity with serious comorbidity and body mass index (BMI) of 36.0 to 36.9 in adult, unspecified obesity type ([place])  PLAN:  Diabetes II Without Long-Term Use of Insulin Molly [name] has been given extensive diabetes education by myself today including ideal fasting and post-prandial blood glucose readings, individual ideal HgA1c goals  and hypoglycemia prevention. We discussed the importance of good blood sugar control to decrease the likelihood of diabetic complications such as  nephropathy, neuropathy, limb loss, blindness, coronary artery disease, and death. We discussed the importance of intensive lifestyle modification including diet, exercise and weight loss as the first line treatment for diabetes. Molly [name] agrees to continue her diabetes medications and will follow-up at the agreed upon time.  Obesity Molly [name] is currently in the action stage of change. As such, her goal is to continue with weight loss efforts. She has agreed to follow the Category 2 plan. Molly [name] has been instructed to work up to a goal of 150 minutes of combined cardio and strengthening exercise per week for weight loss and overall health benefits. We discussed the following Behavioral Modification Strategies today: work on meal planning, easy cooking plans, and keeping healthy foods in the home.  Molly [name] has agreed to follow-up with our clinic in 2-3 weeks. She was informed of the importance of frequent follow-up visits to maximize her success with intensive lifestyle modifications for her multiple health conditions.  ALLERGIES: No Known Allergies  MEDICATIONS: Current Outpatient Medications on File Prior to Visit  Medication Sig Dispense Refill   amLODipine (NORVASC) 10 MG tablet Take 1 tablet (10 mg total) by mouth daily. 30 tablet 0   chlorthalidone (HYGROTON) 25 MG tablet Take 1 tablet (25 mg total) by mouth daily. 30 tablet 0   Cholecalciferol (VITAMIN D3) 5000 units CAPS Take 1 capsule by mouth daily.     co-enzyme Q-10 30 MG capsule Take 30 mg by mouth 3 (three) times daily.     diazepam (VALIUM) 5 MG tablet Take 0.5 tablets (2.5 mg total) by mouth every 8 (eight) hours  as needed (vertigo/dizziness). 10 tablet 0   escitalopram (LEXAPRO) 20 MG tablet      fluticasone (FLONASE) 50 MCG/ACT nasal spray Place 2 sprays into both nostrils daily as needed for allergies or rhinitis. 16 g 5   furosemide (LASIX) 20 MG tablet Take 20 mg by mouth as needed.      glucose blood (ACCU-CHEK ACTIVE  STRIPS) test strip Use as instructed 100 each 1   irbesartan (AVAPRO) 150 MG tablet Take 1 tablet (150 mg total) by mouth daily. 30 tablet 0   Lancets (ACCU-CHEK SAFE-T PRO) lancets Use as instructed 100 each 1   levocetirizine (XYZAL) 5 MG tablet Take 1 tablet (5 mg total) by mouth every evening. 30 tablet 5   losartan (COZAAR) 100 MG tablet Take 1 tablet (100 mg total) by mouth daily. 30 tablet 1   metFORMIN (GLUCOPHAGE) 500 MG tablet Take 2 in the AM and 1 in the PM 90 tablet 0   Multiple Vitamins-Minerals (MULTIVITAMIN ADULT PO) Take 1 tablet by mouth daily.     Omega-3 Fatty Acids (FISH OIL PO) Take 1 tablet by mouth daily.     ondansetron (ZOFRAN ODT) 4 MG disintegrating tablet Take 1 tablet (4 mg total) by mouth every 8 (eight) hours as needed for nausea or vomiting. 10 tablet 0   rosuvastatin (CRESTOR) 10 MG tablet Take 1 tablet (10 mg total) by mouth daily. 30 tablet 0   No current facility-administered medications on file prior to visit.     PAST MEDICAL HISTORY: Past Medical History:  Diagnosis Date   Anxiety    Constipation    Depression    Diabetes type 2, controlled (HCC)    Diarrhea    Fatigue    Frequent urination    GERD (gastroesophageal reflux disease)    Hyperlipidemia    Hypertension    Kidney disease    Leg cramps    Multiple food allergies    Muscle stiffness    Seasonal allergies    Shortness of breath    Sinus pain    Swallowing difficulty    Swelling of extremity    Wheezing     PAST SURGICAL HISTORY: Past Surgical History:  Procedure Laterality Date   CHOLECYSTECTOMY  1999   FOOT SURGERY     MASTECTOMY      SOCIAL HISTORY: Social History   Tobacco Use   Smoking status: Former Smoker    Types: Cigarettes    Quit date: 03/23/1998    Years since quitting: 20.8   Smokeless tobacco: Never Used  Substance Use Topics   Alcohol use: Yes    Comment: occasional   Drug use: No    FAMILY HISTORY: Family  History  Problem Relation Age of Onset   Allergic rhinitis Sister    Eczema Grandchild    Eczema Daughter    Urticaria Daughter    Lupus Other    Hypertension Mother    Stroke Mother    Obesity Mother    Hypertension Father    Heart disease Father    Sudden death Father    Asthma Neg Hx    Atopy Neg Hx    Angioedema Neg Hx    ROS: ROS none noted.  PHYSICAL EXAM: Pt in no acute distress  RECENT LABS AND TESTS: BMET    Component Value Date/Time   NA 141 09/29/2018 1052   K 3.6 09/29/2018 1052   CL 100 09/29/2018 1052   CO2 25 09/29/2018 1052   GLUCOSE  116 (H) 09/29/2018 1052   GLUCOSE 134 (H) 09/12/2017 2118   BUN 15 09/29/2018 1052   CREATININE 0.95 09/29/2018 1052   CREATININE 0.90 03/23/2016 1116   CALCIUM 10.5 (H) 09/29/2018 1052   GFRNONAA 60 09/29/2018 1052   GFRAA 69 09/29/2018 1052   Lab Results  Component Value Date   HGBA1C 6.1 (H) 09/29/2018   HGBA1C 5.9 (H) 05/05/2018   HGBA1C 6.0 (H) 12/21/2017   HGBA1C 6.5 (H) 08/31/2017   HGBA1C (H) 08/31/2010    6.0 (NOTE)                                                                       According to the ADA Clinical Practice Recommendations for 2011, when HbA1c is used as a screening test:   >=6.5%   Diagnostic of Diabetes Mellitus           (if abnormal result  is confirmed)  5.7-6.4%   Increased risk of developing Diabetes Mellitus  References:Diagnosis and Classification of Diabetes Mellitus,Diabetes VOHY,0737,10(GYIRS 1):S62-S69 and Standards of Medical Care in         Diabetes - 2011,Diabetes Care,2011,34  (Suppl 1):S11-S61.   Lab Results  Component Value Date   INSULIN 19.0 09/29/2018   INSULIN 17.2 05/05/2018   INSULIN 17.7 12/21/2017   INSULIN 20.4 08/31/2017   CBC    Component Value Date/Time   WBC 4.4 09/12/2017 2118   RBC 4.72 09/12/2017 2118   HGB 15.0 09/12/2017 2118   HGB 14.0 08/31/2017 1140   HCT 43.7 09/12/2017 2118   HCT 41.2 08/31/2017 1140   PLT 173 09/12/2017  2118   MCV 92.6 09/12/2017 2118   MCV 92 08/31/2017 1140   MCH 31.8 09/12/2017 2118   MCHC 34.3 09/12/2017 2118   RDW 13.1 09/12/2017 2118   RDW 13.7 08/31/2017 1140   LYMPHSABS 1.7 08/31/2017 1140   MONOABS 324 03/23/2016 1116   EOSABS 0.1 08/31/2017 1140   BASOSABS 0.0 08/31/2017 1140   Iron/TIBC/Ferritin/ %Sat No results found for: IRON, TIBC, FERRITIN, IRONPCTSAT Lipid Panel     Component Value Date/Time   CHOL 150 08/31/2017 1140   TRIG 105 08/31/2017 1140   HDL 66 08/31/2017 1140   CHOLHDL 2.6 08/31/2010 0540   VLDL 12 08/31/2010 0540   LDLCALC 63 08/31/2017 1140   Hepatic Function Panel     Component Value Date/Time   PROT 7.1 09/29/2018 1052   ALBUMIN 4.8 (H) 09/29/2018 1052   AST 18 09/29/2018 1052   ALT 16 09/29/2018 1052   ALKPHOS 54 09/29/2018 1052   BILITOT 0.3 09/29/2018 1052      Component Value Date/Time   TSH 1.680 08/31/2017 1140   TSH 1.57 03/23/2016 1116   Results for SERRA, YOUNAN (MRN [id number]) as of 01/19/2019 14:12  Ref. Range 09/29/2018 10:52  Vitamin D, 25-Hydroxy Latest Ref Range: 30.0 - 100.0 ng/mL 35.5    I, Michaelene Song, am acting as Location manager for [employer], PA-C I, Abby Potash, PA-C have reviewed above note and agree with its content

## 2019-01-29 ENCOUNTER — Other Ambulatory Visit (INDEPENDENT_AMBULATORY_CARE_PROVIDER_SITE_OTHER): Payer: Self-pay | Admitting: Physician Assistant

## 2019-01-29 DIAGNOSIS — I10 Essential (primary) hypertension: Secondary | ICD-10-CM

## 2019-02-01 ENCOUNTER — Other Ambulatory Visit (INDEPENDENT_AMBULATORY_CARE_PROVIDER_SITE_OTHER): Payer: Self-pay | Admitting: Physician Assistant

## 2019-02-01 DIAGNOSIS — I10 Essential (primary) hypertension: Secondary | ICD-10-CM

## 2019-02-06 ENCOUNTER — Telehealth (INDEPENDENT_AMBULATORY_CARE_PROVIDER_SITE_OTHER): Payer: Medicare HMO | Admitting: Physician Assistant

## 2019-02-06 ENCOUNTER — Other Ambulatory Visit: Payer: Self-pay

## 2019-02-06 ENCOUNTER — Encounter (INDEPENDENT_AMBULATORY_CARE_PROVIDER_SITE_OTHER): Payer: Self-pay | Admitting: Physician Assistant

## 2019-02-06 DIAGNOSIS — Z6836 Body mass index (BMI) 36.0-36.9, adult: Secondary | ICD-10-CM

## 2019-02-06 DIAGNOSIS — I10 Essential (primary) hypertension: Secondary | ICD-10-CM | POA: Diagnosis not present

## 2019-02-06 DIAGNOSIS — E119 Type 2 diabetes mellitus without complications: Secondary | ICD-10-CM

## 2019-02-06 MED ORDER — LOSARTAN POTASSIUM 100 MG PO TABS: 100.0000 mg | ORAL_TABLET | Freq: Every day | ORAL | 1 refills | Status: DC

## 2019-02-06 NOTE — Progress Notes (Signed)
Office: [phone]  /  Fax: [phone] TeleHealth Visit:  [name] has verbally consented to this TeleHealth visit today. The patient is located at home, the provider is located at the [employer] [facility] office. The participants in this visit include the listed provider and patient. The visit was conducted today via telephone call.  HPI:   Chief Complaint: OBESITY Molly [name] is here to discuss her progress with her obesity treatment plan. She is on the Category 2 plan and is following her eating plan approximately 90% of the time. She states she is doing dance stretches at home daily.  Molly [name] states her weight today is 196 lbs. She reports that she sometimes eats potatoes, but only once a week.  We were unable to weigh the patient today for this TeleHealth visit. She feels as if she has maintained her weight since her last visit. She has lost 16 lbs since starting treatment with [name].  Hypertension Molly [name] is a 73 y.o. female with hypertension.  Molly [name] denies chest pain or headache. She is working weight loss to help control her blood pressure with the goal of decreasing her risk of heart attack and stroke.  Diabetes II Molly [name] has a diagnosis of diabetes type II and is on metformin. Molly [name] states fasting blood sugars range between 100 and 128. Last A1c was 6.1 on 09/29/2018. She has been working on intensive lifestyle modifications including diet, exercise, and weight loss to help control her blood glucose levels. No nausea, vomiting, diarrhea, or polyphagia.  ASSESSMENT AND PLAN:  Essential hypertension - Plan: losartan (COZAAR) 100 MG tablet  Type 2 diabetes mellitus without complication, without long-term current use of insulin (HCC)  Class 2 severe obesity with serious comorbidity and body mass index (BMI) of 36.0 to 36.9 in adult, unspecified obesity type ([place])  PLAN:  Hypertension We discussed sodium restriction, working on healthy weight loss, and a regular exercise  program as the means to achieve improved blood pressure control. Molly [name] agreed with this plan and agreed to follow up as directed. We will continue to monitor her blood pressure as well as her progress with the above lifestyle modifications. Molly [name] was given a refill on her losartan #30 with 0 refills and she agrees to follow-up with our clinic in 3 weeks. She will watch for signs of hypotension as she continues her lifestyle modifications.  Diabetes II Molly [name] has been given extensive diabetes education by myself today including ideal fasting and post-prandial blood glucose readings, individual ideal HgA1c goals  and hypoglycemia prevention. We discussed the importance of good blood sugar control to decrease the likelihood of diabetic complications such as nephropathy, neuropathy, limb loss, blindness, coronary artery disease, and death. We discussed the importance of intensive lifestyle modification including diet, exercise and weight loss as the first line treatment for diabetes. Molly [name] agrees to continue her diabetes medications and will follow-up at the agreed upon time.  Obesity Molly [name] is currently in the action stage of change. As such, her goal is to continue with weight loss efforts She has agreed to follow the Category 2 plan Molly [name] has been instructed to work up to a goal of 150 minutes of combined cardio and strengthening exercise per week for weight loss and overall health benefits. We discussed the following Behavioral Modification Stratagies today: work on meal planning and easy cooking plans, and keeping healthy foods in the home.  Molly [name] has agreed to follow-up with our clinic in 3 weeks. She was informed of the importance of  frequent follow-up visits to maximize her success with intensive lifestyle modifications for her multiple health conditions.  ALLERGIES: No Known Allergies  MEDICATIONS: Current Outpatient Medications on File Prior to Visit  Medication Sig Dispense Refill   amLODipine  (NORVASC) 10 MG tablet Take 1 tablet (10 mg total) by mouth daily. 30 tablet 0   chlorthalidone (HYGROTON) 25 MG tablet Take 1 tablet (25 mg total) by mouth daily. 30 tablet 0   Cholecalciferol (VITAMIN D3) 5000 units CAPS Take 1 capsule by mouth daily.     co-enzyme Q-10 30 MG capsule Take 30 mg by mouth 3 (three) times daily.     diazepam (VALIUM) 5 MG tablet Take 0.5 tablets (2.5 mg total) by mouth every 8 (eight) hours as needed (vertigo/dizziness). 10 tablet 0   escitalopram (LEXAPRO) 20 MG tablet      fluticasone (FLONASE) 50 MCG/ACT nasal spray Place 2 sprays into both nostrils daily as needed for allergies or rhinitis. 16 g 5   furosemide (LASIX) 20 MG tablet Take 20 mg by mouth as needed.      glucose blood (ACCU-CHEK ACTIVE STRIPS) test strip Use as instructed 100 each 1   irbesartan (AVAPRO) 150 MG tablet Take 1 tablet (150 mg total) by mouth daily. 30 tablet 0   Lancets (ACCU-CHEK SAFE-T PRO) lancets Use as instructed 100 each 1   levocetirizine (XYZAL) 5 MG tablet Take 1 tablet (5 mg total) by mouth every evening. 30 tablet 5   metFORMIN (GLUCOPHAGE) 500 MG tablet Take 2 in the AM and 1 in the PM 90 tablet 0   Multiple Vitamins-Minerals (MULTIVITAMIN ADULT PO) Take 1 tablet by mouth daily.     Omega-3 Fatty Acids (FISH OIL PO) Take 1 tablet by mouth daily.     ondansetron (ZOFRAN ODT) 4 MG disintegrating tablet Take 1 tablet (4 mg total) by mouth every 8 (eight) hours as needed for nausea or vomiting. 10 tablet 0   rosuvastatin (CRESTOR) 10 MG tablet Take 1 tablet (10 mg total) by mouth daily. 30 tablet 0   No current facility-administered medications on file prior to visit.     PAST MEDICAL HISTORY: Past Medical History:  Diagnosis Date   Anxiety    Constipation    Depression    Diabetes type 2, controlled (HCC)    Diarrhea    Fatigue    Frequent urination    GERD (gastroesophageal reflux disease)    Hyperlipidemia    Hypertension    Kidney  disease    Leg cramps    Multiple food allergies    Muscle stiffness    Seasonal allergies    Shortness of breath    Sinus pain    Swallowing difficulty    Swelling of extremity    Wheezing     PAST SURGICAL HISTORY: Past Surgical History:  Procedure Laterality Date   CHOLECYSTECTOMY  1999   FOOT SURGERY     MASTECTOMY      SOCIAL HISTORY: Social History   Tobacco Use   Smoking status: Former Smoker    Types: Cigarettes    Quit date: 03/23/1998    Years since quitting: 20.8   Smokeless tobacco: Never Used  Substance Use Topics   Alcohol use: Yes    Comment: occasional   Drug use: No    FAMILY HISTORY: Family History  Problem Relation Age of Onset   Allergic rhinitis Sister    Eczema Grandchild    Eczema Daughter    Urticaria Daughter  Lupus Other    Hypertension Mother    Stroke Mother    Obesity Mother    Hypertension Father    Heart disease Father    Sudden death Father    Asthma Neg Hx    Atopy Neg Hx    Angioedema Neg Hx    ROS: Review of Systems  Cardiovascular: Negative for chest pain.  Gastrointestinal: Negative for diarrhea, nausea and vomiting.  Neurological: Negative for headaches.  Endo/Heme/Allergies:       Negative for polyphagia.   PHYSICAL EXAM: Pt in no acute distress  RECENT LABS AND TESTS: BMET    Component Value Date/Time   NA 141 09/29/2018 1052   K 3.6 09/29/2018 1052   CL 100 09/29/2018 1052   CO2 25 09/29/2018 1052   GLUCOSE 116 (H) 09/29/2018 1052   GLUCOSE 134 (H) 09/12/2017 2118   BUN 15 09/29/2018 1052   CREATININE 0.95 09/29/2018 1052   CREATININE 0.90 03/23/2016 1116   CALCIUM 10.5 (H) 09/29/2018 1052   GFRNONAA 60 09/29/2018 1052   GFRAA 69 09/29/2018 1052   Lab Results  Component Value Date   HGBA1C 6.1 (H) 09/29/2018   HGBA1C 5.9 (H) 05/05/2018   HGBA1C 6.0 (H) 12/21/2017   HGBA1C 6.5 (H) 08/31/2017   HGBA1C (H) 08/31/2010    6.0 (NOTE)                                                                        According to the ADA Clinical Practice Recommendations for 2011, when HbA1c is used as a screening test:   >=6.5%   Diagnostic of Diabetes Mellitus           (if abnormal result  is confirmed)  5.7-6.4%   Increased risk of developing Diabetes Mellitus  References:Diagnosis and Classification of Diabetes Mellitus,Diabetes IONG,2952,84(XLKGM 1):S62-S69 and Standards of Medical Care in         Diabetes - 2011,Diabetes Care,2011,34  (Suppl 1):S11-S61.   Lab Results  Component Value Date   INSULIN 19.0 09/29/2018   INSULIN 17.2 05/05/2018   INSULIN 17.7 12/21/2017   INSULIN 20.4 08/31/2017   CBC    Component Value Date/Time   WBC 4.4 09/12/2017 2118   RBC 4.72 09/12/2017 2118   HGB 15.0 09/12/2017 2118   HGB 14.0 08/31/2017 1140   HCT 43.7 09/12/2017 2118   HCT 41.2 08/31/2017 1140   PLT 173 09/12/2017 2118   MCV 92.6 09/12/2017 2118   MCV 92 08/31/2017 1140   MCH 31.8 09/12/2017 2118   MCHC 34.3 09/12/2017 2118   RDW 13.1 09/12/2017 2118   RDW 13.7 08/31/2017 1140   LYMPHSABS 1.7 08/31/2017 1140   MONOABS 324 03/23/2016 1116   EOSABS 0.1 08/31/2017 1140   BASOSABS 0.0 08/31/2017 1140   Iron/TIBC/Ferritin/ %Sat No results found for: IRON, TIBC, FERRITIN, IRONPCTSAT Lipid Panel     Component Value Date/Time   CHOL 150 08/31/2017 1140   TRIG 105 08/31/2017 1140   HDL 66 08/31/2017 1140   CHOLHDL 2.6 08/31/2010 0540   VLDL 12 08/31/2010 0540   LDLCALC 63 08/31/2017 1140   Hepatic Function Panel     Component Value Date/Time   PROT 7.1 09/29/2018 1052   ALBUMIN 4.8 (H) 09/29/2018  1052   AST 18 09/29/2018 1052   ALT 16 09/29/2018 1052   ALKPHOS 54 09/29/2018 1052   BILITOT 0.3 09/29/2018 1052      Component Value Date/Time   TSH 1.680 08/31/2017 1140   TSH 1.57 03/23/2016 1116   Results for MARTINA, BRODBECK (MRN [id number]) as of 02/06/2019 15:56  Ref. Range 09/29/2018 10:52  Vitamin D, 25-Hydroxy Latest Ref Range: 30.0 - 100.0 ng/mL  35.5   I, Michaelene Song, am acting as Location manager for [employer], PA-C I, Abby Potash, PA-C have reviewed above note and agree with its content

## 2019-02-08 ENCOUNTER — Telehealth (INDEPENDENT_AMBULATORY_CARE_PROVIDER_SITE_OTHER): Payer: Self-pay | Admitting: Bariatrics

## 2019-02-08 NOTE — Telephone Encounter (Signed)
Pharmacy ([facility], [address] in [facility]) hasn't received refill request for [place].  Patient states she took her last pill this morning.

## 2019-02-08 NOTE — Telephone Encounter (Signed)
Spoke with two pharmacies Losartan is on back order and unsure when they will get it again. Please advise. Renee Ramus, LPN

## 2019-02-09 ENCOUNTER — Other Ambulatory Visit (INDEPENDENT_AMBULATORY_CARE_PROVIDER_SITE_OTHER): Payer: Self-pay

## 2019-02-09 DIAGNOSIS — I10 Essential (primary) hypertension: Secondary | ICD-10-CM

## 2019-02-09 MED ORDER — LOSARTAN POTASSIUM 100 MG PO TABS: 100.0000 mg | ORAL_TABLET | Freq: Every day | ORAL | 0 refills | Status: DC

## 2019-02-09 NOTE — Telephone Encounter (Signed)
Theres a pharmacy called [facility] on [address] that is carrying it and will deliver to her. Can you check with them please?

## 2019-02-14 NOTE — Telephone Encounter (Signed)
Hey , this is still showing up in my box so not sure if you got this message. Let me know what you found out. Thanks!

## 2019-02-14 NOTE — Telephone Encounter (Signed)
It was sent over to [facility] on Thurs. Renee Ramus, LPN

## 2019-02-16 NOTE — Telephone Encounter (Signed)
Ok great thanks! °

## 2019-02-23 ENCOUNTER — Other Ambulatory Visit (INDEPENDENT_AMBULATORY_CARE_PROVIDER_SITE_OTHER): Payer: Self-pay | Admitting: Physician Assistant

## 2019-02-23 DIAGNOSIS — I10 Essential (primary) hypertension: Secondary | ICD-10-CM

## 2019-03-02 ENCOUNTER — Other Ambulatory Visit: Payer: Self-pay

## 2019-03-02 ENCOUNTER — Encounter (INDEPENDENT_AMBULATORY_CARE_PROVIDER_SITE_OTHER): Payer: Self-pay | Admitting: Bariatrics

## 2019-03-02 ENCOUNTER — Ambulatory Visit (INDEPENDENT_AMBULATORY_CARE_PROVIDER_SITE_OTHER): Payer: Medicare HMO | Admitting: Bariatrics

## 2019-03-02 DIAGNOSIS — E119 Type 2 diabetes mellitus without complications: Secondary | ICD-10-CM

## 2019-03-02 DIAGNOSIS — I10 Essential (primary) hypertension: Secondary | ICD-10-CM

## 2019-03-02 DIAGNOSIS — Z6837 Body mass index (BMI) 37.0-37.9, adult: Secondary | ICD-10-CM

## 2019-03-02 MED ORDER — LOSARTAN POTASSIUM 100 MG PO TABS: 100.0000 mg | ORAL_TABLET | Freq: Every day | ORAL | 0 refills | Status: DC

## 2019-03-02 MED ORDER — CHLORTHALIDONE 25 MG PO TABS: 25.0000 mg | ORAL_TABLET | Freq: Every day | ORAL | 0 refills | Status: DC

## 2019-03-06 ENCOUNTER — Encounter (INDEPENDENT_AMBULATORY_CARE_PROVIDER_SITE_OTHER): Payer: Self-pay | Admitting: Bariatrics

## 2019-03-06 NOTE — Progress Notes (Addendum)
Office: [phone]  /  Fax: [phone] TeleHealth Visit:  Molly [name] has verbally consented to this TeleHealth visit today. The patient is located at home, the provider is located at the [employer] [facility] office. The participants in this visit include the listed provider and patient. The visit was conducted today via telephone call ( approximately 22 minutes ).   HPI:   Chief Complaint: OBESITY Molly [name] is here to discuss her progress with her obesity treatment plan. She is on the Category 2 plan and is following her eating plan approximately 80% of the time. She states she is exercising with bands/pedaling/weights 10-20 minutes 3 times per week. [name] states her weight has remained the same. We were unable to weigh the patient today for this TeleHealth visit. She feels as if she has maintained her weight since her last visit. She has lost 16 lbs since starting treatment with Molly [name].  Diabetes II [name] has a diagnosis of diabetes type II and is well controlled. [name] does not report checking her blood sugars. [name] reports her last A1c was decreased to 6 last week. She has been working on intensive lifestyle modifications including diet, exercise, and weight loss to help control her blood glucose levels.  Hypertension [name] is a 73 y.o. female with hypertension. [name] denies lightheadedness. She is working weight loss to help control her blood pressure with the goal of decreasing her risk of heart attack and stroke. [name]'s blood pressure is well controlled.  ASSESSMENT AND PLAN:  Type 2 diabetes mellitus without complication, without long-term current use of insulin (HCC)  Essential hypertension - Plan: losartan (COZAAR) 100 MG tablet, chlorthalidone (HYGROTON) 25 MG tablet  Class 2 severe obesity with serious comorbidity and body mass index (BMI) of 37.0 to 37.9 in adult, unspecified obesity type ([place])  PLAN:  Diabetes II Molly [name] has been given extensive diabetes education  by myself today including ideal fasting and post-prandial blood glucose readings, individual ideal HgA1c goals  and hypoglycemia prevention. We discussed the importance of good blood sugar control to decrease the likelihood of diabetic complications such as nephropathy, neuropathy, limb loss, blindness, coronary artery disease, and death. We discussed the importance of intensive lifestyle modification including diet, exercise and weight loss as the first line treatment for diabetes. [name] was given a prescription for chlorthalidone 25 mg 1 PO daily #30 with 0 refills and a prescription for losartan 100 mg 1 PO daily #30 with 0 refills. She agrees to follow-up with our clinic in 2-3 weeks.  Hypertension We discussed sodium restriction, working on healthy weight loss, and a regular exercise program as the means to achieve improved blood pressure control. Molly [name] agreed with this plan and agreed to follow up as directed. We will continue to monitor her blood pressure as well as her progress with the above lifestyle modifications. She will continue her medications as prescribed and will watch for signs of hypotension as she continues her lifestyle modifications.  Obesity [name] is currently in the action stage of change. As such, her goal is to continue with weight loss efforts. She has agreed to follow the Category 2 plan. Molly [name] will work on meal planning and intentional eating. She will not use any added sugar. Molly [name] has been instructed to continue her current exercise regimen for weight loss and overall health benefits. We discussed the following Behavioral Modification Strategies today: increasing lean protein intake, decreasing simple carbohydrates, increasing vegetables, increase H20 intake, decrease eating out, no skipping meals, work on meal  planning and easy cooking plans, and keeping healthy foods in the home.  Molly [name] has agreed to follow-up with our clinic in 2-3 weeks. She was informed of the importance  of frequent follow-up visits to maximize her success with intensive lifestyle modifications for her multiple health conditions.  ALLERGIES: No Known Allergies  MEDICATIONS: Current Outpatient Medications on File Prior to Visit  Medication Sig Dispense Refill  . amLODipine (NORVASC) 10 MG tablet Take 1 tablet (10 mg total) by mouth daily. 30 tablet 0  . Cholecalciferol (VITAMIN D3) 5000 units CAPS Take 1 capsule by mouth daily.    Molly [name] co-enzyme Q-10 30 MG capsule Take 30 mg by mouth 3 (three) times daily.    . diazepam (VALIUM) 5 MG tablet Take 0.5 tablets (2.5 mg total) by mouth every 8 (eight) hours as needed (vertigo/dizziness). 10 tablet 0  . escitalopram (LEXAPRO) 20 MG tablet     . fluticasone (FLONASE) 50 MCG/ACT nasal spray Place 2 sprays into both nostrils daily as needed for allergies or rhinitis. 16 g 5  . furosemide (LASIX) 20 MG tablet Take 20 mg by mouth as needed.     Molly [name] glucose blood (ACCU-CHEK ACTIVE STRIPS) test strip Use as instructed 100 each 1  . irbesartan (AVAPRO) 150 MG tablet Take 1 tablet (150 mg total) by mouth daily. 30 tablet 0  . Lancets (ACCU-CHEK SAFE-T PRO) lancets Use as instructed 100 each 1  . levocetirizine (XYZAL) 5 MG tablet Take 1 tablet (5 mg total) by mouth every evening. 30 tablet 5  . metFORMIN (GLUCOPHAGE) 500 MG tablet Take 2 in the AM and 1 in the PM 90 tablet 0  . Multiple Vitamins-Minerals (MULTIVITAMIN ADULT PO) Take 1 tablet by mouth daily.    . Omega-3 Fatty Acids (FISH OIL PO) Take 1 tablet by mouth daily.    . ondansetron (ZOFRAN ODT) 4 MG disintegrating tablet Take 1 tablet (4 mg total) by mouth every 8 (eight) hours as needed for nausea or vomiting. 10 tablet 0  . rosuvastatin (CRESTOR) 10 MG tablet Take 1 tablet (10 mg total) by mouth daily. 30 tablet 0   No current facility-administered medications on file prior to visit.     PAST MEDICAL HISTORY: Past Medical History:  Diagnosis Date  . Anxiety   . Constipation   . Depression    . Diabetes type 2, controlled ([place])   . Diarrhea   . Fatigue   . Frequent urination   . GERD (gastroesophageal reflux disease)   . Hyperlipidemia   . Hypertension   . Kidney disease   . Leg cramps   . Multiple food allergies   . Muscle stiffness   . Seasonal allergies   . Shortness of breath   . Sinus pain   . Swallowing difficulty   . Swelling of extremity   . Wheezing     PAST SURGICAL HISTORY: Past Surgical History:  Procedure Laterality Date  . CHOLECYSTECTOMY  1999  . FOOT SURGERY    . MASTECTOMY      SOCIAL HISTORY: Social History   Tobacco Use  . Smoking status: Former Smoker    Types: Cigarettes    Quit date: 03/23/1998    Years since quitting: 20.9  . Smokeless tobacco: Never Used  Substance Use Topics  . Alcohol use: Yes    Comment: occasional  . Drug use: No    FAMILY HISTORY: Family History  Problem Relation Age of Onset  . Allergic rhinitis Sister   . Eczema  Grandchild   . Eczema Daughter   . Urticaria Daughter   . Lupus Other   . Hypertension Mother   . Stroke Mother   . Obesity Mother   . Hypertension Father   . Heart disease Father   . Sudden death Father   . Asthma Neg Hx   . Atopy Neg Hx   . Angioedema Neg Hx    ROS: Review of Systems  Neurological:       Negative for lightheadedness.   PHYSICAL EXAM: Pt in no acute distress  RECENT LABS AND TESTS: BMET    Component Value Date/Time   NA 141 09/29/2018 1052   K 3.6 09/29/2018 1052   CL 100 09/29/2018 1052   CO2 25 09/29/2018 1052   GLUCOSE 116 (H) 09/29/2018 1052   GLUCOSE 134 (H) 09/12/2017 2118   BUN 15 09/29/2018 1052   CREATININE 0.95 09/29/2018 1052   CREATININE 0.90 03/23/2016 1116   CALCIUM 10.5 (H) 09/29/2018 1052   GFRNONAA 60 09/29/2018 1052   GFRAA 69 09/29/2018 1052   Lab Results  Component Value Date   HGBA1C 6.1 (H) 09/29/2018   HGBA1C 5.9 (H) 05/05/2018   HGBA1C 6.0 (H) 12/21/2017   HGBA1C 6.5 (H) 08/31/2017   HGBA1C (H) 08/31/2010    6.0  (NOTE)                                                                       According to the ADA Clinical Practice Recommendations for 2011, when HbA1c is used as a screening test:   >=6.5%   Diagnostic of Diabetes Mellitus           (if abnormal result  is confirmed)  5.7-6.4%   Increased risk of developing Diabetes Mellitus  References:Diagnosis and Classification of Diabetes Mellitus,Diabetes AVWU,9811,91(YNWGN 1):S62-S69 and Standards of Medical Care in         Diabetes - 2011,Diabetes Care,2011,34  (Suppl 1):S11-S61.   Lab Results  Component Value Date   INSULIN 19.0 09/29/2018   INSULIN 17.2 05/05/2018   INSULIN 17.7 12/21/2017   INSULIN 20.4 08/31/2017   CBC    Component Value Date/Time   WBC 4.4 09/12/2017 2118   RBC 4.72 09/12/2017 2118   HGB 15.0 09/12/2017 2118   HGB 14.0 08/31/2017 1140   HCT 43.7 09/12/2017 2118   HCT 41.2 08/31/2017 1140   PLT 173 09/12/2017 2118   MCV 92.6 09/12/2017 2118   MCV 92 08/31/2017 1140   MCH 31.8 09/12/2017 2118   MCHC 34.3 09/12/2017 2118   RDW 13.1 09/12/2017 2118   RDW 13.7 08/31/2017 1140   LYMPHSABS 1.7 08/31/2017 1140   MONOABS 324 03/23/2016 1116   EOSABS 0.1 08/31/2017 1140   BASOSABS 0.0 08/31/2017 1140   Iron/TIBC/Ferritin/ %Sat No results found for: IRON, TIBC, FERRITIN, IRONPCTSAT Lipid Panel     Component Value Date/Time   CHOL 150 08/31/2017 1140   TRIG 105 08/31/2017 1140   HDL 66 08/31/2017 1140   CHOLHDL 2.6 08/31/2010 0540   VLDL 12 08/31/2010 0540   LDLCALC 63 08/31/2017 1140   Hepatic Function Panel     Component Value Date/Time   PROT 7.1 09/29/2018 1052   ALBUMIN 4.8 (H) 09/29/2018 1052   AST 18  09/29/2018 1052   ALT 16 09/29/2018 1052   ALKPHOS 54 09/29/2018 1052   BILITOT 0.3 09/29/2018 1052      Component Value Date/Time   TSH 1.680 08/31/2017 1140   TSH 1.57 03/23/2016 1116   Results for SARIAH, HENKIN (MRN [id number]) as of 03/06/2019 11:26  Ref. Range 09/29/2018 10:52  Vitamin D, 25-Hydroxy  Latest Ref Range: 30.0 - 100.0 ng/mL 35.5    I, Michaelene Song, am acting as Location manager for [employer], DO  I have reviewed the above documentation for accuracy and completeness, and I agree with the above. -Jearld Lesch, DO

## 2019-03-13 ENCOUNTER — Telehealth (INDEPENDENT_AMBULATORY_CARE_PROVIDER_SITE_OTHER): Payer: Medicare HMO | Admitting: Physician Assistant

## 2019-03-15 ENCOUNTER — Ambulatory Visit (INDEPENDENT_AMBULATORY_CARE_PROVIDER_SITE_OTHER): Payer: Medicare HMO | Admitting: Physician Assistant

## 2019-03-26 ENCOUNTER — Other Ambulatory Visit (INDEPENDENT_AMBULATORY_CARE_PROVIDER_SITE_OTHER): Payer: Self-pay | Admitting: Bariatrics

## 2019-03-26 ENCOUNTER — Other Ambulatory Visit (INDEPENDENT_AMBULATORY_CARE_PROVIDER_SITE_OTHER): Payer: Self-pay | Admitting: Family Medicine

## 2019-03-26 DIAGNOSIS — I10 Essential (primary) hypertension: Secondary | ICD-10-CM

## 2019-04-10 ENCOUNTER — Telehealth (INDEPENDENT_AMBULATORY_CARE_PROVIDER_SITE_OTHER): Payer: Medicare HMO | Admitting: Physician Assistant

## 2019-04-10 ENCOUNTER — Encounter (INDEPENDENT_AMBULATORY_CARE_PROVIDER_SITE_OTHER): Payer: Self-pay | Admitting: Physician Assistant

## 2019-04-10 ENCOUNTER — Other Ambulatory Visit: Payer: Self-pay

## 2019-04-10 DIAGNOSIS — E7849 Other hyperlipidemia: Secondary | ICD-10-CM

## 2019-04-10 DIAGNOSIS — Z6837 Body mass index (BMI) 37.0-37.9, adult: Secondary | ICD-10-CM | POA: Diagnosis not present

## 2019-04-12 NOTE — Progress Notes (Signed)
Office: [phone]  /  Fax: [phone] TeleHealth Visit:  Molly [name] has verbally consented to this TeleHealth visit today. The patient is located at home, the provider is located at the [facility] office. The participants in this visit include the listed provider and patient and any and all parties involved. The visit was conducted today via telephone. [facility] was unable to use realtime audiovisual technology today and the telehealth visit was conducted via telephone.  HPI:   Chief Complaint: OBESITY Molly [name] is here to discuss her progress with her obesity treatment plan. She is on the Category 2 plan and is following her eating plan approximately 80 % of the time. She states she is exercising 0 minutes 0 times per week. Molly [name]'s most recent weight was at 195 pounds (04/10/19). She states that she is once again helping take care of her grandbaby and she is trying to get back to a routine. We were unable to weigh the patient today for this TeleHealth visit. She feels as if she has lost weight since her last visit. She has lost 16 lbs since starting treatment with [name].  Hyperlipidemia Molly [name] has hyperlipidemia and she is on Crestor. She has been trying to improve her cholesterol levels with intensive lifestyle modification including a low saturated fat diet, exercise and weight loss. She denies any chest pain.  ASSESSMENT AND PLAN:  Other hyperlipidemia  Class 2 severe obesity with serious comorbidity and body mass index (BMI) of 37.0 to 37.9 in adult, unspecified obesity type (Molly [name])  PLAN:  Hyperlipidemia Molly [name] was informed of the American Heart Association Guidelines emphasizing intensive lifestyle modifications as the first line treatment for hyperlipidemia. We discussed many lifestyle modifications today in depth, and Molly [name] will continue to work on decreasing saturated fats such as fatty red meat, butter and many fried foods. She will also increase vegetables and lean protein in  her diet and continue to work on exercise and weight loss efforts. Molly [name] will continue with Crestor and weight loss and she will follow up as directed.  Obesity Molly [name] is currently in the action stage of change. As such, her goal is to continue with weight loss efforts She has agreed to follow the Category 2 plan Molly [name] has been instructed to work up to a goal of 150 minutes of combined cardio and strengthening exercise per week for weight loss and overall health benefits. We discussed the following Behavioral Modification Strategies today: keeping healthy foods in the home and work on meal planning and easy cooking plans  Molly [name] has agreed to follow up with our clinic in 2 weeks. She was informed of the importance of frequent follow up visits to maximize her success with intensive lifestyle modifications for her multiple health conditions.  ALLERGIES: No Known Allergies  MEDICATIONS: Current Outpatient Medications on File Prior to Visit  Medication Sig Dispense Refill   amLODipine (NORVASC) 10 MG tablet Take 1 tablet (10 mg total) by mouth daily. 30 tablet 0   chlorthalidone (HYGROTON) 25 MG tablet TAKE 1 TABLET BY MOUTH EVERY DAY 30 tablet 0   Cholecalciferol (VITAMIN D3) 5000 units CAPS Take 1 capsule by mouth daily.     co-enzyme Q-10 30 MG capsule Take 30 mg by mouth 3 (three) times daily.     diazepam (VALIUM) 5 MG tablet Take 0.5 tablets (2.5 mg total) by mouth every 8 (eight) hours as needed (vertigo/dizziness). 10 tablet 0   escitalopram (LEXAPRO) 20 MG tablet      fluticasone (FLONASE) 50  MCG/ACT nasal spray Place 2 sprays into both nostrils daily as needed for allergies or rhinitis. 16 g 5   furosemide (LASIX) 20 MG tablet Take 20 mg by mouth as needed.      glucose blood (ACCU-CHEK ACTIVE STRIPS) test strip Use as instructed 100 each 1   irbesartan (AVAPRO) 150 MG tablet Take 1 tablet (150 mg total) by mouth daily. 30 tablet 0   Lancets (ACCU-CHEK SAFE-T PRO) lancets Use as  instructed 100 each 1   levocetirizine (XYZAL) 5 MG tablet Take 1 tablet (5 mg total) by mouth every evening. 30 tablet 5   losartan (COZAAR) 100 MG tablet TAKE 1 TABLET BY MOUTH EVERY DAY 30 tablet 0   metFORMIN (GLUCOPHAGE) 500 MG tablet Take 2 in the AM and 1 in the PM 90 tablet 0   Multiple Vitamins-Minerals (MULTIVITAMIN ADULT PO) Take 1 tablet by mouth daily.     Omega-3 Fatty Acids (FISH OIL PO) Take 1 tablet by mouth daily.     ondansetron (ZOFRAN ODT) 4 MG disintegrating tablet Take 1 tablet (4 mg total) by mouth every 8 (eight) hours as needed for nausea or vomiting. 10 tablet 0   rosuvastatin (CRESTOR) 10 MG tablet Take 1 tablet (10 mg total) by mouth daily. 30 tablet 0   No current facility-administered medications on file prior to visit.     PAST MEDICAL HISTORY: Past Medical History:  Diagnosis Date   Anxiety    Constipation    Depression    Diabetes type 2, controlled (HCC)    Diarrhea    Fatigue    Frequent urination    GERD (gastroesophageal reflux disease)    Hyperlipidemia    Hypertension    Kidney disease    Leg cramps    Multiple food allergies    Muscle stiffness    Seasonal allergies    Shortness of breath    Sinus pain    Swallowing difficulty    Swelling of extremity    Wheezing     PAST SURGICAL HISTORY: Past Surgical History:  Procedure Laterality Date   CHOLECYSTECTOMY  1999   FOOT SURGERY     MASTECTOMY      SOCIAL HISTORY: Social History   Tobacco Use   Smoking status: Former Smoker    Types: Cigarettes    Quit date: 03/23/1998    Years since quitting: 21.0   Smokeless tobacco: Never Used  Substance Use Topics   Alcohol use: Yes    Comment: occasional   Drug use: No    FAMILY HISTORY: Family History  Problem Relation Age of Onset   Allergic rhinitis Sister    Eczema Grandchild    Eczema Daughter    Urticaria Daughter    Lupus Other    Hypertension Mother    Stroke Mother     Obesity Mother    Hypertension Father    Heart disease Father    Sudden death Father    Asthma Neg Hx    Atopy Neg Hx    Angioedema Neg Hx     ROS: Review of Systems  Constitutional: Positive for weight loss.  Cardiovascular: Negative for chest pain.    PHYSICAL EXAM: Pt in no acute distress  RECENT LABS AND TESTS: BMET    Component Value Date/Time   NA 141 09/29/2018 1052   K 3.6 09/29/2018 1052   CL 100 09/29/2018 1052   CO2 25 09/29/2018 1052   GLUCOSE 116 (H) 09/29/2018 1052   GLUCOSE 134 (  H) 09/12/2017 2118   BUN 15 09/29/2018 1052   CREATININE 0.95 09/29/2018 1052   CREATININE 0.90 03/23/2016 1116   CALCIUM 10.5 (H) 09/29/2018 1052   GFRNONAA 60 09/29/2018 1052   GFRAA 69 09/29/2018 1052   Lab Results  Component Value Date   HGBA1C 6.1 (H) 09/29/2018   HGBA1C 5.9 (H) 05/05/2018   HGBA1C 6.0 (H) 12/21/2017   HGBA1C 6.5 (H) 08/31/2017   HGBA1C (H) 08/31/2010    6.0 (NOTE)                                                                       According to the ADA Clinical Practice Recommendations for 2011, when HbA1c is used as a screening test:   >=6.5%   Diagnostic of Diabetes Mellitus           (if abnormal result  is confirmed)  5.7-6.4%   Increased risk of developing Diabetes Mellitus  References:Diagnosis and Classification of Diabetes Mellitus,Diabetes [id number] 1):S62-S69 and Standards of Medical Care in         Diabetes - 2011,Diabetes Care,2011,34  (Suppl 1):S11-S61.   Lab Results  Component Value Date   INSULIN 19.0 09/29/2018   INSULIN 17.2 05/05/2018   INSULIN 17.7 12/21/2017   INSULIN 20.4 08/31/2017   CBC    Component Value Date/Time   WBC 4.4 09/12/2017 2118   RBC 4.72 09/12/2017 2118   HGB 15.0 09/12/2017 2118   HGB 14.0 08/31/2017 1140   HCT 43.7 09/12/2017 2118   HCT 41.2 08/31/2017 1140   PLT 173 09/12/2017 2118   MCV 92.6 09/12/2017 2118   MCV 92 08/31/2017 1140   MCH 31.8 09/12/2017 2118   MCHC 34.3 09/12/2017  2118   RDW 13.1 09/12/2017 2118   RDW 13.7 08/31/2017 1140   LYMPHSABS 1.7 08/31/2017 1140   MONOABS 324 03/23/2016 1116   EOSABS 0.1 08/31/2017 1140   BASOSABS 0.0 08/31/2017 1140   Iron/TIBC/Ferritin/ %Sat No results found for: IRON, TIBC, FERRITIN, IRONPCTSAT Lipid Panel     Component Value Date/Time   CHOL 150 08/31/2017 1140   TRIG 105 08/31/2017 1140   HDL 66 08/31/2017 1140   CHOLHDL 2.6 08/31/2010 0540   VLDL 12 08/31/2010 0540   LDLCALC 63 08/31/2017 1140   Hepatic Function Panel     Component Value Date/Time   PROT 7.1 09/29/2018 1052   ALBUMIN 4.8 (H) 09/29/2018 1052   AST 18 09/29/2018 1052   ALT 16 09/29/2018 1052   ALKPHOS 54 09/29/2018 1052   BILITOT 0.3 09/29/2018 1052      Component Value Date/Time   TSH 1.680 08/31/2017 1140   TSH 1.57 03/23/2016 1116     Ref. Range 09/29/2018 10:52  Vitamin D, 25-Hydroxy Latest Ref Range: 30.0 - 100.0 ng/mL 35.5    I, Doreene Nest, am acting as Location manager for Abby Potash, PA-C I, Abby Potash, PA-C have reviewed above note and agree with its content

## 2019-04-22 ENCOUNTER — Other Ambulatory Visit (INDEPENDENT_AMBULATORY_CARE_PROVIDER_SITE_OTHER): Payer: Self-pay | Admitting: Bariatrics

## 2019-04-22 DIAGNOSIS — I10 Essential (primary) hypertension: Secondary | ICD-10-CM

## 2019-05-01 ENCOUNTER — Telehealth (INDEPENDENT_AMBULATORY_CARE_PROVIDER_SITE_OTHER): Payer: Medicare HMO | Admitting: Physician Assistant

## 2019-05-01 ENCOUNTER — Other Ambulatory Visit: Payer: Self-pay

## 2019-05-01 ENCOUNTER — Encounter (INDEPENDENT_AMBULATORY_CARE_PROVIDER_SITE_OTHER): Payer: Self-pay | Admitting: Physician Assistant

## 2019-05-01 DIAGNOSIS — E119 Type 2 diabetes mellitus without complications: Secondary | ICD-10-CM | POA: Diagnosis not present

## 2019-05-01 DIAGNOSIS — I10 Essential (primary) hypertension: Secondary | ICD-10-CM

## 2019-05-01 DIAGNOSIS — Z6836 Body mass index (BMI) 36.0-36.9, adult: Secondary | ICD-10-CM | POA: Diagnosis not present

## 2019-05-01 MED ORDER — CHLORTHALIDONE 25 MG PO TABS: 25.0000 mg | ORAL_TABLET | Freq: Every day | ORAL | 0 refills | Status: DC

## 2019-05-01 MED ORDER — LOSARTAN POTASSIUM 100 MG PO TABS: 100.0000 mg | ORAL_TABLET | Freq: Every day | ORAL | 0 refills | Status: DC

## 2019-05-02 NOTE — Progress Notes (Signed)
Office: [phone]  /  Fax: [phone] TeleHealth Visit:  Molly [name] has verbally consented to this TeleHealth visit today. The patient is located at home, the provider is located at the [facility] office. The participants in this visit include the listed provider and patient and any and all parties involved. The visit was conducted today via telephone. Molly [name] was unable to use realtime audiovisual technology today and the telehealth visit was conducted via telephone.  HPI:   Chief Complaint: OBESITY Molly [name] is here to discuss her progress with her obesity treatment plan. She is on the Category 2 plan and is following her eating plan approximately 80 % of the time. She states she is doing resistance bands 10 to 15 minutes daily. Molly [name]'s most recent weight is 197 pounds (05/01/19). Patient reports that she has had a difficult time getting in all of her protein. We were unable to weigh the patient today for this TeleHealth visit. She feels as if she has maintained weight since her last visit. She has lost 16 lbs since starting treatment with [name].  Hypertension Molly [name] is a 73 y.o. female with hypertension. Her blood pressure is normal. Molly [name] is on Losartan, Chlorthalidone, Avapro, Furosemide and Amlodipine. Molly [name] denies chest pain or shortness of breath on exertion. She is working weight loss to help control her blood pressure with the goal of decreasing her risk of heart attack and stroke. Molly [name] blood pressure is currently controlled.  Diabetes II Molly [name] has a diagnosis of diabetes type II. Molly [name] is on Metformin and she denies nausea, vomiting or diarrhea. Molly [name]  any hypoglycemic episodes. She has been working on intensive lifestyle modifications including diet, exercise, and weight loss to help control her blood glucose levels.  ASSESSMENT AND PLAN:  Essential hypertension - Plan: chlorthalidone (HYGROTON) 25 MG tablet, losartan (COZAAR) 100 MG tablet  Type 2 diabetes  mellitus without complication, without long-term current use of insulin (HCC)  Class 2 severe obesity with serious comorbidity and body mass index (BMI) of 36.0 to 36.9 in adult, unspecified obesity type ([place])  PLAN:  Hypertension We discussed sodium restriction, working on healthy weight loss, and a regular exercise program as the means to achieve improved blood pressure control. Molly [name] agreed with this plan and agreed to follow up as directed. We will continue to monitor her blood pressure as well as her progress with the above lifestyle modifications. Molly [name] agrees to continue Losartan 100 mg daily #30 with no refills and Chlorthalidone 25 mg daily #30 with no refills and will watch for signs of hypotension as she continues her lifestyle modifications.  Diabetes II Molly [name] has been given extensive diabetes education by myself today including ideal fasting and post-prandial blood glucose readings, individual ideal Hgb A1c goals and hypoglycemia prevention. We discussed the importance of good blood sugar control to decrease the likelihood of diabetic complications such as nephropathy, neuropathy, limb loss, blindness, coronary artery disease, and death. We discussed the importance of intensive lifestyle modification including diet, exercise and weight loss as the first line treatment for diabetes. Molly [name] agrees to continue with medications and weight loss and follow up at the agreed upon time.  Obesity Molly [name] is currently in the action stage of change. As such, her goal is to continue with weight loss efforts She has agreed to follow the Category 2 plan Molly [name] has been instructed to work up to a goal of 150 minutes of combined cardio and strengthening exercise per week for weight loss and overall health  benefits. We discussed the following Behavioral Modification Strategies today: keeping healthy foods in the home and work on meal planning and easy cooking plans  Molly [name] has agreed to follow up with our clinic  in 2 weeks. She was informed of the importance of frequent follow up visits to maximize her success with intensive lifestyle modifications for her multiple health conditions.  ALLERGIES: No Known Allergies  MEDICATIONS: Current Outpatient Medications on File Prior to Visit  Medication Sig Dispense Refill   amLODipine (NORVASC) 10 MG tablet Take 1 tablet (10 mg total) by mouth daily. 30 tablet 0   Cholecalciferol (VITAMIN D3) 5000 units CAPS Take 1 capsule by mouth daily.     co-enzyme Q-10 30 MG capsule Take 30 mg by mouth 3 (three) times daily.     diazepam (VALIUM) 5 MG tablet Take 0.5 tablets (2.5 mg total) by mouth every 8 (eight) hours as needed (vertigo/dizziness). 10 tablet 0   escitalopram (LEXAPRO) 20 MG tablet      fluticasone (FLONASE) 50 MCG/ACT nasal spray Place 2 sprays into both nostrils daily as needed for allergies or rhinitis. 16 g 5   furosemide (LASIX) 20 MG tablet Take 20 mg by mouth as needed.      glucose blood (ACCU-CHEK ACTIVE STRIPS) test strip Use as instructed 100 each 1   irbesartan (AVAPRO) 150 MG tablet Take 1 tablet (150 mg total) by mouth daily. 30 tablet 0   Lancets (ACCU-CHEK SAFE-T PRO) lancets Use as instructed 100 each 1   levocetirizine (XYZAL) 5 MG tablet Take 1 tablet (5 mg total) by mouth every evening. 30 tablet 5   metFORMIN (GLUCOPHAGE) 500 MG tablet Take 2 in the AM and 1 in the PM 90 tablet 0   Multiple Vitamins-Minerals (MULTIVITAMIN ADULT PO) Take 1 tablet by mouth daily.     Omega-3 Fatty Acids (FISH OIL PO) Take 1 tablet by mouth daily.     ondansetron (ZOFRAN ODT) 4 MG disintegrating tablet Take 1 tablet (4 mg total) by mouth every 8 (eight) hours as needed for nausea or vomiting. 10 tablet 0   rosuvastatin (CRESTOR) 10 MG tablet Take 1 tablet (10 mg total) by mouth daily. 30 tablet 0   No current facility-administered medications on file prior to visit.     PAST MEDICAL HISTORY: Past Medical History:  Diagnosis Date     Anxiety    Constipation    Depression    Diabetes type 2, controlled (HCC)    Diarrhea    Fatigue    Frequent urination    GERD (gastroesophageal reflux disease)    Hyperlipidemia    Hypertension    Kidney disease    Leg cramps    Multiple food allergies    Muscle stiffness    Seasonal allergies    Shortness of breath    Sinus pain    Swallowing difficulty    Swelling of extremity    Wheezing     PAST SURGICAL HISTORY: Past Surgical History:  Procedure Laterality Date   CHOLECYSTECTOMY  1999   FOOT SURGERY     MASTECTOMY      SOCIAL HISTORY: Social History   Tobacco Use   Smoking status: Former Smoker    Types: Cigarettes    Quit date: 03/23/1998    Years since quitting: 21.1   Smokeless tobacco: Never Used  Substance Use Topics   Alcohol use: Yes    Comment: occasional   Drug use: No    FAMILY HISTORY: Family History  Problem Relation Age of Onset   Allergic rhinitis Sister    Eczema Grandchild    Eczema Daughter    Urticaria Daughter    Lupus Other    Hypertension Mother    Stroke Mother    Obesity Mother    Hypertension Father    Heart disease Father    Sudden death Father    Asthma Neg Hx    Atopy Neg Hx    Angioedema Neg Hx     ROS: Review of Systems  Constitutional: Negative for weight loss.  Respiratory: Negative for shortness of breath (on exertion).   Cardiovascular: Negative for chest pain.  Gastrointestinal: Negative for diarrhea, nausea and vomiting.  Endo/Heme/Allergies:       Negative for hypoglycemia    PHYSICAL EXAM: Pt in no acute distress  RECENT LABS AND TESTS: BMET    Component Value Date/Time   NA 141 09/29/2018 1052   K 3.6 09/29/2018 1052   CL 100 09/29/2018 1052   CO2 25 09/29/2018 1052   GLUCOSE 116 (H) 09/29/2018 1052   GLUCOSE 134 (H) 09/12/2017 2118   BUN 15 09/29/2018 1052   CREATININE 0.95 09/29/2018 1052   CREATININE 0.90 03/23/2016 1116   CALCIUM 10.5  (H) 09/29/2018 1052   GFRNONAA 60 09/29/2018 1052   GFRAA 69 09/29/2018 1052   Lab Results  Component Value Date   HGBA1C 6.1 (H) 09/29/2018   HGBA1C 5.9 (H) 05/05/2018   HGBA1C 6.0 (H) 12/21/2017   HGBA1C 6.5 (H) 08/31/2017   HGBA1C (H) 08/31/2010    6.0 (NOTE)                                                                       According to the ADA Clinical Practice Recommendations for 2011, when HbA1c is used as a screening test:   >=6.5%   Diagnostic of Diabetes Mellitus           (if abnormal result  is confirmed)  5.7-6.4%   Increased risk of developing Diabetes Mellitus  References:Diagnosis and Classification of Diabetes Mellitus,Diabetes [id number] 1):S62-S69 and Standards of Medical Care in         Diabetes - 2011,Diabetes Care,2011,34  (Suppl 1):S11-S61.   Lab Results  Component Value Date   INSULIN 19.0 09/29/2018   INSULIN 17.2 05/05/2018   INSULIN 17.7 12/21/2017   INSULIN 20.4 08/31/2017   CBC    Component Value Date/Time   WBC 4.4 09/12/2017 2118   RBC 4.72 09/12/2017 2118   HGB 15.0 09/12/2017 2118   HGB 14.0 08/31/2017 1140   HCT 43.7 09/12/2017 2118   HCT 41.2 08/31/2017 1140   PLT 173 09/12/2017 2118   MCV 92.6 09/12/2017 2118   MCV 92 08/31/2017 1140   MCH 31.8 09/12/2017 2118   MCHC 34.3 09/12/2017 2118   RDW 13.1 09/12/2017 2118   RDW 13.7 08/31/2017 1140   LYMPHSABS 1.7 08/31/2017 1140   MONOABS 324 03/23/2016 1116   EOSABS 0.1 08/31/2017 1140   BASOSABS 0.0 08/31/2017 1140   Iron/TIBC/Ferritin/ %Sat No results found for: IRON, TIBC, FERRITIN, IRONPCTSAT Lipid Panel     Component Value Date/Time   CHOL 150 08/31/2017 1140   TRIG 105 08/31/2017 1140   HDL 66 08/31/2017 1140  CHOLHDL 2.6 08/31/2010 0540   VLDL 12 08/31/2010 0540   LDLCALC 63 08/31/2017 1140   Hepatic Function Panel     Component Value Date/Time   PROT 7.1 09/29/2018 1052   ALBUMIN 4.8 (H) 09/29/2018 1052   AST 18 09/29/2018 1052   ALT 16 09/29/2018 1052    ALKPHOS 54 09/29/2018 1052   BILITOT 0.3 09/29/2018 1052      Component Value Date/Time   TSH 1.680 08/31/2017 1140   TSH 1.57 03/23/2016 1116     Ref. Range 09/29/2018 10:52  Vitamin D, 25-Hydroxy Latest Ref Range: 30.0 - 100.0 ng/mL 35.5    I, Doreene Nest, am acting as Location manager for Abby Potash, PA-C I, Abby Potash, PA-C have reviewed above note and agree with its content

## 2019-05-15 ENCOUNTER — Ambulatory Visit (INDEPENDENT_AMBULATORY_CARE_PROVIDER_SITE_OTHER): Payer: Medicare HMO | Admitting: Physician Assistant

## 2019-05-15 ENCOUNTER — Encounter (INDEPENDENT_AMBULATORY_CARE_PROVIDER_SITE_OTHER): Payer: Self-pay | Admitting: Physician Assistant

## 2019-05-15 ENCOUNTER — Other Ambulatory Visit: Payer: Self-pay

## 2019-05-15 DIAGNOSIS — Z6836 Body mass index (BMI) 36.0-36.9, adult: Secondary | ICD-10-CM

## 2019-05-15 DIAGNOSIS — E119 Type 2 diabetes mellitus without complications: Secondary | ICD-10-CM | POA: Diagnosis not present

## 2019-05-17 NOTE — Progress Notes (Signed)
Office: [phone]  /  Fax: [phone] TeleHealth Visit:  Molly [name] has verbally consented to this TeleHealth visit today. The patient is located at home, the provider is located at the [facility] office. The participants in this visit include the listed provider and patient and any and all parties involved. The visit was conducted today via telephone. Molly [name] was unable to use realtime audiovisual technology today and the telehealth visit was conducted via telephone.  HPI:   Chief Complaint: OBESITY Molly [name] is here to discuss her progress with her obesity treatment plan. She is on the Category 2 plan and is following her eating plan approximately 80 % of the time. She states she is exercising 0 minutes 0 times per week. Molly [name]'s most recent weight is 197 pounds. She reports that she is keeping her grandson, and so she is busy all day. She sometimes does not get all of her protein in. We were unable to weigh the patient today for this TeleHealth visit. She feels as if she has maintained weight since her last visit (weight 197 lbs 05/15/19). She has lost 16 lbs since starting treatment with [name].  Diabetes II without complications, without long term use of insulin Molly [name] has a diagnosis of diabetes type II. Molly [name] states fasting BGs range between 98 and 103 and she denies any hypoglycemic episodes. Molly [name] is on metformin and she denies nausea, vomiting or diarrhea. She has been working on intensive lifestyle modifications including diet, exercise, and weight loss to help control her blood glucose levels.  ASSESSMENT AND PLAN:  Type 2 diabetes mellitus without complication, without long-term current use of insulin (HCC)  Class 2 severe obesity with serious comorbidity and body mass index (BMI) of 36.0 to 36.9 in adult, unspecified obesity type ([place])  PLAN:  Diabetes II without complications, without long term use of insulin Molly [name] has been given extensive diabetes education by myself  today including ideal fasting and post-prandial blood glucose readings, individual ideal Hgb A1c goals and hypoglycemia prevention. We discussed the importance of good blood sugar control to decrease the likelihood of diabetic complications such as nephropathy, neuropathy, limb loss, blindness, coronary artery disease, and death. We discussed the importance of intensive lifestyle modification including diet, exercise and weight loss as the first line treatment for diabetes. Molly [name] agrees to continue her diabetes medications and will follow up at the agreed upon time.  Obesity Molly [name] is currently in the action stage of change. As such, her goal is to continue with weight loss efforts She has agreed to follow the Category 2 plan Molly [name] has been instructed to work up to a goal of 150 minutes of combined cardio and strengthening exercise per week for weight loss and overall health benefits. We discussed the following Behavioral Modification Strategies today: keeping healthy foods in the home and work on meal planning and easy cooking plans  Molly [name] has agreed to follow up with our clinic in 3 weeks. She was informed of the importance of frequent follow up visits to maximize her success with intensive lifestyle modifications for her multiple health conditions.  ALLERGIES: No Known Allergies  MEDICATIONS: Current Outpatient Medications on File Prior to Visit  Medication Sig Dispense Refill   amLODipine (NORVASC) 10 MG tablet Take 1 tablet (10 mg total) by mouth daily. 30 tablet 0   chlorthalidone (HYGROTON) 25 MG tablet Take 1 tablet (25 mg total) by mouth daily. 30 tablet 0   Cholecalciferol (VITAMIN D3) 5000 units CAPS Take 1 capsule by mouth  daily.     co-enzyme Q-10 30 MG capsule Take 30 mg by mouth 3 (three) times daily.     diazepam (VALIUM) 5 MG tablet Take 0.5 tablets (2.5 mg total) by mouth every 8 (eight) hours as needed (vertigo/dizziness). 10 tablet 0   escitalopram (LEXAPRO) 20 MG tablet       fluticasone (FLONASE) 50 MCG/ACT nasal spray Place 2 sprays into both nostrils daily as needed for allergies or rhinitis. 16 g 5   furosemide (LASIX) 20 MG tablet Take 20 mg by mouth as needed.      glucose blood (ACCU-CHEK ACTIVE STRIPS) test strip Use as instructed 100 each 1   irbesartan (AVAPRO) 150 MG tablet Take 1 tablet (150 mg total) by mouth daily. 30 tablet 0   Lancets (ACCU-CHEK SAFE-T PRO) lancets Use as instructed 100 each 1   levocetirizine (XYZAL) 5 MG tablet Take 1 tablet (5 mg total) by mouth every evening. 30 tablet 5   losartan (COZAAR) 100 MG tablet Take 1 tablet (100 mg total) by mouth daily. 30 tablet 0   metFORMIN (GLUCOPHAGE) 500 MG tablet Take 2 in the AM and 1 in the PM 90 tablet 0   Multiple Vitamins-Minerals (MULTIVITAMIN ADULT PO) Take 1 tablet by mouth daily.     Omega-3 Fatty Acids (FISH OIL PO) Take 1 tablet by mouth daily.     ondansetron (ZOFRAN ODT) 4 MG disintegrating tablet Take 1 tablet (4 mg total) by mouth every 8 (eight) hours as needed for nausea or vomiting. 10 tablet 0   rosuvastatin (CRESTOR) 10 MG tablet Take 1 tablet (10 mg total) by mouth daily. 30 tablet 0   No current facility-administered medications on file prior to visit.     PAST MEDICAL HISTORY: Past Medical History:  Diagnosis Date   Anxiety    Constipation    Depression    Diabetes type 2, controlled (HCC)    Diarrhea    Fatigue    Frequent urination    GERD (gastroesophageal reflux disease)    Hyperlipidemia    Hypertension    Kidney disease    Leg cramps    Multiple food allergies    Muscle stiffness    Seasonal allergies    Shortness of breath    Sinus pain    Swallowing difficulty    Swelling of extremity    Wheezing     PAST SURGICAL HISTORY: Past Surgical History:  Procedure Laterality Date   CHOLECYSTECTOMY  1999   FOOT SURGERY     MASTECTOMY      SOCIAL HISTORY: Social History   Tobacco Use   Smoking status:  Former Smoker    Types: Cigarettes    Quit date: 03/23/1998    Years since quitting: 21.1   Smokeless tobacco: Never Used  Substance Use Topics   Alcohol use: Yes    Comment: occasional   Drug use: No    FAMILY HISTORY: Family History  Problem Relation Age of Onset   Allergic rhinitis Sister    Eczema Grandchild    Eczema Daughter    Urticaria Daughter    Lupus Other    Hypertension Mother    Stroke Mother    Obesity Mother    Hypertension Father    Heart disease Father    Sudden death Father    Asthma Neg Hx    Atopy Neg Hx    Angioedema Neg Hx     ROS: Review of Systems  Constitutional: Negative for weight loss.  Gastrointestinal: Negative for diarrhea, nausea and vomiting.  Endo/Heme/Allergies:       Negative for hypoglycemia    PHYSICAL EXAM: Pt in no acute distress  RECENT LABS AND TESTS: BMET    Component Value Date/Time   NA 141 09/29/2018 1052   K 3.6 09/29/2018 1052   CL 100 09/29/2018 1052   CO2 25 09/29/2018 1052   GLUCOSE 116 (H) 09/29/2018 1052   GLUCOSE 134 (H) 09/12/2017 2118   BUN 15 09/29/2018 1052   CREATININE 0.95 09/29/2018 1052   CREATININE 0.90 03/23/2016 1116   CALCIUM 10.5 (H) 09/29/2018 1052   GFRNONAA 60 09/29/2018 1052   GFRAA 69 09/29/2018 1052   Lab Results  Component Value Date   HGBA1C 6.1 (H) 09/29/2018   HGBA1C 5.9 (H) 05/05/2018   HGBA1C 6.0 (H) 12/21/2017   HGBA1C 6.5 (H) 08/31/2017   HGBA1C (H) 08/31/2010    6.0 (NOTE)                                                                       According to the ADA Clinical Practice Recommendations for 2011, when HbA1c is used as a screening test:   >=6.5%   Diagnostic of Diabetes Mellitus           (if abnormal result  is confirmed)  5.7-6.4%   Increased risk of developing Diabetes Mellitus  References:Diagnosis and Classification of Diabetes Mellitus,Diabetes [id number] 1):S62-S69 and Standards of Medical Care in         Diabetes - 2011,Diabetes  Care,2011,34  (Suppl 1):S11-S61.   Lab Results  Component Value Date   INSULIN 19.0 09/29/2018   INSULIN 17.2 05/05/2018   INSULIN 17.7 12/21/2017   INSULIN 20.4 08/31/2017   CBC    Component Value Date/Time   WBC 4.4 09/12/2017 2118   RBC 4.72 09/12/2017 2118   HGB 15.0 09/12/2017 2118   HGB 14.0 08/31/2017 1140   HCT 43.7 09/12/2017 2118   HCT 41.2 08/31/2017 1140   PLT 173 09/12/2017 2118   MCV 92.6 09/12/2017 2118   MCV 92 08/31/2017 1140   MCH 31.8 09/12/2017 2118   MCHC 34.3 09/12/2017 2118   RDW 13.1 09/12/2017 2118   RDW 13.7 08/31/2017 1140   LYMPHSABS 1.7 08/31/2017 1140   MONOABS 324 03/23/2016 1116   EOSABS 0.1 08/31/2017 1140   BASOSABS 0.0 08/31/2017 1140   Iron/TIBC/Ferritin/ %Sat No results found for: IRON, TIBC, FERRITIN, IRONPCTSAT Lipid Panel     Component Value Date/Time   CHOL 150 08/31/2017 1140   TRIG 105 08/31/2017 1140   HDL 66 08/31/2017 1140   CHOLHDL 2.6 08/31/2010 0540   VLDL 12 08/31/2010 0540   LDLCALC 63 08/31/2017 1140   Hepatic Function Panel     Component Value Date/Time   PROT 7.1 09/29/2018 1052   ALBUMIN 4.8 (H) 09/29/2018 1052   AST 18 09/29/2018 1052   ALT 16 09/29/2018 1052   ALKPHOS 54 09/29/2018 1052   BILITOT 0.3 09/29/2018 1052      Component Value Date/Time   TSH 1.680 08/31/2017 1140   TSH 1.57 03/23/2016 1116     Ref. Range 09/29/2018 10:52  Vitamin D, 25-Hydroxy Latest Ref Range: 30.0 - 100.0 ng/mL 35.5    I, Mechele Claude  Valere Dross, am acting as Location manager for Abby Potash, PA-C I, Abby Potash, PA-C have reviewed above note and agree with its content

## 2019-05-26 ENCOUNTER — Other Ambulatory Visit (INDEPENDENT_AMBULATORY_CARE_PROVIDER_SITE_OTHER): Payer: Self-pay | Admitting: Physician Assistant

## 2019-05-26 DIAGNOSIS — I10 Essential (primary) hypertension: Secondary | ICD-10-CM

## 2019-06-05 ENCOUNTER — Other Ambulatory Visit: Payer: Self-pay

## 2019-06-05 ENCOUNTER — Ambulatory Visit (INDEPENDENT_AMBULATORY_CARE_PROVIDER_SITE_OTHER): Payer: Medicare HMO | Admitting: Physician Assistant

## 2019-06-05 ENCOUNTER — Encounter (INDEPENDENT_AMBULATORY_CARE_PROVIDER_SITE_OTHER): Payer: Self-pay | Admitting: Physician Assistant

## 2019-06-05 VITALS — BP 151/87 | HR 91 | Temp 98.1°F | Ht 63.0 in | Wt 205.0 lb

## 2019-06-05 DIAGNOSIS — I10 Essential (primary) hypertension: Secondary | ICD-10-CM | POA: Diagnosis not present

## 2019-06-05 DIAGNOSIS — Z6836 Body mass index (BMI) 36.0-36.9, adult: Secondary | ICD-10-CM | POA: Diagnosis not present

## 2019-06-05 DIAGNOSIS — E559 Vitamin D deficiency, unspecified: Secondary | ICD-10-CM | POA: Diagnosis not present

## 2019-06-05 DIAGNOSIS — E7849 Other hyperlipidemia: Secondary | ICD-10-CM | POA: Diagnosis not present

## 2019-06-05 DIAGNOSIS — E119 Type 2 diabetes mellitus without complications: Secondary | ICD-10-CM | POA: Diagnosis not present

## 2019-06-05 NOTE — Progress Notes (Signed)
Office: [phone]  /  Fax: [phone]   HPI:   Chief Complaint: OBESITY Molly [name] is here to discuss her progress with her obesity treatment plan. She is on the Category 2 plan and is following her eating plan approximately 50 % of the time. She states she is exercising 0 minutes 0 times per week. Molly [name] reports that she continues to have a difficult time getting in all of her food. When she doesn't meal plan, she gets so hungry that she reaches for anything. Her weight is 205 lb (93 kg) today and has had a weight loss of 2 pounds since her last in-office visit. She has lost 18 lbs since starting treatment with [name].  Diabetes II Molly [name] has a diagnosis of diabetes type II. Molly [name] states fasting BGs range between 80 and 120 and she denies any hypoglycemic episodes. Molly [name] is on metformin and she denies nausea, vomiting or diarrhea. She has been working on intensive lifestyle modifications including diet, exercise, and weight loss to help control her blood glucose levels.  Hyperlipidemia Molly [name] has hyperlipidemia and she is on Crestor. She has been trying to improve her cholesterol levels with intensive lifestyle modification including a low saturated fat diet, exercise and weight loss. She denies any chest pain.  Vitamin D deficiency Molly [name] has a diagnosis of vitamin D deficiency. Molly [name] is currently taking OTC vit D and she denies nausea, vomiting or muscle weakness.  Hypertension Molly [name] is a 73 y.o. female with hypertension. Patient did not take her blood pressure medications this morning. She is on Amlodipine, Chlorthalidone, Furosemide, Losartan and Irbesartan. Molly [name] denies chest pain or shortness of breath on exertion. She is working weight loss to help control her blood pressure with the goal of decreasing her risk of heart attack and stroke. Molly [name] blood pressure is currently controlled.  ASSESSMENT AND PLAN:  Type 2 diabetes mellitus without complication, without long-term current use of  insulin (Molly [name]) - Plan: Comprehensive metabolic panel, Hemoglobin A1c, Insulin, random  Other hyperlipidemia - Plan: Lipid Panel With LDL/HDL Ratio  Vitamin D deficiency - Plan: Vitamin D (25 hydroxy)  Essential hypertension  Class 2 severe obesity with serious comorbidity and body mass index (BMI) of 36.0 to 36.9 in adult, unspecified obesity type ([place])  PLAN:  Diabetes II Molly [name] has been given extensive diabetes education by myself today including ideal fasting and post-prandial blood glucose readings, individual ideal Hgb A1c goals  and hypoglycemia prevention. We discussed the importance of good blood sugar control to decrease the likelihood of diabetic complications such as nephropathy, neuropathy, limb loss, blindness, coronary artery disease, and death. We discussed the importance of intensive lifestyle modification including diet, exercise and weight loss as the first line treatment for diabetes. Irelynd agrees to continue her diabetes medications and follow up at the agreed upon time.  Hyperlipidemia Molly [name] was informed of the American Heart Association Guidelines emphasizing intensive lifestyle modifications as the first line treatment for hyperlipidemia. We discussed many lifestyle modifications today in depth, and Zeriyah will continue to work on decreasing saturated fats such as fatty red meat, butter and many fried foods. She will also increase vegetables and lean protein in her diet and continue to work on exercise and weight loss efforts. Keyannah will continue with Crestor and follow up as directed.  Vitamin D Deficiency Molly [name] was informed that low vitamin D levels contributes to fatigue and are associated with obesity, breast, and colon cancer. Molly [name] will continue to take OTC vitamin D and she will follow  up for routine testing of vitamin D, at least 2-3 times per year. She was informed of the risk of over-replacement of vitamin D and agrees to not increase her dose unless she discusses this with  [name] first. We will check labs and Molly [name] will follow up as directed.  Hypertension We discussed sodium restriction, working on healthy weight loss, and a regular exercise program as the means to achieve improved blood pressure control. Molly [name] agreed with this plan and agreed to follow up as directed. We will continue to monitor her blood pressure as well as her progress with the above lifestyle modifications. She will continue her medications as prescribed and will watch for signs of hypotension as she continues her lifestyle modifications.  Obesity Molly [name] is currently in the action stage of change. As such, her goal is to continue with weight loss efforts She has agreed to change to the Pescatarian eating plan Molly [name] has been instructed to work up to a goal of 150 minutes of combined cardio and strengthening exercise per week for weight loss and overall health benefits. We discussed the following Behavioral Modification Strategies today: no skipping meals and work on meal planning and easy cooking plans  Molly [name] has agreed to follow up with our clinic in 3 weeks. She was informed of the importance of frequent follow up visits to maximize her success with intensive lifestyle modifications for her multiple health conditions.  ALLERGIES: No Known Allergies  MEDICATIONS: Current Outpatient Medications on File Prior to Visit  Medication Sig Dispense Refill   amLODipine (NORVASC) 10 MG tablet Take 1 tablet (10 mg total) by mouth daily. 30 tablet 0   chlorthalidone (HYGROTON) 25 MG tablet TAKE 1 TABLET BY MOUTH EVERY DAY 30 tablet 0   Cholecalciferol (VITAMIN D3) 5000 units CAPS Take 1 capsule by mouth daily.     co-enzyme Q-10 30 MG capsule Take 30 mg by mouth 3 (three) times daily.     diazepam (VALIUM) 5 MG tablet Take 0.5 tablets (2.5 mg total) by mouth every 8 (eight) hours as needed (vertigo/dizziness). 10 tablet 0   escitalopram (LEXAPRO) 20 MG tablet      fluticasone (FLONASE) 50 MCG/ACT nasal  spray Place 2 sprays into both nostrils daily as needed for allergies or rhinitis. 16 g 5   furosemide (LASIX) 20 MG tablet Take 20 mg by mouth as needed.      glucose blood (ACCU-CHEK ACTIVE STRIPS) test strip Use as instructed 100 each 1   irbesartan (AVAPRO) 150 MG tablet Take 1 tablet (150 mg total) by mouth daily. 30 tablet 0   Lancets (ACCU-CHEK SAFE-T PRO) lancets Use as instructed 100 each 1   levocetirizine (XYZAL) 5 MG tablet Take 1 tablet (5 mg total) by mouth every evening. 30 tablet 5   losartan (COZAAR) 100 MG tablet TAKE 1 TABLET BY MOUTH EVERY DAY 30 tablet 0   metFORMIN (GLUCOPHAGE) 500 MG tablet Take 2 in the AM and 1 in the PM 90 tablet 0   Multiple Vitamins-Minerals (MULTIVITAMIN ADULT PO) Take 1 tablet by mouth daily.     Omega-3 Fatty Acids (FISH OIL PO) Take 1 tablet by mouth daily.     ondansetron (ZOFRAN ODT) 4 MG disintegrating tablet Take 1 tablet (4 mg total) by mouth every 8 (eight) hours as needed for nausea or vomiting. 10 tablet 0   rosuvastatin (CRESTOR) 10 MG tablet Take 1 tablet (10 mg total) by mouth daily. 30 tablet 0   No current facility-administered medications on  file prior to visit.     PAST MEDICAL HISTORY: Past Medical History:  Diagnosis Date   Anxiety    Constipation    Depression    Diabetes type 2, controlled (HCC)    Diarrhea    Fatigue    Frequent urination    GERD (gastroesophageal reflux disease)    Hyperlipidemia    Hypertension    Kidney disease    Leg cramps    Multiple food allergies    Muscle stiffness    Seasonal allergies    Shortness of breath    Sinus pain    Swallowing difficulty    Swelling of extremity    Wheezing     PAST SURGICAL HISTORY: Past Surgical History:  Procedure Laterality Date   CHOLECYSTECTOMY  1999   FOOT SURGERY     MASTECTOMY      SOCIAL HISTORY: Social History   Tobacco Use   Smoking status: Former Smoker    Types: Cigarettes    Quit date:  03/23/1998    Years since quitting: 21.2   Smokeless tobacco: Never Used  Substance Use Topics   Alcohol use: Yes    Comment: occasional   Drug use: No    FAMILY HISTORY: Family History  Problem Relation Age of Onset   Allergic rhinitis Sister    Eczema Grandchild    Eczema Daughter    Urticaria Daughter    Lupus Other    Hypertension Mother    Stroke Mother    Obesity Mother    Hypertension Father    Heart disease Father    Sudden death Father    Asthma Neg Hx    Atopy Neg Hx    Angioedema Neg Hx     ROS: Review of Systems  Constitutional: Positive for weight loss.  Cardiovascular: Negative for chest pain.  Gastrointestinal: Negative for diarrhea, nausea and vomiting.  Musculoskeletal:       Negative for muscle weakness  Endo/Heme/Allergies:       Negative for hypoglycemia    PHYSICAL EXAM: Blood pressure (!) 151/87, pulse 91, temperature 98.1 F (36.7 C), temperature source Oral, height 5\' 3"  (1.6 m), weight 205 lb (93 kg), SpO2 97 %. Body mass index is 36.31 kg/m. Physical Exam Vitals signs reviewed.  Constitutional:      Appearance: Normal appearance. She is well-developed. She is obese.  Cardiovascular:     Rate and Rhythm: Normal rate.  Pulmonary:     Effort: Pulmonary effort is normal.  Musculoskeletal: Normal range of motion.  Skin:    General: Skin is warm and dry.  Neurological:     Mental Status: She is alert and oriented to person, place, and time.  Psychiatric:        Mood and Affect: Mood normal.        Behavior: Behavior normal.     RECENT LABS AND TESTS: BMET    Component Value Date/Time   NA 141 09/29/2018 1052   K 3.6 09/29/2018 1052   CL 100 09/29/2018 1052   CO2 25 09/29/2018 1052   GLUCOSE 116 (H) 09/29/2018 1052   GLUCOSE 134 (H) 09/12/2017 2118   BUN 15 09/29/2018 1052   CREATININE 0.95 09/29/2018 1052   CREATININE 0.90 03/23/2016 1116   CALCIUM 10.5 (H) 09/29/2018 1052   GFRNONAA 60 09/29/2018 1052    GFRAA 69 09/29/2018 1052   Lab Results  Component Value Date   HGBA1C 6.1 (H) 09/29/2018   HGBA1C 5.9 (H) 05/05/2018   HGBA1C  6.0 (H) 12/21/2017   HGBA1C 6.5 (H) 08/31/2017   HGBA1C (H) 08/31/2010    6.0 (NOTE)                                                                       According to the ADA Clinical Practice Recommendations for 2011, when HbA1c is used as a screening test:   >=6.5%   Diagnostic of Diabetes Mellitus           (if abnormal result  is confirmed)  5.7-6.4%   Increased risk of developing Diabetes Mellitus  References:Diagnosis and Classification of Diabetes Mellitus,Diabetes [id number] 1):S62-S69 and Standards of Medical Care in         Diabetes - 2011,Diabetes Care,2011,34  (Suppl 1):S11-S61.   Lab Results  Component Value Date   INSULIN 19.0 09/29/2018   INSULIN 17.2 05/05/2018   INSULIN 17.7 12/21/2017   INSULIN 20.4 08/31/2017   CBC    Component Value Date/Time   WBC 4.4 09/12/2017 2118   RBC 4.72 09/12/2017 2118   HGB 15.0 09/12/2017 2118   HGB 14.0 08/31/2017 1140   HCT 43.7 09/12/2017 2118   HCT 41.2 08/31/2017 1140   PLT 173 09/12/2017 2118   MCV 92.6 09/12/2017 2118   MCV 92 08/31/2017 1140   MCH 31.8 09/12/2017 2118   MCHC 34.3 09/12/2017 2118   RDW 13.1 09/12/2017 2118   RDW 13.7 08/31/2017 1140   LYMPHSABS 1.7 08/31/2017 1140   MONOABS 324 03/23/2016 1116   EOSABS 0.1 08/31/2017 1140   BASOSABS 0.0 08/31/2017 1140   Iron/TIBC/Ferritin/ %Sat No results found for: IRON, TIBC, FERRITIN, IRONPCTSAT Lipid Panel     Component Value Date/Time   CHOL 150 08/31/2017 1140   TRIG 105 08/31/2017 1140   HDL 66 08/31/2017 1140   CHOLHDL 2.6 08/31/2010 0540   VLDL 12 08/31/2010 0540   LDLCALC 63 08/31/2017 1140   Hepatic Function Panel     Component Value Date/Time   PROT 7.1 09/29/2018 1052   ALBUMIN 4.8 (H) 09/29/2018 1052   AST 18 09/29/2018 1052   ALT 16 09/29/2018 1052   ALKPHOS 54 09/29/2018 1052   BILITOT 0.3 09/29/2018  1052      Component Value Date/Time   TSH 1.680 08/31/2017 1140   TSH 1.57 03/23/2016 1116    Results for GABRIELLA, MATHIEU (MRN [id number]) as of 06/05/2019 20:29  Ref. Range 09/29/2018 10:52  Vitamin D, 25-Hydroxy Latest Ref Range: 30.0 - 100.0 ng/mL 35.5    OBESITY BEHAVIORAL INTERVENTION VISIT  Today's visit was # 32  Starting weight: 223 lbs Starting date: 08/31/2017 Today's weight : 205 lbs  Today's date: 06/05/2019 Total lbs lost to date: 18    06/05/2019  Height 5\' 3"  (1.6 m)  Weight 205 lb (93 kg)  BMI (Calculated) 36.32  BLOOD PRESSURE - SYSTOLIC [id number]  BLOOD PRESSURE - DIASTOLIC 87   Body Fat % 123456 %  Total Body Water (lbs) 77.4 lbs    ASK: We discussed the diagnosis of obesity with Kerry Hough today and Maelys agreed to give [name] permission to discuss obesity behavioral modification therapy today.  ASSESS: Kennetta has the diagnosis of obesity and her BMI today is 36.32 Alveena is in the action stage of change  ADVISE: Joliene was educated on the multiple health risks of obesity as well as the benefit of weight loss to improve her health. She was advised of the need for long term treatment and the importance of lifestyle modifications to improve her current health and to decrease her risk of future health problems.  AGREE: Multiple dietary modification options and treatment options were discussed and  Brinly agreed to follow the recommendations documented in the above note.  ARRANGE: Shyrel was educated on the importance of frequent visits to treat obesity as outlined per CMS and USPSTF guidelines and agreed to schedule her next follow up appointment today.  Corey Skains, am acting as transcriptionist for Abby Potash, PA-C I, Abby Potash, PA-C have reviewed above note and agree with its content

## 2019-06-06 LAB — COMPREHENSIVE METABOLIC PANEL
ALT: 16 IU/L (ref 0–32)
AST: 19 IU/L (ref 0–40)
Albumin/Globulin Ratio: 2 (ref 1.2–2.2)
Albumin: 4.7 g/dL (ref 3.7–4.7)
Alkaline Phosphatase: 49 IU/L (ref 39–117)
BUN/Creatinine Ratio: 15 (ref 12–28)
BUN: 15 mg/dL (ref 8–27)
Bilirubin Total: 0.5 mg/dL (ref 0.0–1.2)
CO2: 30 mmol/L — ABNORMAL HIGH (ref 20–29)
Calcium: 10.5 mg/dL — ABNORMAL HIGH (ref 8.7–10.3)
Chloride: 103 mmol/L (ref 96–106)
Creatinine, Ser: 0.99 mg/dL (ref 0.57–1.00)
GFR calc Af Amer: 65 mL/min/{1.73_m2} (ref >59)
GFR calc non Af Amer: 57 mL/min/{1.73_m2} — ABNORMAL LOW (ref >59)
Globulin, Total: 2.4 g/dL (ref 1.5–4.5)
Glucose: 108 mg/dL — ABNORMAL HIGH (ref 65–99)
Potassium: 3.6 mmol/L (ref 3.5–5.2)
Sodium: 145 mmol/L — ABNORMAL HIGH (ref 134–144)
Total Protein: 7.1 g/dL (ref 6.0–8.5)

## 2019-06-06 LAB — INSULIN, RANDOM: INSULIN: 22.5 u[IU]/mL (ref 2.6–24.9)

## 2019-06-06 LAB — VITAMIN D 25 HYDROXY (VIT D DEFICIENCY, FRACTURES): Vit D, 25-Hydroxy: 43.4 ng/mL (ref 30.0–100.0)

## 2019-06-06 LAB — LIPID PANEL WITH LDL/HDL RATIO
Cholesterol, Total: 141 mg/dL (ref 100–199)
HDL: 56 mg/dL (ref >39)
LDL Chol Calc (NIH): 66 mg/dL (ref 0–99)
LDL/HDL Ratio: 1.2 ratio (ref 0.0–3.2)
Triglycerides: 104 mg/dL (ref 0–149)
VLDL Cholesterol Cal: 19 mg/dL (ref 5–40)

## 2019-06-06 LAB — HEMOGLOBIN A1C
Est. average glucose Bld gHb Est-mCnc: 123 mg/dL
Hgb A1c MFr Bld: 5.9 % — ABNORMAL HIGH (ref 4.8–5.6)

## 2019-06-23 ENCOUNTER — Other Ambulatory Visit (INDEPENDENT_AMBULATORY_CARE_PROVIDER_SITE_OTHER): Payer: Self-pay | Admitting: Physician Assistant

## 2019-06-23 DIAGNOSIS — I10 Essential (primary) hypertension: Secondary | ICD-10-CM

## 2019-06-26 ENCOUNTER — Telehealth (INDEPENDENT_AMBULATORY_CARE_PROVIDER_SITE_OTHER): Payer: Medicare HMO | Admitting: Physician Assistant

## 2019-06-26 ENCOUNTER — Encounter (INDEPENDENT_AMBULATORY_CARE_PROVIDER_SITE_OTHER): Payer: Self-pay | Admitting: Physician Assistant

## 2019-06-26 ENCOUNTER — Other Ambulatory Visit: Payer: Self-pay

## 2019-06-26 DIAGNOSIS — E119 Type 2 diabetes mellitus without complications: Secondary | ICD-10-CM | POA: Diagnosis not present

## 2019-06-26 DIAGNOSIS — I10 Essential (primary) hypertension: Secondary | ICD-10-CM | POA: Diagnosis not present

## 2019-06-26 DIAGNOSIS — Z6836 Body mass index (BMI) 36.0-36.9, adult: Secondary | ICD-10-CM | POA: Diagnosis not present

## 2019-06-26 MED ORDER — LOSARTAN POTASSIUM 100 MG PO TABS: 100.0000 mg | ORAL_TABLET | Freq: Every day | ORAL | 0 refills | Status: DC

## 2019-06-26 MED ORDER — CHLORTHALIDONE 25 MG PO TABS: 25.0000 mg | ORAL_TABLET | Freq: Every day | ORAL | 0 refills | Status: DC

## 2019-06-26 NOTE — Progress Notes (Signed)
Office: [phone]  /  Fax: [phone] TeleHealth Visit:  Molly [name] has verbally consented to this TeleHealth visit today. The patient is located at home, the provider is located at the [employer] [facility] office. The participants in this visit include the listed provider and patient. The visit was conducted today via telephone call.  HPI:   Chief Complaint: OBESITY [name] is here to discuss her progress with her obesity treatment plan. She is on the Category 2 plan and is following her eating plan approximately 80% of the time. She states she is exercising 0 minutes 0 times per week. [name] reports that she overate for Thanksgiving, but has been doing well on the plan overall. She has been paying special attention to her protein intake.  We were unable to weigh the patient today for this TeleHealth visit. She feels as if she has lost weight since her last visit. She has lost 18 lbs since starting treatment with Molly [name].  [name] is a 73 y.o. female with hypertension and is on chlorthalidone, losartan, Avapro, furosemide, and amlodipine. Cooper Shue denies chest pain or headache. She is working weight loss to help control her blood pressure with the goal of decreasing her risk of heart attack and stroke.  Diabetes II without complication and without long-term use of insulin [name] has a diagnosis of diabetes type II and is on metformin. Rickayla states fasting blood sugars range between 90 and 110 and denies hypoglycemia. Last A1c was 5.9 on 06/05/2019. She has been working on intensive lifestyle modifications including diet, exercise, and weight loss to help control her blood glucose levels. No nausea, vomiting, diarrhea, or polyphagia.  ASSESSMENT AND PLAN:  Essential hypertension - Plan: chlorthalidone (HYGROTON) 25 MG tablet, losartan (COZAAR) 100 MG tablet  Type 2 diabetes mellitus without complication, without long-term current use of insulin (HCC)  Class 2 severe  obesity with serious comorbidity and body mass index (BMI) of 36.0 to 36.9 in adult, unspecified obesity type (Molly [name])  PLAN:  Hypertension We discussed sodium restriction, working on healthy weight loss, and a regular exercise program as the means to achieve improved blood pressure control. [name] agreed with this plan and agreed to follow up as directed. We will continue to monitor her blood pressure as well as her progress with the above lifestyle modifications. She will continue her medications as prescribed and will watch for signs of hypotension as she continues her lifestyle modifications.  Diabetes II without complication and without long-term use of insulin [name] has been given extensive diabetes education by myself today including ideal fasting and post-prandial blood glucose readings, individual ideal HgA1c goals  and hypoglycemia prevention. We discussed the importance of good blood sugar control to decrease the likelihood of diabetic complications such as nephropathy, neuropathy, limb loss, blindness, coronary artery disease, and death. We discussed the importance of intensive lifestyle modification including diet, exercise and weight loss as the first line treatment for diabetes. [name] agrees to continue her diabetes medications and will follow-up at the agreed upon time.  Obesity [name] is currently in the action stage of change. As such, her goal is to continue with weight loss efforts. She has agreed to follow the Category 2 plan. [name] has been instructed to work up to a goal of 150 minutes of combined cardio and strengthening exercise per week for weight loss and overall health benefits. We discussed the following Behavioral Modification Strategies today: increasing lean protein intake, work on meal planning and easy cooking plans.  [name] has agreed to follow-up with our clinic in 2 weeks. She was informed of the importance of frequent follow-up visits to maximize her success with intensive  lifestyle modifications for her multiple health conditions.  ALLERGIES: No Known Allergies  MEDICATIONS: Current Outpatient Medications on File Prior to Visit  Medication Sig Dispense Refill   amLODipine (NORVASC) 10 MG tablet Take 1 tablet (10 mg total) by mouth daily. 30 tablet 0   chlorthalidone (HYGROTON) 25 MG tablet TAKE 1 TABLET BY MOUTH EVERY DAY 30 tablet 0   Cholecalciferol (VITAMIN D3) 5000 units CAPS Take 1 capsule by mouth daily.     co-enzyme Q-10 30 MG capsule Take 30 mg by mouth 3 (three) times daily.     diazepam (VALIUM) 5 MG tablet Take 0.5 tablets (2.5 mg total) by mouth every 8 (eight) hours as needed (vertigo/dizziness). 10 tablet 0   escitalopram (LEXAPRO) 20 MG tablet      fluticasone (FLONASE) 50 MCG/ACT nasal spray Place 2 sprays into both nostrils daily as needed for allergies or rhinitis. 16 g 5   furosemide (LASIX) 20 MG tablet Take 20 mg by mouth as needed.      glucose blood (ACCU-CHEK ACTIVE STRIPS) test strip Use as instructed 100 each 1   irbesartan (AVAPRO) 150 MG tablet Take 1 tablet (150 mg total) by mouth daily. 30 tablet 0   Lancets (ACCU-CHEK SAFE-T PRO) lancets Use as instructed 100 each 1   levocetirizine (XYZAL) 5 MG tablet Take 1 tablet (5 mg total) by mouth every evening. 30 tablet 5   losartan (COZAAR) 100 MG tablet TAKE 1 TABLET BY MOUTH EVERY DAY 30 tablet 0   metFORMIN (GLUCOPHAGE) 500 MG tablet Take 2 in the AM and 1 in the PM 90 tablet 0   Multiple Vitamins-Minerals (MULTIVITAMIN ADULT PO) Take 1 tablet by mouth daily.     Omega-3 Fatty Acids (FISH OIL PO) Take 1 tablet by mouth daily.     ondansetron (ZOFRAN ODT) 4 MG disintegrating tablet Take 1 tablet (4 mg total) by mouth every 8 (eight) hours as needed for nausea or vomiting. 10 tablet 0   rosuvastatin (CRESTOR) 10 MG tablet Take 1 tablet (10 mg total) by mouth daily. 30 tablet 0   No current facility-administered medications on file prior to visit.     PAST  MEDICAL HISTORY: Past Medical History:  Diagnosis Date   Anxiety    Constipation    Depression    Diabetes type 2, controlled (HCC)    Diarrhea    Fatigue    Frequent urination    GERD (gastroesophageal reflux disease)    Hyperlipidemia    Hypertension    Kidney disease    Leg cramps    Multiple food allergies    Muscle stiffness    Seasonal allergies    Shortness of breath    Sinus pain    Swallowing difficulty    Swelling of extremity    Wheezing     PAST SURGICAL HISTORY: Past Surgical History:  Procedure Laterality Date   CHOLECYSTECTOMY  1999   FOOT SURGERY     MASTECTOMY      SOCIAL HISTORY: Social History   Tobacco Use   Smoking status: Former Smoker    Types: Cigarettes    Quit date: 03/23/1998    Years since quitting: 21.2   Smokeless tobacco: Never Used  Substance Use Topics   Alcohol use: Yes    Comment: occasional   Drug use: No  FAMILY HISTORY: Family History  Problem Relation Age of Onset   Allergic rhinitis Sister    Eczema Grandchild    Eczema Daughter    Urticaria Daughter    Lupus Other    Hypertension Mother    Stroke Mother    Obesity Mother    Hypertension Father    Heart disease Father    Sudden death Father    Asthma Neg Hx    Atopy Neg Hx    Angioedema Neg Hx    ROS: Review of Systems  Cardiovascular: Negative for chest pain.  Gastrointestinal: Negative for diarrhea, nausea and vomiting.  Neurological: Negative for headaches.  Endo/Heme/Allergies:       Negative for polyphagia. Negative for hypoglycemia.   PHYSICAL EXAM: Pt in no acute distress  RECENT LABS AND TESTS: BMET    Component Value Date/Time   NA 145 (H) 06/05/2019 0730   K 3.6 06/05/2019 0730   CL 103 06/05/2019 0730   CO2 30 (H) 06/05/2019 0730   GLUCOSE 108 (H) 06/05/2019 0730   GLUCOSE 134 (H) 09/12/2017 2118   BUN 15 06/05/2019 0730   CREATININE 0.99 06/05/2019 0730   CREATININE 0.90 03/23/2016  1116   CALCIUM 10.5 (H) 06/05/2019 0730   GFRNONAA 57 (L) 06/05/2019 0730   GFRAA 65 06/05/2019 0730   Lab Results  Component Value Date   HGBA1C 5.9 (H) 06/05/2019   HGBA1C 6.1 (H) 09/29/2018   HGBA1C 5.9 (H) 05/05/2018   HGBA1C 6.0 (H) 12/21/2017   HGBA1C 6.5 (H) 08/31/2017   Lab Results  Component Value Date   INSULIN 22.5 06/05/2019   INSULIN 19.0 09/29/2018   INSULIN 17.2 05/05/2018   INSULIN 17.7 12/21/2017   INSULIN 20.4 08/31/2017   CBC    Component Value Date/Time   WBC 4.4 09/12/2017 2118   RBC 4.72 09/12/2017 2118   HGB 15.0 09/12/2017 2118   HGB 14.0 08/31/2017 1140   HCT 43.7 09/12/2017 2118   HCT 41.2 08/31/2017 1140   PLT 173 09/12/2017 2118   MCV 92.6 09/12/2017 2118   MCV 92 08/31/2017 1140   MCH 31.8 09/12/2017 2118   MCHC 34.3 09/12/2017 2118   RDW 13.1 09/12/2017 2118   RDW 13.7 08/31/2017 1140   LYMPHSABS 1.7 08/31/2017 1140   MONOABS 324 03/23/2016 1116   EOSABS 0.1 08/31/2017 1140   BASOSABS 0.0 08/31/2017 1140   Iron/TIBC/Ferritin/ %Sat No results found for: IRON, TIBC, FERRITIN, IRONPCTSAT Lipid Panel     Component Value Date/Time   CHOL 141 06/05/2019 0730   TRIG 104 06/05/2019 0730   HDL 56 06/05/2019 0730   CHOLHDL 2.6 08/31/2010 0540   VLDL 12 08/31/2010 0540   LDLCALC 66 06/05/2019 0730   Hepatic Function Panel     Component Value Date/Time   PROT 7.1 06/05/2019 0730   ALBUMIN 4.7 06/05/2019 0730   AST 19 06/05/2019 0730   ALT 16 06/05/2019 0730   ALKPHOS 49 06/05/2019 0730   BILITOT 0.5 06/05/2019 0730      Component Value Date/Time   TSH 1.680 08/31/2017 1140   TSH 1.57 03/23/2016 1116   Results for Ashrita, Minge Ahonesty (MRN [id number]) as of 06/26/2019 10:12  Ref. Range 06/05/2019 07:30  Vitamin D, 25-Hydroxy Latest Ref Range: 30.0 - 100.0 ng/mL 43.4   I, Michaelene Song, am acting as Location manager for [employer], PA-C I, Abby Potash, PA-C have reviewed above note and agree with its content

## 2019-07-10 ENCOUNTER — Other Ambulatory Visit: Payer: Self-pay

## 2019-07-10 ENCOUNTER — Encounter (INDEPENDENT_AMBULATORY_CARE_PROVIDER_SITE_OTHER): Payer: Self-pay | Admitting: Physician Assistant

## 2019-07-10 ENCOUNTER — Telehealth (INDEPENDENT_AMBULATORY_CARE_PROVIDER_SITE_OTHER): Payer: Medicare HMO | Admitting: Physician Assistant

## 2019-07-10 DIAGNOSIS — I10 Essential (primary) hypertension: Secondary | ICD-10-CM

## 2019-07-10 DIAGNOSIS — Z6836 Body mass index (BMI) 36.0-36.9, adult: Secondary | ICD-10-CM | POA: Diagnosis not present

## 2019-07-10 DIAGNOSIS — E7849 Other hyperlipidemia: Secondary | ICD-10-CM

## 2019-07-10 MED ORDER — CHLORTHALIDONE 25 MG PO TABS: 25.0000 mg | ORAL_TABLET | Freq: Every day | ORAL | 0 refills | Status: DC

## 2019-07-10 MED ORDER — ROSUVASTATIN CALCIUM 10 MG PO TABS: 10.0000 mg | ORAL_TABLET | Freq: Every day | ORAL | 0 refills | Status: DC

## 2019-07-10 MED ORDER — LOSARTAN POTASSIUM 100 MG PO TABS: 100.0000 mg | ORAL_TABLET | Freq: Every day | ORAL | 0 refills | Status: DC

## 2019-07-12 NOTE — Progress Notes (Signed)
Office: [phone]  /  Fax: [phone] TeleHealth Visit:  Molly [name] has verbally consented to this TeleHealth visit today. The patient is located in her car, the provider is located at the [employer] [facility] office. The participants in this visit include the listed provider and patient and any and all parties involved. The visit was conducted today via telephone. Molly [name] was unable to use realtime audiovisual technology today and the telehealth visit was conducted via telephone (time spent on call 27 minutes).  HPI:  Chief Complaint: OBESITY Molly [name] is here to discuss her progress with her obesity treatment plan. She is on the Category 2 plan and states she is following her eating plan approximately 90 % of the time. She states she is doing resistance bands 10 to 15 minutes, 7 times per week.  Molly [name]'s most recent weight is 203 pounds (07/10/19). She reports that with the holiday, she has been eating more meat. She states that she feels better overall, since doing some resistance training.   Hypertension Molly [name] is a 73 y.o. female with hypertension. She is on Chlorthalidone, Losartan, Amlodipine, Lasix and Irbesartan. Molly [name] has no chest pain or headache. Her blood pressure readings at home are within normal range.      Hyperlipidemia Molly [name] has hyperlipidemia and she is on Crestor. She has no muscle aches or chest pain.     ASSESSMENT AND PLAN:  Essential hypertension - Plan: chlorthalidone (HYGROTON) 25 MG tablet, losartan (COZAAR) 100 MG tablet  Class 2 severe obesity with serious comorbidity and body mass index (BMI) of 36.0 to 36.9 in adult, unspecified obesity type (Molly [name])  Other hyperlipidemia - Plan: rosuvastatin (CRESTOR) 10 MG tablet  PLAN:  Hypertension Molly [name] is working on healthy weight loss and exercise to improve blood pressure control. We will watch for signs of hypotension as she continues her lifestyle modifications. Molly [name] agrees to continue chlorthalidone 25 mg  daily #90 with no refills and losartan 100 mg daily #90 with no refills and follow up as directed.  Hyperlipidemia Intensive lifestyle modifications as the first line treatment for hyperlipidemia. We discussed many lifestyle modifications today and Molly [name] will continue to work on diet, exercise and weight loss efforts. Molly [name] agrees to continue crestor 10 mg daily #30 with no refills and follow up as directed.  Obesity Molly [name] is currently in the action stage of change. As such, her goal is to continue with weight loss efforts She has agreed to follow the Category 2 plan Molly [name] has been instructed to work up to a goal of 150 minutes of combined cardio and strengthening exercise per week for weight loss and overall health benefits. We discussed the following Behavioral Modification Strategies today: planning for success and increasing lean protein intake   Toshiana has agreed to follow up with our clinic in 3 weeks. She was informed of the importance of frequent follow up visits to maximize her success with intensive lifestyle modifications for her multiple health conditions.  ALLERGIES: No Known Allergies  MEDICATIONS: Current Outpatient Medications on File Prior to Visit  Medication Sig Dispense Refill  . amLODipine (NORVASC) 10 MG tablet Take 1 tablet (10 mg total) by mouth daily. 30 tablet 0  . Cholecalciferol (VITAMIN D3) 5000 units CAPS Take 1 capsule by mouth daily.    [name] co-enzyme Q-10 30 MG capsule Take 30 mg by mouth 3 (three) times daily.    . diazepam (VALIUM) 5 MG tablet Take 0.5 tablets (2.5 mg total) by mouth every 8 (eight) hours as  needed (vertigo/dizziness). 10 tablet 0  . escitalopram (LEXAPRO) 20 MG tablet     . fluticasone (FLONASE) 50 MCG/ACT nasal spray Place 2 sprays into both nostrils daily as needed for allergies or rhinitis. 16 g 5  . furosemide (LASIX) 20 MG tablet Take 20 mg by mouth as needed.     [name] glucose blood (ACCU-CHEK ACTIVE STRIPS) test strip Use as instructed 100  each 1  . irbesartan (AVAPRO) 150 MG tablet Take 1 tablet (150 mg total) by mouth daily. 30 tablet 0  . Lancets (ACCU-CHEK SAFE-T PRO) lancets Use as instructed 100 each 1  . levocetirizine (XYZAL) 5 MG tablet Take 1 tablet (5 mg total) by mouth every evening. 30 tablet 5  . metFORMIN (GLUCOPHAGE) 500 MG tablet Take 2 in the AM and 1 in the PM 90 tablet 0  . Multiple Vitamins-Minerals (MULTIVITAMIN ADULT PO) Take 1 tablet by mouth daily.    . Omega-3 Fatty Acids (FISH OIL PO) Take 1 tablet by mouth daily.    . ondansetron (ZOFRAN ODT) 4 MG disintegrating tablet Take 1 tablet (4 mg total) by mouth every 8 (eight) hours as needed for nausea or vomiting. 10 tablet 0   No current facility-administered medications on file prior to visit.    PAST MEDICAL HISTORY: Past Medical History:  Diagnosis Date  . Anxiety   . Constipation   . Depression   . Diabetes type 2, controlled ([facility])   . Diarrhea   . Fatigue   . Frequent urination   . GERD (gastroesophageal reflux disease)   . Hyperlipidemia   . Hypertension   . Kidney disease   . Leg cramps   . Multiple food allergies   . Muscle stiffness   . Seasonal allergies   . Shortness of breath   . Sinus pain   . Swallowing difficulty   . Swelling of extremity   . Wheezing     PAST SURGICAL HISTORY: Past Surgical History:  Procedure Laterality Date  . CHOLECYSTECTOMY  1999  . FOOT SURGERY    . MASTECTOMY      SOCIAL HISTORY: Social History   Tobacco Use  . Smoking status: Former Smoker    Types: Cigarettes    Quit date: 03/23/1998    Years since quitting: 21.3  . Smokeless tobacco: Never Used  Substance Use Topics  . Alcohol use: Yes    Comment: occasional  . Drug use: No    FAMILY HISTORY: Family History  Problem Relation Age of Onset  . Allergic rhinitis Sister   . Eczema Grandchild   . Eczema Daughter   . Urticaria Daughter   . Lupus Other   . Hypertension Mother   . Stroke Mother   . Obesity Mother   .  Hypertension Father   . Heart disease Father   . Sudden death Father   . Asthma Neg Hx   . Atopy Neg Hx   . Angioedema Neg Hx     ROS: Review of Systems  Constitutional: Positive for weight loss.  Cardiovascular: Negative for chest pain.  Musculoskeletal: Negative for myalgias.  Neurological: Negative for headaches.    PHYSICAL EXAM: There were no vitals taken for this visit. There is no height or weight on file to calculate BMI. Physical Exam Vitals reviewed.  Constitutional:      General: She is not in acute distress.    Appearance: Normal appearance. She is well-developed. She is obese.  Cardiovascular:     Rate and Rhythm: Normal  rate.  Pulmonary:     Effort: Pulmonary effort is normal.  Musculoskeletal:        General: Normal range of motion.  Skin:    General: Skin is warm and dry.  Neurological:     Mental Status: She is alert and oriented to person, place, and time.  Psychiatric:        Mood and Affect: Mood normal.        Behavior: Behavior normal.     RECENT LABS AND TESTS: BMET    Component Value Date/Time   NA 145 (H) 06/05/2019 0730   K 3.6 06/05/2019 0730   CL 103 06/05/2019 0730   CO2 30 (H) 06/05/2019 0730   GLUCOSE 108 (H) 06/05/2019 0730   GLUCOSE 134 (H) 09/12/2017 2118   BUN 15 06/05/2019 0730   CREATININE 0.99 06/05/2019 0730   CREATININE 0.90 03/23/2016 1116   CALCIUM 10.5 (H) 06/05/2019 0730   GFRNONAA 57 (L) 06/05/2019 0730   GFRAA 65 06/05/2019 0730   Lab Results  Component Value Date   HGBA1C 5.9 (H) 06/05/2019   HGBA1C 6.1 (H) 09/29/2018   HGBA1C 5.9 (H) 05/05/2018   HGBA1C 6.0 (H) 12/21/2017   HGBA1C 6.5 (H) 08/31/2017   Lab Results  Component Value Date   INSULIN 22.5 06/05/2019   INSULIN 19.0 09/29/2018   INSULIN 17.2 05/05/2018   INSULIN 17.7 12/21/2017   INSULIN 20.4 08/31/2017   CBC    Component Value Date/Time   WBC 4.4 09/12/2017 2118   RBC 4.72 09/12/2017 2118   HGB 15.0 09/12/2017 2118   HGB 14.0  08/31/2017 1140   HCT 43.7 09/12/2017 2118   HCT 41.2 08/31/2017 1140   PLT 173 09/12/2017 2118   MCV 92.6 09/12/2017 2118   MCV 92 08/31/2017 1140   MCH 31.8 09/12/2017 2118   MCHC 34.3 09/12/2017 2118   RDW 13.1 09/12/2017 2118   RDW 13.7 08/31/2017 1140   LYMPHSABS 1.7 08/31/2017 1140   MONOABS 324 03/23/2016 1116   EOSABS 0.1 08/31/2017 1140   BASOSABS 0.0 08/31/2017 1140   Iron/TIBC/Ferritin/ %Sat No results found for: IRON, TIBC, FERRITIN, IRONPCTSAT Lipid Panel     Component Value Date/Time   CHOL 141 06/05/2019 0730   TRIG 104 06/05/2019 0730   HDL 56 06/05/2019 0730   CHOLHDL 2.6 08/31/2010 0540   VLDL 12 08/31/2010 0540   LDLCALC 66 06/05/2019 0730   Hepatic Function Panel     Component Value Date/Time   PROT 7.1 06/05/2019 0730   ALBUMIN 4.7 06/05/2019 0730   AST 19 06/05/2019 0730   ALT 16 06/05/2019 0730   ALKPHOS 49 06/05/2019 0730   BILITOT 0.5 06/05/2019 0730      Component Value Date/Time   TSH 1.680 08/31/2017 1140   TSH 1.57 03/23/2016 1116     OBESITY BEHAVIORAL INTERVENTION VISIT DOCUMENTATION FOR INSURANCE (~15 minutes)    ASK: We discussed the diagnosis of obesity with Kerry Hough today and Jadiah agreed to give [name] permission to discuss obesity behavioral modification therapy today.  ASSESS: Analice has the diagnosis of obesity  Thanvi is in the action stage of change   ADVISE: Bissie was educated on the multiple health risks of obesity as well as the benefit of weight loss to improve her health. She was advised of the need for long term treatment and the importance of lifestyle modifications to improve her current health and to decrease her risk of future health problems.  AGREE: Multiple dietary modification options and  treatment options were discussed and  Celisse agreed to follow the recommendations documented in the above note.  ARRANGE: Vernese was educated on the importance of frequent visits to treat obesity as outlined per CMS and USPSTF  guidelines and agreed to schedule her next follow up appointment today.   Corey Skains, am acting as transcriptionist for Abby Potash, PA-C I, Abby Potash, PA-C have reviewed above note and agree with its content

## 2019-08-01 ENCOUNTER — Other Ambulatory Visit: Payer: Self-pay

## 2019-08-01 ENCOUNTER — Encounter (INDEPENDENT_AMBULATORY_CARE_PROVIDER_SITE_OTHER): Payer: Self-pay | Admitting: Physician Assistant

## 2019-08-01 ENCOUNTER — Telehealth (INDEPENDENT_AMBULATORY_CARE_PROVIDER_SITE_OTHER): Payer: Medicare HMO | Admitting: Physician Assistant

## 2019-08-01 DIAGNOSIS — Z6836 Body mass index (BMI) 36.0-36.9, adult: Secondary | ICD-10-CM | POA: Diagnosis not present

## 2019-08-01 DIAGNOSIS — E119 Type 2 diabetes mellitus without complications: Secondary | ICD-10-CM

## 2019-08-03 NOTE — Progress Notes (Signed)
TeleHealth Visit:  Due to the COVID-19 pandemic, this visit was completed with telemedicine (audio/video) technology to reduce patient and provider exposure as well as to preserve personal protective equipment.   Molly [name] has verbally consented to this TeleHealth visit. The patient is located at home, the provider is located at the [employer] [facility] office. The participants in this visit include the listed provider and patient and any and all parties involved. The visit was conducted today via telephone.  Chief Complaint: OBESITY Molly [name] is here to discuss her progress with her obesity treatment plan along with follow-up of her obesity related diagnoses. Molly [name] is on the Category 2 Plan and states she is following her eating plan approximately 80% of the time. Molly [name] states she is doing resistance bands 15 minutes 3 to 4 times per week.  Today's visit was #: 42 Starting weight: 223 lbs Starting date: 08/31/2017  Interim History: Molly [name]'s most recent weight is 203 pounds (08/01/19). She reports doing well overall during the holidays. She is working hard to get all of her protein in every meal.  Subjective:   Diabetes Molly [name]'s last A1c was at 5.9 (06/05/19). She is on metformin. Molly [name] has no nausea, vomiting, diarrhea or polyphagia. Her fasting blood sugars average 100.  Assessment/Plan:   Diabetes Molly [name] has been given diabetes education by myself today. Good blood sugar control is important to decrease the likelihood of diabetic complications such as nephropathy, neuropathy, limb loss, blindness, coronary artery disease, and death. Intensive lifestyle modification including diet, exercise and weight loss were discussed as the first line treatment for diabetes. Molly [name] will continue with medications and weight loss and follow up with our clinic in 2 weeks.  Molly [name] is currently in the action stage of change. As such, her goal is to continue with weight loss efforts. She has agreed to Category  2 Plan.   We discussed the following exercise goals today: Older adults should follow the adult guidelines. When older adults cannot meet the adult guidelines, they should be as physically active as their abilities and conditions will allow.  Older adults should do exercises that maintain or improve balance if they are at risk of falling.   We discussed the following behavioral modification strategies today: increasing lean protein intake and no skipping meals.  Molly [name] has agreed to follow-up with our clinic in 2 weeks. She was informed of the importance of frequent follow-up visits to maximize her success with intensive lifestyle modifications for her multiple health conditions.  Objective:   VITALS: Per patient if applicable, see vitals. GENERAL: Alert and in no acute distress. CARDIOPULMONARY: No increased WOB. Speaking in clear sentences.  PSYCH: Pleasant and cooperative. Speech normal rate and rhythm. Affect is appropriate. Insight and judgement are appropriate. Attention is focused, linear, and appropriate.  NEURO: Oriented as arrived to appointment on time with no prompting.   Lab Results  Component Value Date   CREATININE 0.99 06/05/2019   BUN 15 06/05/2019   NA 145 (H) 06/05/2019   K 3.6 06/05/2019   CL 103 06/05/2019   CO2 30 (H) 06/05/2019   Lab Results  Component Value Date   ALT 16 06/05/2019   AST 19 06/05/2019   ALKPHOS 49 06/05/2019   BILITOT 0.5 06/05/2019   Lab Results  Component Value Date   HGBA1C 5.9 (H) 06/05/2019   HGBA1C 6.1 (H) 09/29/2018   HGBA1C 5.9 (H) 05/05/2018   HGBA1C 6.0 (H) 12/21/2017   HGBA1C 6.5 (H) 08/31/2017  Lab Results  Component Value Date   INSULIN 22.5 06/05/2019   INSULIN 19.0 09/29/2018   INSULIN 17.2 05/05/2018   INSULIN 17.7 12/21/2017   INSULIN 20.4 08/31/2017   Lab Results  Component Value Date   TSH 1.680 08/31/2017   Lab Results  Component Value Date   CHOL 141 06/05/2019   HDL 56 06/05/2019   LDLCALC 66  06/05/2019   TRIG 104 06/05/2019   CHOLHDL 2.6 08/31/2010   Lab Results  Component Value Date   WBC 4.4 09/12/2017   HGB 15.0 09/12/2017   HCT 43.7 09/12/2017   MCV 92.6 09/12/2017   PLT 173 09/12/2017   No results found for: IRON, TIBC, FERRITIN Attestation Statements:   Reviewed by clinician on day of visit: allergies, medications, problem list, medical history, surgical history, family history, social history and previous encounter notes.  Corey Skains, am acting as Location manager for [employer], PA-C.  I have reviewed the above documentation for accuracy and completeness, and I agree with the above. Abby Potash, PA-C  Time spent on visit including pre-visit chart review and post-visit care was 18 minutes.

## 2019-08-14 ENCOUNTER — Other Ambulatory Visit: Payer: Self-pay

## 2019-08-14 ENCOUNTER — Encounter (INDEPENDENT_AMBULATORY_CARE_PROVIDER_SITE_OTHER): Payer: Self-pay | Admitting: Physician Assistant

## 2019-08-14 ENCOUNTER — Telehealth (INDEPENDENT_AMBULATORY_CARE_PROVIDER_SITE_OTHER): Payer: Medicare HMO | Admitting: Physician Assistant

## 2019-08-14 DIAGNOSIS — E7849 Other hyperlipidemia: Secondary | ICD-10-CM

## 2019-08-14 DIAGNOSIS — Z6836 Body mass index (BMI) 36.0-36.9, adult: Secondary | ICD-10-CM

## 2019-08-15 NOTE — Progress Notes (Signed)
TeleHealth Visit:  Due to the COVID-19 pandemic, this visit was completed with telemedicine (audio/video) technology to reduce patient and provider exposure as well as to preserve personal protective equipment.   Aleigha has verbally consented to this TeleHealth visit. The patient is located at home, the provider is located at the [facility] office. The participants in this visit include the listed provider and patient. The visit was conducted today via audio.  Kennya was unable to use realtime audiovisual technology today and the telehealth visit was conducted via telephone.  Chief Complaint: OBESITY Bradlee is here to discuss her progress with her obesity treatment plan along with follow-up of her obesity related diagnoses. Meridian is on the Category 2 Plan and states she is following her eating plan approximately 90% of the time. Jennay states she is walking on treadmill 15 minutes 4 times per week.  Today's visit was #: 29 Starting weight: 223 lbs Starting date: 08/31/2017  Interim History: Ceclia's most recent weight was 202 lbs. She reports doing a better job getting in more protein at breakfast and lunch. She is not weighing her protein consistently at night.   Subjective:   1. Other hyperlipidemia Demetress has hyperlipidemia and has been trying to improve her cholesterol levels with intensive lifestyle modification including a low saturated fat diet, exercise and weight loss. She is on Crestor. She denies any chest pain, claudication or myalgias.  Lab Results  Component Value Date   ALT 16 06/05/2019   AST 19 06/05/2019   ALKPHOS 49 06/05/2019   BILITOT 0.5 06/05/2019   Lab Results  Component Value Date   CHOL 141 06/05/2019   HDL 56 06/05/2019   LDLCALC 66 06/05/2019   TRIG 104 06/05/2019   CHOLHDL 2.6 08/31/2010    Assessment/Plan:   1. Other hyperlipidemia Cardiovascular risk and specific lipid/LDL goals reviewed.  We discussed several lifestyle modifications  today and Anelys will continue to work on diet, exercise and weight loss efforts. Orders and follow up as documented in patient record.   Counseling Intensive lifestyle modifications are the first line treatment for this issue. . Dietary changes: Increase soluble fiber. Decrease simple carbohydrates. . Exercise changes: Moderate to vigorous-intensity aerobic activity 150 minutes per week if tolerated. . Lipid-lowering medications: see documented in medical record.   2. Class 2 severe obesity with serious comorbidity and body mass index (BMI) of 36.0 to 36.9 in adult, unspecified obesity type [facility]) Rhyla is currently in the action stage of change. As such, her goal is to continue with weight loss efforts. She has agreed to the Category 2 Plan.   Exercise goals: For substantial health benefits, adults should do at least 150 minutes (2 hours and 30 minutes) a week of moderate-intensity, or 75 minutes (1 hour and 15 minutes) a week of vigorous-intensity aerobic physical activity, or an equivalent combination of moderate- and vigorous-intensity aerobic activity. Aerobic activity should be performed in episodes of at least 10 minutes, and preferably, it should be spread throughout the week. Adults should also include muscle-strengthening activities that involve all major muscle groups on 2 or more days a week.  Behavioral modification strategies: increasing lean protein intake and meal planning and cooking strategies.  Yuka has agreed to follow-up with our clinic in 3 weeks. She was informed of the importance of frequent follow-up visits to maximize her success with intensive lifestyle modifications for her multiple health conditions.   Objective:   VITALS: Per patient if applicable, see vitals. GENERAL: Alert  and in no acute distress. CARDIOPULMONARY: No increased WOB. Speaking in clear sentences.  PSYCH: Pleasant and cooperative. Speech normal rate and rhythm. Affect is appropriate. Insight and  judgement are appropriate. Attention is focused, linear, and appropriate.  NEURO: Oriented as arrived to appointment on time with no prompting.   Lab Results  Component Value Date   CREATININE 0.99 06/05/2019   BUN 15 06/05/2019   NA 145 (H) 06/05/2019   K 3.6 06/05/2019   CL 103 06/05/2019   CO2 30 (H) 06/05/2019   Lab Results  Component Value Date   ALT 16 06/05/2019   AST 19 06/05/2019   ALKPHOS 49 06/05/2019   BILITOT 0.5 06/05/2019   Lab Results  Component Value Date   HGBA1C 5.9 (H) 06/05/2019   HGBA1C 6.1 (H) 09/29/2018   HGBA1C 5.9 (H) 05/05/2018   HGBA1C 6.0 (H) 12/21/2017   HGBA1C 6.5 (H) 08/31/2017   Lab Results  Component Value Date   INSULIN 22.5 06/05/2019   INSULIN 19.0 09/29/2018   INSULIN 17.2 05/05/2018   INSULIN 17.7 12/21/2017   INSULIN 20.4 08/31/2017   Lab Results  Component Value Date   TSH 1.680 08/31/2017   Lab Results  Component Value Date   CHOL 141 06/05/2019   HDL 56 06/05/2019   LDLCALC 66 06/05/2019   TRIG 104 06/05/2019   CHOLHDL 2.6 08/31/2010   Lab Results  Component Value Date   WBC 4.4 09/12/2017   HGB 15.0 09/12/2017   HCT 43.7 09/12/2017   MCV 92.6 09/12/2017   PLT 173 09/12/2017   No results found for: IRON, TIBC, FERRITIN  Attestation Statements:   Reviewed by clinician on day of visit: allergies, medications, problem list, medical history, surgical history, family history, social history, and previous encounter notes.  Time spent on visit including pre-visit chart review and post-visit care was 18 minutes.   IRenee Ramus, LPN, am acting as Location manager for [employer], PA-C .  I have reviewed the above documentation for accuracy and completeness, and I agree with the above. Abby Potash, PA-C

## 2019-09-04 ENCOUNTER — Telehealth (INDEPENDENT_AMBULATORY_CARE_PROVIDER_SITE_OTHER): Payer: Medicare HMO | Admitting: Physician Assistant

## 2019-09-05 ENCOUNTER — Encounter (INDEPENDENT_AMBULATORY_CARE_PROVIDER_SITE_OTHER): Payer: Self-pay | Admitting: Physician Assistant

## 2019-09-05 ENCOUNTER — Telehealth (INDEPENDENT_AMBULATORY_CARE_PROVIDER_SITE_OTHER): Payer: Medicare HMO | Admitting: Physician Assistant

## 2019-09-05 ENCOUNTER — Other Ambulatory Visit: Payer: Self-pay

## 2019-09-05 DIAGNOSIS — I10 Essential (primary) hypertension: Secondary | ICD-10-CM | POA: Diagnosis not present

## 2019-09-05 DIAGNOSIS — Z6836 Body mass index (BMI) 36.0-36.9, adult: Secondary | ICD-10-CM

## 2019-09-05 NOTE — Progress Notes (Signed)
TeleHealth Visit:  Due to the COVID-19 pandemic, this visit was completed with telemedicine (audio/video) technology to reduce patient and provider exposure as well as to preserve personal protective equipment.   Aydriana Kostrzewa has verbally consented to this TeleHealth visit. The patient is located at home, the provider is located at the [facility] office. The participants in this visit include the listed provider and patient. The visit was conducted today via telephone call.  Francisquita was unable to use realtime audiovisual technology today and the telehealth visit was conducted via telephone.  Chief Complaint: OBESITY Mayella is here to discuss her progress with her obesity treatment plan along with follow-up of her obesity related diagnoses. Zanayah is on the Category 2 Plan and states she is following her eating plan approximately 70% of the time. Annaclara states she is walking/resistance 20 minutes 4 times per week.  Today's visit was #: 18 Starting weight: 223 lbs Starting date: 08/31/2017  Interim History: Kamyah reports that she had a head cold recently and her appetite was decreased. Her most recent weight was 203 lbs.  Subjective:   Essential hypertension. Rocklynn reports her blood pressure this morning to be 131/84. She checks her blood pressure at home. No chest pain or headache. She is on Norvasc, Lasix, and losartan.   BP Readings from Last 3 Encounters:  06/05/19 (!) 151/87  09/29/18 120/77  09/08/18 (!) 153/78   Lab Results  Component Value Date   CREATININE 0.99 06/05/2019   CREATININE 0.95 09/29/2018   CREATININE 0.97 05/05/2018   Assessment/Plan:   Essential hypertension. Sameeha is working on healthy weight loss and exercise to improve blood pressure control. We will watch for signs of hypotension as she continues her lifestyle modifications. Will continue to monitor. She will continue her blood pressure medications as prescribed.  Class 2 severe obesity with  serious comorbidity and body mass index (BMI) of 36.0 to 36.9 in adult, unspecified obesity type ([place]).  Tanganyika is currently in the action stage of change. As such, her goal is to continue with weight loss efforts. She has agreed to the Category 2 Plan.   Exercise goals: Older adults should follow the adult guidelines. When older adults cannot meet the adult guidelines, they should be as physically active as their abilities and conditions will allow.   Behavioral modification strategies: increasing lean protein intake and no skipping meals.  Abria has agreed to follow-up with our clinic in 3 weeks. She was informed of the importance of frequent follow-up visits to maximize her success with intensive lifestyle modifications for her multiple health conditions.  Objective:   VITALS: Per patient if applicable, see vitals. GENERAL: Alert and in no acute distress. CARDIOPULMONARY: No increased WOB. Speaking in clear sentences.  PSYCH: Pleasant and cooperative. Speech normal rate and rhythm. Affect is appropriate. Insight and judgement are appropriate. Attention is focused, linear, and appropriate.  NEURO: Oriented as arrived to appointment on time with no prompting.   Lab Results  Component Value Date   CREATININE 0.99 06/05/2019   BUN 15 06/05/2019   NA 145 (H) 06/05/2019   K 3.6 06/05/2019   CL 103 06/05/2019   CO2 30 (H) 06/05/2019   Lab Results  Component Value Date   ALT 16 06/05/2019   AST 19 06/05/2019   ALKPHOS 49 06/05/2019   BILITOT 0.5 06/05/2019   Lab Results  Component Value Date   HGBA1C 5.9 (H) 06/05/2019   HGBA1C 6.1 (H) 09/29/2018   HGBA1C 5.9 (  H) 05/05/2018   HGBA1C 6.0 (H) 12/21/2017   HGBA1C 6.5 (H) 08/31/2017   Lab Results  Component Value Date   INSULIN 22.5 06/05/2019   INSULIN 19.0 09/29/2018   INSULIN 17.2 05/05/2018   INSULIN 17.7 12/21/2017   INSULIN 20.4 08/31/2017   Lab Results  Component Value Date   TSH 1.680 08/31/2017   Lab Results    Component Value Date   CHOL 141 06/05/2019   HDL 56 06/05/2019   LDLCALC 66 06/05/2019   TRIG 104 06/05/2019   CHOLHDL 2.6 08/31/2010   Lab Results  Component Value Date   WBC 4.4 09/12/2017   HGB 15.0 09/12/2017   HCT 43.7 09/12/2017   MCV 92.6 09/12/2017   PLT 173 09/12/2017   No results found for: IRON, TIBC, FERRITIN  Attestation Statements:   Reviewed by clinician on day of visit: allergies, medications, problem list, medical history, surgical history, family history, social history, and previous encounter notes.  Time spent on visit including pre-visit chart review and post-visit care was 20 minutes.   IMichaelene Song, am acting as transcriptionist for Abby Potash, PA-C   I have reviewed the above documentation for accuracy and completeness, and I agree with the above. Abby Potash, PA-C

## 2019-09-25 ENCOUNTER — Telehealth (INDEPENDENT_AMBULATORY_CARE_PROVIDER_SITE_OTHER): Payer: Medicare HMO | Admitting: Physician Assistant

## 2019-09-25 ENCOUNTER — Encounter (INDEPENDENT_AMBULATORY_CARE_PROVIDER_SITE_OTHER): Payer: Self-pay | Admitting: Physician Assistant

## 2019-09-25 ENCOUNTER — Other Ambulatory Visit: Payer: Self-pay

## 2019-09-25 DIAGNOSIS — E7849 Other hyperlipidemia: Secondary | ICD-10-CM | POA: Diagnosis not present

## 2019-09-25 DIAGNOSIS — Z6836 Body mass index (BMI) 36.0-36.9, adult: Secondary | ICD-10-CM

## 2019-09-26 NOTE — Progress Notes (Signed)
TeleHealth Visit:  Due to the COVID-19 pandemic, this visit was completed with telemedicine (audio/video) technology to reduce patient and provider exposure as well as to preserve personal protective equipment.   Molly [name] has verbally consented to this TeleHealth visit. The patient is located at home, the provider is located at the [facility] office. The participants in this visit include the listed provider and patient. The visit was conducted today via audio.  Molly [name] was unable to use realtime audiovisual technology today and the telehealth visit was conducted via telephone.  Chief Complaint: OBESITY Molly [name] is here to discuss her progress with her obesity treatment plan along with follow-up of her obesity related diagnoses. Molly [name] is on the Category 2 Plan and states she is following her eating plan approximately 85% of the time. Molly [name] states she is walking 15-20 minutes 4 times per week.  Today's visit was #: 33 Starting weight: 223 lbs Starting date: 08/31/2017  Interim History: Molly [name]'s most recent weight was 201 lbs. She states that she has done a better job getting in more protein the last few weeks and her energy is better.  Subjective:   Other hyperlipidemia. Molly [name] is on Crestor. No chest pain. No myalgias. She is walking 4 times a week.   Lab Results  Component Value Date   CHOL 141 06/05/2019   HDL 56 06/05/2019   LDLCALC 66 06/05/2019   TRIG 104 06/05/2019   CHOLHDL 2.6 08/31/2010   Lab Results  Component Value Date   ALT 16 06/05/2019   AST 19 06/05/2019   ALKPHOS 49 06/05/2019   BILITOT 0.5 06/05/2019   The 10-year ASCVD risk score Molly Bussing DC Jr., et al., 2013) is: 30.2%   Values used to calculate the score:     Age: 74 years     Sex: Female     Is Non-Hispanic African American: Yes     Diabetic: Yes     Tobacco smoker: No     Systolic Blood Pressure: [id number] mmHg     Is BP treated: Yes     HDL Cholesterol: 60 MG/DL     Total Cholesterol: 162  MG/DL  Assessment/Plan:   Other hyperlipidemia. Cardiovascular risk and specific lipid/LDL goals reviewed.  We discussed several lifestyle modifications today and Molly [name] will continue to work on diet, exercise and weight loss efforts. Orders and follow up as documented in patient record. She will continue medications as directed.  Counseling Intensive lifestyle modifications are the first line treatment for this issue. . Dietary changes: Increase soluble fiber. Decrease simple carbohydrates. . Exercise changes: Moderate to vigorous-intensity aerobic activity 150 minutes per week if tolerated. . Lipid-lowering medications: see documented in medical record.  Class 2 severe obesity with serious comorbidity and body mass index (BMI) of 36.0 to 36.9 in adult, unspecified obesity type (Molly [name]).  Molly [name] is currently in the action stage of change. As such, her goal is to continue with weight loss efforts. She has agreed to the Category 2 Plan.   Exercise goals: Older adults should follow the adult guidelines. When older adults cannot meet the adult guidelines, they should be as physically active as their abilities and conditions will allow.   Behavioral modification strategies: meal planning and cooking strategies and keeping healthy foods in the home.  Molly [name] has agreed to follow-up with our clinic in 2 weeks. She was informed of the importance of frequent follow-up visits to maximize her success with intensive lifestyle modifications for her multiple health conditions.  Objective:   VITALS: Per patient if applicable, see vitals. GENERAL: Alert and in no acute distress. CARDIOPULMONARY: No increased WOB. Speaking in clear sentences.  PSYCH: Pleasant and cooperative. Speech normal rate and rhythm. Affect is appropriate. Insight and judgement are appropriate. Attention is focused, linear, and appropriate.  NEURO: Oriented as arrived to appointment on time with no prompting.   Lab Results  Component  Value Date   CREATININE 0.99 06/05/2019   BUN 15 06/05/2019   NA 145 (H) 06/05/2019   K 3.6 06/05/2019   CL 103 06/05/2019   CO2 30 (H) 06/05/2019   Lab Results  Component Value Date   ALT 16 06/05/2019   AST 19 06/05/2019   ALKPHOS 49 06/05/2019   BILITOT 0.5 06/05/2019   Lab Results  Component Value Date   HGBA1C 5.9 (H) 06/05/2019   HGBA1C 6.1 (H) 09/29/2018   HGBA1C 5.9 (H) 05/05/2018   HGBA1C 6.0 (H) 12/21/2017   HGBA1C 6.5 (H) 08/31/2017   Lab Results  Component Value Date   INSULIN 22.5 06/05/2019   INSULIN 19.0 09/29/2018   INSULIN 17.2 05/05/2018   INSULIN 17.7 12/21/2017   INSULIN 20.4 08/31/2017   Lab Results  Component Value Date   TSH 1.680 08/31/2017   Lab Results  Component Value Date   CHOL 141 06/05/2019   HDL 56 06/05/2019   LDLCALC 66 06/05/2019   TRIG 104 06/05/2019   CHOLHDL 2.6 08/31/2010   Lab Results  Component Value Date   WBC 4.4 09/12/2017   HGB 15.0 09/12/2017   HCT 43.7 09/12/2017   MCV 92.6 09/12/2017   PLT 173 09/12/2017   No results found for: IRON, TIBC, FERRITIN  Attestation Statements:   Reviewed by clinician on day of visit: allergies, medications, problem list, medical history, surgical history, family history, social history, and previous encounter notes.  IMichaelene [name], am acting as transcriptionist for Abby Potash, PA-C   I have reviewed the above documentation for accuracy and completeness, and I agree with the above. Abby Potash, PA-C

## 2019-10-10 ENCOUNTER — Other Ambulatory Visit: Payer: Self-pay

## 2019-10-10 ENCOUNTER — Encounter (INDEPENDENT_AMBULATORY_CARE_PROVIDER_SITE_OTHER): Payer: Self-pay | Admitting: Physician Assistant

## 2019-10-10 ENCOUNTER — Telehealth (INDEPENDENT_AMBULATORY_CARE_PROVIDER_SITE_OTHER): Payer: Medicare HMO | Admitting: Physician Assistant

## 2019-10-10 DIAGNOSIS — E119 Type 2 diabetes mellitus without complications: Secondary | ICD-10-CM

## 2019-10-10 DIAGNOSIS — Z6836 Body mass index (BMI) 36.0-36.9, adult: Secondary | ICD-10-CM | POA: Diagnosis not present

## 2019-10-10 NOTE — Progress Notes (Signed)
TeleHealth Visit:  Due to the COVID-19 pandemic, this visit was completed with telemedicine (audio/video) technology to reduce patient and provider exposure as well as to preserve personal protective equipment.   Molly [name] has verbally consented to this TeleHealth visit. The patient is located at home, the provider is located at the [facility] office. The participants in this visit include the listed provider and patient. The visit was conducted today via audio.  Molly [name] was unable to use realtime audiovisual technology today and the telehealth visit was conducted via telephone.  Chief Complaint: OBESITY Molly [name] is here to discuss her progress with her obesity treatment plan along with follow-up of her obesity related diagnoses. Molly [name] is on the Category 2 Plan and states she is following her eating plan approximately 90% of the time. Molly [name] states she is walking and using resistance bands 3-4 times per week.  Today's visit was #: 38 Starting weight: 223 lbs Starting date: 08/31/2017  Interim History: Molly [name] reports that she continues to try to get an adequate amount of protein in each meal.  Subjective:   Type 2 diabetes mellitus without complication, without long-term current use of insulin (Molly [name]). Fasting blood sugars range between 99 and 120. Molly [name] is on metformin.  Lab Results  Component Value Date   HGBA1C 5.9 (H) 06/05/2019   HGBA1C 6.1 (H) 09/29/2018   HGBA1C 5.9 (H) 05/05/2018   Lab Results  Component Value Date   LDLCALC 66 06/05/2019   CREATININE 0.99 06/05/2019   Lab Results  Component Value Date   INSULIN 22.5 06/05/2019   INSULIN 19.0 09/29/2018   INSULIN 17.2 05/05/2018   INSULIN 17.7 12/21/2017   INSULIN 20.4 08/31/2017   Assessment/Plan:   Type 2 diabetes mellitus without complication, without long-term current use of insulin (Molly [name]). Good blood sugar control is important to decrease the likelihood of diabetic complications such as nephropathy,  neuropathy, limb loss, blindness, coronary artery disease, and death. Intensive lifestyle modification including diet, exercise and weight loss are the first line of treatment for diabetes. Shevelle will continue her medication as directed.  Class 2 severe obesity with serious comorbidity and body mass index (BMI) of 36.0 to 36.9 in adult, unspecified obesity type (Molly [name]).  Molly [name] is currently in the action stage of change. As such, her goal is to continue with weight loss efforts. She has agreed to the Category 2 Plan.   Exercise goals: Older adults should follow the adult guidelines. When older adults cannot meet the adult guidelines, they should be as physically active as their abilities and conditions will allow.   Behavioral modification strategies: meal planning and cooking strategies and keeping healthy foods in the home.  Molly [name] has agreed to follow-up with our clinic in 2 weeks. She was informed of the importance of frequent follow-up visits to maximize her success with intensive lifestyle modifications for her multiple health conditions.  Objective:   VITALS: Per patient if applicable, see vitals. GENERAL: Alert and in no acute distress. CARDIOPULMONARY: No increased WOB. Speaking in clear sentences.  PSYCH: Pleasant and cooperative. Speech normal rate and rhythm. Affect is appropriate. Insight and judgement are appropriate. Attention is focused, linear, and appropriate.  NEURO: Oriented as arrived to appointment on time with no prompting.   Lab Results  Component Value Date   CREATININE 0.99 06/05/2019   BUN 15 06/05/2019   NA 145 (H) 06/05/2019   K 3.6 06/05/2019   CL 103 06/05/2019   CO2 30 (H) 06/05/2019   Lab  Results  Component Value Date   ALT 16 06/05/2019   AST 19 06/05/2019   ALKPHOS 49 06/05/2019   BILITOT 0.5 06/05/2019   Lab Results  Component Value Date   HGBA1C 5.9 (H) 06/05/2019   HGBA1C 6.1 (H) 09/29/2018   HGBA1C 5.9 (H) 05/05/2018   HGBA1C 6.0 (H) 12/21/2017    HGBA1C 6.5 (H) 08/31/2017   Lab Results  Component Value Date   INSULIN 22.5 06/05/2019   INSULIN 19.0 09/29/2018   INSULIN 17.2 05/05/2018   INSULIN 17.7 12/21/2017   INSULIN 20.4 08/31/2017   Lab Results  Component Value Date   TSH 1.680 08/31/2017   Lab Results  Component Value Date   CHOL 141 06/05/2019   HDL 56 06/05/2019   LDLCALC 66 06/05/2019   TRIG 104 06/05/2019   CHOLHDL 2.6 08/31/2010   Lab Results  Component Value Date   WBC 4.4 09/12/2017   HGB 15.0 09/12/2017   HCT 43.7 09/12/2017   MCV 92.6 09/12/2017   PLT 173 09/12/2017   No results found for: IRON, TIBC, FERRITIN  Attestation Statements:   Reviewed by clinician on day of visit: allergies, medications, problem list, medical history, surgical history, family history, social history, and previous encounter notes.  IMichaelene Song, am acting as transcriptionist for Abby Potash, PA-C   I have reviewed the above documentation for accuracy and completeness, and I agree with the above. Abby Potash, PA-C

## 2019-10-12 ENCOUNTER — Other Ambulatory Visit (INDEPENDENT_AMBULATORY_CARE_PROVIDER_SITE_OTHER): Payer: Self-pay | Admitting: Physician Assistant

## 2019-10-12 DIAGNOSIS — I10 Essential (primary) hypertension: Secondary | ICD-10-CM

## 2019-10-24 ENCOUNTER — Ambulatory Visit (INDEPENDENT_AMBULATORY_CARE_PROVIDER_SITE_OTHER): Payer: Medicare HMO | Admitting: Physician Assistant

## 2019-11-11 ENCOUNTER — Other Ambulatory Visit (INDEPENDENT_AMBULATORY_CARE_PROVIDER_SITE_OTHER): Payer: Self-pay | Admitting: Physician Assistant

## 2019-11-11 DIAGNOSIS — I10 Essential (primary) hypertension: Secondary | ICD-10-CM

## 2019-11-23 ENCOUNTER — Ambulatory Visit (INDEPENDENT_AMBULATORY_CARE_PROVIDER_SITE_OTHER): Payer: Medicare HMO | Admitting: Physician Assistant

## 2019-11-23 ENCOUNTER — Other Ambulatory Visit: Payer: Self-pay

## 2019-11-23 ENCOUNTER — Encounter (INDEPENDENT_AMBULATORY_CARE_PROVIDER_SITE_OTHER): Payer: Self-pay | Admitting: Physician Assistant

## 2019-11-23 VITALS — BP 159/82 | HR 97 | Temp 98.7°F | Ht 63.0 in | Wt 208.0 lb

## 2019-11-23 DIAGNOSIS — Z6836 Body mass index (BMI) 36.0-36.9, adult: Secondary | ICD-10-CM | POA: Diagnosis not present

## 2019-11-23 DIAGNOSIS — E7849 Other hyperlipidemia: Secondary | ICD-10-CM | POA: Diagnosis not present

## 2019-11-23 DIAGNOSIS — I10 Essential (primary) hypertension: Secondary | ICD-10-CM | POA: Diagnosis not present

## 2019-11-23 MED ORDER — LOSARTAN POTASSIUM 100 MG PO TABS: 100.0000 mg | ORAL_TABLET | Freq: Every day | ORAL | 0 refills | Status: DC

## 2019-11-23 MED ORDER — CHLORTHALIDONE 25 MG PO TABS: 25.0000 mg | ORAL_TABLET | Freq: Every day | ORAL | 0 refills | Status: DC

## 2019-11-23 NOTE — Progress Notes (Signed)
Chief Complaint:   OBESITY Cystal Gavan is here to discuss her progress with her obesity treatment plan along with follow-up of her obesity related diagnoses. Lamiah is on the Category 2 Plan and states she is following her eating plan approximately 80% of the time. Zanie states she is walking 10-15 minutes 3 times per week.  Today's visit was #: 24 Starting weight: 223 lbs Starting date: 08/31/2017 Today's weight: 208 lbs Today's date: 11/23/2019 Total lbs lost to date: 15 Total lbs lost since last in-office visit: 0  Interim History: Katen states that she has not been eating quite on plan recently, especially with her birthday a couple of days ago. She is not weighing her meat. She is traveling to the beach next week.  Subjective:   Essential hypertension. Amoriah is on chlorthalidone and losartan. She has been out of the chlorthalidone for a week. No headache or chest pain. She feels like she is retaining fluid today.  BP Readings from Last 3 Encounters:  11/23/19 (!) 159/82  06/05/19 (!) 151/87  09/29/18 120/77   Lab Results  Component Value Date   CREATININE 0.99 06/05/2019   CREATININE 0.95 09/29/2018   CREATININE 0.97 05/05/2018   Other hyperlipidemia. Nakaylah is on Crestor. No chest pain or myalgias.   Lab Results  Component Value Date   CHOL 141 06/05/2019   HDL 56 06/05/2019   LDLCALC 66 06/05/2019   TRIG 104 06/05/2019   CHOLHDL 2.6 08/31/2010   Lab Results  Component Value Date   ALT 16 06/05/2019   AST 19 06/05/2019   ALKPHOS 49 06/05/2019   BILITOT 0.5 06/05/2019   The 10-year ASCVD risk score Mikey Bussing DC Jr., et al., 2013) is: 36.4%   Values used to calculate the score:     Age: 38 years     Sex: Female     Is Non-Hispanic African American: Yes     Diabetic: Yes     Tobacco smoker: No     Systolic Blood Pressure: [id number] mmHg     Is BP treated: Yes     HDL Cholesterol: 60 MG/DL     Total Cholesterol: 162 MG/DL  Assessment/Plan:   Essential  hypertension. Mercedees is working on healthy weight loss and exercise to improve blood pressure control. We will watch for signs of hypotension as she continues her lifestyle modifications. Refills were given for chlorthalidone (HYGROTON) 25 MG tablet #90 with 0 refills and losartan (COZAAR) 100 MG tablet #90 with 0 refills.  Other hyperlipidemia.  Class 2 severe obesity with serious comorbidity and body mass index (BMI) of 36.0 to 36.9 in adult, unspecified obesity type ([place]).  Annielee is currently in the action stage of change. As such, her goal is to continue with weight loss efforts. She has agreed to the Category 2 Plan.   Exercise goals: Older adults should follow the adult guidelines. When older adults cannot meet the adult guidelines, they should be as physically active as their abilities and conditions will allow.   Behavioral modification strategies: increasing lean protein intake, no skipping meals and travel eating strategies.  Amen has agreed to follow-up with our clinic in 3 weeks. She was informed of the importance of frequent follow-up visits to maximize her success with intensive lifestyle modifications for her multiple health conditions.   Objective:   Blood pressure (!) 159/82, pulse 97, temperature 98.7 F (37.1 C), temperature source Oral, height 5\' 3"  (1.6 m), weight 208 lb (94.3 kg), SpO2 97 %.  Body mass index is 36.85 kg/m.  General: Cooperative, alert, well developed, in no acute distress. HEENT: Conjunctivae and lids unremarkable. Cardiovascular: Regular rhythm.  Lungs: Normal work of breathing. Neurologic: No focal deficits.   Lab Results  Component Value Date   CREATININE 0.99 06/05/2019   BUN 15 06/05/2019   NA 145 (H) 06/05/2019   K 3.6 06/05/2019   CL 103 06/05/2019   CO2 30 (H) 06/05/2019   Lab Results  Component Value Date   ALT 16 06/05/2019   AST 19 06/05/2019   ALKPHOS 49 06/05/2019   BILITOT 0.5 06/05/2019   Lab Results  Component Value Date     HGBA1C 5.9 (H) 06/05/2019   HGBA1C 6.1 (H) 09/29/2018   HGBA1C 5.9 (H) 05/05/2018   HGBA1C 6.0 (H) 12/21/2017   HGBA1C 6.5 (H) 08/31/2017   Lab Results  Component Value Date   INSULIN 22.5 06/05/2019   INSULIN 19.0 09/29/2018   INSULIN 17.2 05/05/2018   INSULIN 17.7 12/21/2017   INSULIN 20.4 08/31/2017   Lab Results  Component Value Date   TSH 1.680 08/31/2017   Lab Results  Component Value Date   CHOL 141 06/05/2019   HDL 56 06/05/2019   LDLCALC 66 06/05/2019   TRIG 104 06/05/2019   CHOLHDL 2.6 08/31/2010   Lab Results  Component Value Date   WBC 4.4 09/12/2017   HGB 15.0 09/12/2017   HCT 43.7 09/12/2017   MCV 92.6 09/12/2017   PLT 173 09/12/2017   No results found for: IRON, TIBC, FERRITIN  Obesity Behavioral Intervention Documentation for Insurance:   Approximately 15 minutes were spent on the discussion below.  ASK: We discussed the diagnosis of obesity with [name] today and Ariellah agreed to give [name] permission to discuss obesity behavioral modification therapy today.  ASSESS: Sani has the diagnosis of obesity and her BMI today is 36.9. Tayliana is in the action stage of change.   ADVISE: Zelta was educated on the multiple health risks of obesity as well as the benefit of weight loss to improve her health. She was advised of the need for long term treatment and the importance of lifestyle modifications to improve her current health and to decrease her risk of future health problems.  AGREE: Multiple dietary modification options and treatment options were discussed and Glyn agreed to follow the recommendations documented in the above note.  ARRANGE: Mylee was educated on the importance of frequent visits to treat obesity as outlined per CMS and USPSTF guidelines and agreed to schedule her next follow up appointment today.  Attestation Statements:   Reviewed by clinician on day of visit: allergies, medications, problem list, medical history, surgical history, family  history, social history, and previous encounter notes.  IMichaelene Song, am acting as transcriptionist for Abby Potash, PA-C   I have reviewed the above documentation for accuracy and completeness, and I agree with the above. Abby Potash, PA-C

## 2019-12-14 ENCOUNTER — Encounter (INDEPENDENT_AMBULATORY_CARE_PROVIDER_SITE_OTHER): Payer: Self-pay | Admitting: Physician Assistant

## 2019-12-14 ENCOUNTER — Ambulatory Visit (INDEPENDENT_AMBULATORY_CARE_PROVIDER_SITE_OTHER): Payer: Medicare HMO | Admitting: Physician Assistant

## 2019-12-14 ENCOUNTER — Other Ambulatory Visit: Payer: Self-pay

## 2019-12-14 VITALS — BP 144/80 | HR 91 | Temp 98.6°F | Ht 63.0 in | Wt 207.0 lb

## 2019-12-14 DIAGNOSIS — Z6836 Body mass index (BMI) 36.0-36.9, adult: Secondary | ICD-10-CM | POA: Diagnosis not present

## 2019-12-14 DIAGNOSIS — E1169 Type 2 diabetes mellitus with other specified complication: Secondary | ICD-10-CM | POA: Diagnosis not present

## 2019-12-14 DIAGNOSIS — E785 Hyperlipidemia, unspecified: Secondary | ICD-10-CM | POA: Diagnosis not present

## 2019-12-14 DIAGNOSIS — J302 Other seasonal allergic rhinitis: Secondary | ICD-10-CM

## 2019-12-14 DIAGNOSIS — E559 Vitamin D deficiency, unspecified: Secondary | ICD-10-CM

## 2019-12-14 MED ORDER — FLUTICASONE PROPIONATE 50 MCG/ACT NA SUSP: 2.0000 | Freq: Every day | NASAL | 5 refills | Status: DC | PRN

## 2019-12-14 NOTE — Progress Notes (Signed)
Chief Complaint:   OBESITY Molly [name] is here to discuss her progress with her obesity treatment plan along with follow-up of her obesity related diagnoses. Molly [name] is on the Category 2 Plan and states she is following her eating plan approximately 80% of the time. Molly [name] states she is walking for 10-15 minutes 2-3 times per week.  Today's visit was #: 108 Starting weight: 223 lbs Starting date: 08/31/2017 Today's weight: 207 lbs Today's date: 12/14/2019 Total lbs lost to date: 24 Total lbs lost since last in-office visit: 1  Interim History: Molly [name] reports that she returned from the beach, where she ate on the plan every day except the last 2 days. She is having oral surgery next week.  Subjective:   1. Vitamin D deficiency Molly [name] is on OTC Vit D, and she denies nausea, vomiting, or muscle weakness. She is due for labs.  2. Seasonal allergies Molly [name] is not taking Flonase, as she ran out. She notes runny nose and itchy eyes.  3. Type 2 diabetes mellitus associated with hyperlipidemia (H[facility]) Molly [name] is on metformin, and she denies nausea, vomiting, diarrhea, or polyphagia. Last A1c was 5.9. She is due for labs.  Assessment/Plan:   1. Vitamin D deficiency Low Vitamin D level contributes to fatigue and are associated with obesity, breast, and colon cancer. Molly [name] agreed to continue taking OTC Vit D and will follow-up for routine testing of Vitamin D, at least 2-3 times per year to avoid over-replacement. We will check labs today.  - VITAMIN D 25 Hydroxy (Vit-D Deficiency, Fractures)  2. Seasonal allergies We will refill Flonase for 2 months, and Molly [name] will follow as directed.  - fluticasone (FLONASE) 50 MCG/ACT nasal spray; Place 2 sprays into both nostrils daily as needed for allergies or rhinitis.  Dispense: 16 g; Refill: 5  3. Hyperlipidemia associated with type 2 diabetes mellitus (Molly [name]) Cardiovascular risk and specific lipid/LDL goals reviewed. We discussed several lifestyle modifications today  and Molly [name] will continue to work on diet, exercise and weight loss efforts. We will check labs today. Orders and follow up as documented in patient record.   Counseling Intensive lifestyle modifications are the first line treatment for this issue. . Dietary changes: Increase soluble fiber. Decrease simple carbohydrates. . Exercise changes: Moderate to vigorous-intensity aerobic activity 150 minutes per week if tolerated. . Lipid-lowering medications: see documented in medical record.  - Comprehensive metabolic panel - Hemoglobin A1c - Insulin, random - Lipid Panel With LDL/HDL Ratio  4. Class 2 severe obesity with serious comorbidity and body mass index (BMI) of 36.0 to 36.9 in adult, unspecified obesity type Molly [name]) Molly [name] is currently in the action stage of change. As such, her goal is to continue with weight loss efforts. She has agreed to the Category 2 Plan.   Exercise goals: As is.  Behavioral modification strategies: meal planning and cooking strategies and keeping healthy foods in the home.  Molly [name] has agreed to follow-up with our [name] in 3 weeks. She was informed of the importance of frequent follow-up visits to maximize her success with intensive lifestyle modifications for her multiple health conditions.   Molly [name] was informed we would discuss her lab results at her next visit unless there is a critical issue that needs to be addressed sooner. Molly [name] agreed to keep her next visit at the agreed upon time to discuss these results.  Objective:   Blood pressure (!) 144/80, pulse 91, temperature 98.6 F (37 C), temperature source Oral, height 5\' 3"  (1.6 m), weight  207 lb (93.9 kg), SpO2 99 %. Body mass index is 36.67 kg/m.  General: Cooperative, alert, well developed, in no acute distress. HEENT: Conjunctivae and lids unremarkable. Cardiovascular: Regular rhythm.  Lungs: Normal work of breathing. Neurologic: No focal deficits.   Lab Results  Component Value Date   CREATININE 0.99  06/05/2019   BUN 15 06/05/2019   NA 145 (H) 06/05/2019   K 3.6 06/05/2019   CL 103 06/05/2019   CO2 30 (H) 06/05/2019   Lab Results  Component Value Date   ALT 16 06/05/2019   AST 19 06/05/2019   ALKPHOS 49 06/05/2019   BILITOT 0.5 06/05/2019   Lab Results  Component Value Date   HGBA1C 5.9 (H) 06/05/2019   HGBA1C 6.1 (H) 09/29/2018   HGBA1C 5.9 (H) 05/05/2018   HGBA1C 6.0 (H) 12/21/2017   HGBA1C 6.5 (H) 08/31/2017   Lab Results  Component Value Date   INSULIN 22.5 06/05/2019   INSULIN 19.0 09/29/2018   INSULIN 17.2 05/05/2018   INSULIN 17.7 12/21/2017   INSULIN 20.4 08/31/2017   Lab Results  Component Value Date   TSH 1.680 08/31/2017   Lab Results  Component Value Date   CHOL 141 06/05/2019   HDL 56 06/05/2019   LDLCALC 66 06/05/2019   TRIG 104 06/05/2019   CHOLHDL 2.6 08/31/2010   Lab Results  Component Value Date   WBC 4.4 09/12/2017   HGB 15.0 09/12/2017   HCT 43.7 09/12/2017   MCV 92.6 09/12/2017   PLT 173 09/12/2017   No results found for: IRON, TIBC, FERRITIN  Obesity Behavioral Intervention Documentation for Insurance:   Approximately 15 minutes were spent on the discussion below.  ASK: We discussed the diagnosis of obesity with Molly [name] today and Molly [name] agreed to give [name] permission to discuss obesity behavioral modification therapy today.  ASSESS: Molly [name] has the diagnosis of obesity and her BMI today is 36.68. Molly [name] is in the action stage of change.   ADVISE: Molly [name] was educated on the multiple health risks of obesity as well as the benefit of weight loss to improve her health. She was advised of the need for long term treatment and the importance of lifestyle modifications to improve her current health and to decrease her risk of future health problems.  AGREE: Multiple dietary modification options and treatment options were discussed and Molly [name] agreed to follow the recommendations documented in the above note.  ARRANGE: Molly [name] was educated on the  importance of frequent visits to treat obesity as outlined per CMS and USPSTF guidelines and agreed to schedule her next follow up appointment today.  Attestation Statements:   Reviewed by clinician on day of visit: allergies, medications, problem list, medical history, surgical history, family history, social history, and previous encounter notes.   Wilhemena Durie, am acting as transcriptionist for [employer], PA-C.  I have reviewed the above documentation for accuracy and completeness, and I agree with the above. Abby Potash, PA-C

## 2019-12-15 LAB — LIPID PANEL WITH LDL/HDL RATIO
Cholesterol, Total: 155 mg/dL (ref 100–199)
HDL: 61 mg/dL (ref >39)
LDL Chol Calc (NIH): 75 mg/dL (ref 0–99)
LDL/HDL Ratio: 1.2 ratio (ref 0.0–3.2)
Triglycerides: 103 mg/dL (ref 0–149)
VLDL Cholesterol Cal: 19 mg/dL (ref 5–40)

## 2019-12-15 LAB — COMPREHENSIVE METABOLIC PANEL
ALT: 16 IU/L (ref 0–32)
AST: 21 IU/L (ref 0–40)
Albumin/Globulin Ratio: 1.7 (ref 1.2–2.2)
Albumin: 4.8 g/dL — ABNORMAL HIGH (ref 3.7–4.7)
Alkaline Phosphatase: 50 IU/L (ref 48–121)
BUN/Creatinine Ratio: 14 (ref 12–28)
BUN: 14 mg/dL (ref 8–27)
Bilirubin Total: 0.5 mg/dL (ref 0.0–1.2)
CO2: 28 mmol/L (ref 20–29)
Calcium: 10.8 mg/dL — ABNORMAL HIGH (ref 8.7–10.3)
Chloride: 99 mmol/L (ref 96–106)
Creatinine, Ser: 1 mg/dL (ref 0.57–1.00)
GFR calc Af Amer: 64 mL/min/{1.73_m2} (ref >59)
GFR calc non Af Amer: 56 mL/min/{1.73_m2} — ABNORMAL LOW (ref >59)
Globulin, Total: 2.8 g/dL (ref 1.5–4.5)
Glucose: 101 mg/dL — ABNORMAL HIGH (ref 65–99)
Potassium: 3.5 mmol/L (ref 3.5–5.2)
Sodium: 143 mmol/L (ref 134–144)
Total Protein: 7.6 g/dL (ref 6.0–8.5)

## 2019-12-15 LAB — HEMOGLOBIN A1C
Est. average glucose Bld gHb Est-mCnc: 123 mg/dL
Hgb A1c MFr Bld: 5.9 % — ABNORMAL HIGH (ref 4.8–5.6)

## 2019-12-15 LAB — INSULIN, RANDOM: INSULIN: 16 u[IU]/mL (ref 2.6–24.9)

## 2019-12-15 LAB — VITAMIN D 25 HYDROXY (VIT D DEFICIENCY, FRACTURES): Vit D, 25-Hydroxy: 50.7 ng/mL (ref 30.0–100.0)

## 2020-01-04 ENCOUNTER — Encounter (INDEPENDENT_AMBULATORY_CARE_PROVIDER_SITE_OTHER): Payer: Self-pay | Admitting: Physician Assistant

## 2020-01-04 ENCOUNTER — Other Ambulatory Visit: Payer: Self-pay

## 2020-01-04 ENCOUNTER — Ambulatory Visit (INDEPENDENT_AMBULATORY_CARE_PROVIDER_SITE_OTHER): Payer: Medicare HMO | Admitting: Physician Assistant

## 2020-01-04 VITALS — BP 132/82 | HR 95 | Temp 98.6°F | Ht 63.0 in | Wt 206.0 lb

## 2020-01-04 DIAGNOSIS — E1122 Type 2 diabetes mellitus with diabetic chronic kidney disease: Secondary | ICD-10-CM

## 2020-01-04 DIAGNOSIS — Z6836 Body mass index (BMI) 36.0-36.9, adult: Secondary | ICD-10-CM

## 2020-01-04 DIAGNOSIS — E559 Vitamin D deficiency, unspecified: Secondary | ICD-10-CM | POA: Diagnosis not present

## 2020-01-04 NOTE — Progress Notes (Signed)
Chief Complaint:   OBESITY Molly [name] is here to discuss her progress with her obesity treatment plan along with follow-up of her obesity related diagnoses. Yazlin is following a lower carbohydrate, vegetable and lean protein rich diet plan and states she is following her eating plan approximately 80% of the time. Leonora states she is exercising 0 minutes 0 times per week.  Today's visit was #: 28 Starting weight: 223 lbs Starting date: 08/31/2017 Today's weight: 206 lbs Today's date: 01/04/2020 Total lbs lost to date: 17 Total lbs lost since last in-office visit: 1  Interim History: Kimaria reports that she did well until [facility] Day and attending graduation parties. She is making protein substitutions when needed.  Subjective:   Type 2 diabetes mellitus with chronic kidney disease, without long-term current use of insulin, unspecified CKD stage ([place]). Nusrat is on metformin. No nausea, vomiting, or diarrhea. No hypoglycemia or polyphagia.   Lab Results  Component Value Date   HGBA1C 5.9 (H) 12/14/2019   HGBA1C 5.9 (H) 06/05/2019   HGBA1C 6.1 (H) 09/29/2018   Lab Results  Component Value Date   LDLCALC 75 12/14/2019   CREATININE 1.00 12/14/2019   Lab Results  Component Value Date   INSULIN 16.0 12/14/2019   INSULIN 22.5 06/05/2019   INSULIN 19.0 09/29/2018   INSULIN 17.2 05/05/2018   INSULIN 17.7 12/21/2017   Vitamin D deficiency. Last Vitamin D was 50.7 on 12/14/2019. Yeni is on Vitamin D daily, but only taking it 3 times per week.  Assessment/Plan:   Type 2 diabetes mellitus with chronic kidney disease, without long-term current use of insulin, unspecified CKD stage ([place]). Good blood sugar control is important to decrease the likelihood of diabetic complications such as nephropathy, neuropathy, limb loss, blindness, coronary artery disease, and death. Intensive lifestyle modification including diet, exercise and weight loss are the first line of treatment for diabetes.  Naava will continue her medication as directed.  Vitamin D deficiency. Low Vitamin D level contributes to fatigue and are associated with obesity, breast, and colon cancer. She will take Vitamin D 5,000 units every other day and will follow-up for routine testing of Vitamin D, at least 2-3 times per year to avoid over-replacement.  Class 2 severe obesity with serious comorbidity and body mass index (BMI) of 36.0 to 36.9 in adult, unspecified obesity type ([place]).  Deya is currently in the action stage of change. As such, her goal is to continue with weight loss efforts. She has agreed to the Category 2 Plan.   Exercise goals: Older adults should follow the adult guidelines. When older adults cannot meet the adult guidelines, they should be as physically active as their abilities and conditions will allow.   Behavioral modification strategies: increasing water intake and meal planning and cooking strategies.  Reed has agreed to follow-up with our clinic in 3 weeks. She was informed of the importance of frequent follow-up visits to maximize her success with intensive lifestyle modifications for her multiple health conditions.   Objective:   Blood pressure 132/82, pulse 95, temperature 98.6 F (37 C), temperature source Oral, height 5\' 3"  (1.6 m), weight 206 lb (93.4 kg), SpO2 96 %. Body mass index is 36.49 kg/m.  General: Cooperative, alert, well developed, in no acute distress. HEENT: Conjunctivae and lids unremarkable. Cardiovascular: Regular rhythm.  Lungs: Normal work of breathing. Neurologic: No focal deficits.   Lab Results  Component Value Date   CREATININE 1.00 12/14/2019   BUN 14 12/14/2019   NA  143 12/14/2019   K 3.5 12/14/2019   CL 99 12/14/2019   CO2 28 12/14/2019   Lab Results  Component Value Date   ALT 16 12/14/2019   AST 21 12/14/2019   ALKPHOS 50 12/14/2019   BILITOT 0.5 12/14/2019   Lab Results  Component Value Date   HGBA1C 5.9 (H) 12/14/2019   HGBA1C 5.9  (H) 06/05/2019   HGBA1C 6.1 (H) 09/29/2018   HGBA1C 5.9 (H) 05/05/2018   HGBA1C 6.0 (H) 12/21/2017   Lab Results  Component Value Date   INSULIN 16.0 12/14/2019   INSULIN 22.5 06/05/2019   INSULIN 19.0 09/29/2018   INSULIN 17.2 05/05/2018   INSULIN 17.7 12/21/2017   Lab Results  Component Value Date   TSH 1.680 08/31/2017   Lab Results  Component Value Date   CHOL 155 12/14/2019   HDL 61 12/14/2019   LDLCALC 75 12/14/2019   TRIG 103 12/14/2019   CHOLHDL 2.6 08/31/2010   Lab Results  Component Value Date   WBC 4.4 09/12/2017   HGB 15.0 09/12/2017   HCT 43.7 09/12/2017   MCV 92.6 09/12/2017   PLT 173 09/12/2017   No results found for: IRON, TIBC, FERRITIN  Obesity Behavioral Intervention Documentation for Insurance:   Approximately 15 minutes were spent on the discussion below.  ASK: We discussed the diagnosis of obesity with [name] today and Shayla agreed to give [name] permission to discuss obesity behavioral modification therapy today.  ASSESS: Jillane has the diagnosis of obesity and her BMI today is 36.6. Kyrra is in the action stage of change.   ADVISE: Lowen was educated on the multiple health risks of obesity as well as the benefit of weight loss to improve her health. She was advised of the need for long term treatment and the importance of lifestyle modifications to improve her current health and to decrease her risk of future health problems.  AGREE: Multiple dietary modification options and treatment options were discussed and Lile agreed to follow the recommendations documented in the above note.  ARRANGE: Joella was educated on the importance of frequent visits to treat obesity as outlined per CMS and USPSTF guidelines and agreed to schedule her next follow up appointment today.  Attestation Statements:   Reviewed by clinician on day of visit: allergies, medications, problem list, medical history, surgical history, family history, social history, and previous  encounter notes.  IMichaelene Song, am acting as transcriptionist for Abby Potash, PA-C   I have reviewed the above documentation for accuracy and completeness, and I agree with the above. Abby Potash, PA-C

## 2020-01-08 DIAGNOSIS — R69 Illness, unspecified: Secondary | ICD-10-CM | POA: Diagnosis not present

## 2020-02-01 ENCOUNTER — Ambulatory Visit (INDEPENDENT_AMBULATORY_CARE_PROVIDER_SITE_OTHER): Payer: Medicare HMO | Admitting: Physician Assistant

## 2020-02-13 ENCOUNTER — Other Ambulatory Visit (INDEPENDENT_AMBULATORY_CARE_PROVIDER_SITE_OTHER): Payer: Self-pay | Admitting: Physician Assistant

## 2020-02-13 DIAGNOSIS — I10 Essential (primary) hypertension: Secondary | ICD-10-CM

## 2020-02-26 ENCOUNTER — Other Ambulatory Visit: Payer: Self-pay

## 2020-02-26 ENCOUNTER — Ambulatory Visit (INDEPENDENT_AMBULATORY_CARE_PROVIDER_SITE_OTHER): Payer: Medicare HMO | Admitting: Physician Assistant

## 2020-02-26 ENCOUNTER — Encounter (INDEPENDENT_AMBULATORY_CARE_PROVIDER_SITE_OTHER): Payer: Self-pay | Admitting: Physician Assistant

## 2020-02-26 VITALS — BP 132/84 | HR 98 | Temp 98.7°F | Ht 63.0 in | Wt 208.0 lb

## 2020-02-26 DIAGNOSIS — I10 Essential (primary) hypertension: Secondary | ICD-10-CM

## 2020-02-26 DIAGNOSIS — E1169 Type 2 diabetes mellitus with other specified complication: Secondary | ICD-10-CM

## 2020-02-26 DIAGNOSIS — Z6837 Body mass index (BMI) 37.0-37.9, adult: Secondary | ICD-10-CM

## 2020-02-26 DIAGNOSIS — E782 Mixed hyperlipidemia: Secondary | ICD-10-CM | POA: Diagnosis not present

## 2020-02-26 MED ORDER — LOSARTAN POTASSIUM 100 MG PO TABS: 100.0000 mg | ORAL_TABLET | Freq: Every day | ORAL | 0 refills | Status: DC

## 2020-02-26 MED ORDER — CHLORTHALIDONE 25 MG PO TABS: 25.0000 mg | ORAL_TABLET | Freq: Every day | ORAL | 0 refills | Status: DC

## 2020-02-27 NOTE — Progress Notes (Signed)
Chief Complaint:   OBESITY Molly [name] is here to discuss her progress with her obesity treatment plan along with follow-up of her obesity related diagnoses. Molly [name] is following a lower carbohydrate, vegetable and lean protein rich diet plan and states she is following her eating plan approximately 50% of the time. Molly [name] states she is walking 15-20 minutes 2 times per week.  Today's visit was #: 89 Starting weight: 223 lbs Starting date: 08/31/2017 Today's weight: 208 lbs Today's date: 02/26/2020 Total lbs lost to date: 15 Total lbs lost since last in-office visit: 0  Interim History: Molly [name] recently had oral surgery and had to eat soft foods. She is now back to eating regular foods.  She ran out of her chlorthalidone and is retaining fluid today.  Subjective:   Essential hypertension. Blood pressure is controlled. She is tolerating her medications.  BP Readings from Last 3 Encounters:  02/26/20 132/84  01/04/20 132/82  12/14/19 (!) 144/80   Lab Results  Component Value Date   CREATININE 1.00 12/14/2019   CREATININE 0.99 06/05/2019   CREATININE 0.95 09/29/2018   DM type 2 with diabetic with mixed hyperlipidemia ([place]). Molly [name] is on metformin. No nausea, vomiting, or diarrhea. Fasting blood sugars average between 102 and 110. No hypoglycemia.  Lab Results  Component Value Date   HGBA1C 5.9 (H) 12/14/2019   HGBA1C 5.9 (H) 06/05/2019   HGBA1C 6.1 (H) 09/29/2018   Lab Results  Component Value Date   LDLCALC 75 12/14/2019   CREATININE 1.00 12/14/2019   Lab Results  Component Value Date   INSULIN 16.0 12/14/2019   INSULIN 22.5 06/05/2019   INSULIN 19.0 09/29/2018   INSULIN 17.2 05/05/2018   INSULIN 17.7 12/21/2017   Assessment/Plan:   Essential hypertension. Molly [name] is working on healthy weight loss and exercise to improve blood pressure control. We will watch for signs of hypotension as she continues her lifestyle modifications. Refills were given for chlorthalidone  (HYGROTON) 25 MG tablet #30 with 0 refills and losartan (COZAAR) 100 MG tablet #30. Her PCP will take over hypertension management.  DM type 2 with diabetic with mixed hyperlipidemia ([place]). Good blood sugar control is important to decrease the likelihood of diabetic complications such as nephropathy, neuropathy, limb loss, blindness, coronary artery disease, and death. Intensive lifestyle modification including diet, exercise and weight loss are the first line of treatment for diabetes. Molly [name] will continue her medication as directed.   Class 2 severe obesity with serious comorbidity and body mass index (BMI) of 37.0 to 37.9 in adult, unspecified obesity type ([place]).  Molly [name] is currently in the action stage of change. As such, her goal is to continue with weight loss efforts. She has agreed to the Category 2 Plan.   Exercise goals: Older adults should follow the adult guidelines. When older adults cannot meet the adult guidelines, they should be as physically active as their abilities and conditions will allow.   Behavioral modification strategies: increasing lean protein intake and no skipping meals.  Molly [name] has agreed to follow-up with our clinic in 3 weeks. She was informed of the importance of frequent follow-up visits to maximize her success with intensive lifestyle modifications for her multiple health conditions.   Objective:   Blood pressure 132/84, pulse 98, temperature 98.7 F (37.1 C), temperature source Oral, height 5\' 3"  (1.6 m), weight 208 lb (94.3 kg), SpO2 99 %. Body mass index is 36.85 kg/m.  General: Cooperative, alert, well developed, in no acute distress. HEENT: Conjunctivae and lids  unremarkable. Cardiovascular: Regular rhythm.  Lungs: Normal work of breathing. Neurologic: No focal deficits.   Lab Results  Component Value Date   CREATININE 1.00 12/14/2019   BUN 14 12/14/2019   NA 143 12/14/2019   K 3.5 12/14/2019   CL 99 12/14/2019   CO2 28 12/14/2019   Lab Results    Component Value Date   ALT 16 12/14/2019   AST 21 12/14/2019   ALKPHOS 50 12/14/2019   BILITOT 0.5 12/14/2019   Lab Results  Component Value Date   HGBA1C 5.9 (H) 12/14/2019   HGBA1C 5.9 (H) 06/05/2019   HGBA1C 6.1 (H) 09/29/2018   HGBA1C 5.9 (H) 05/05/2018   HGBA1C 6.0 (H) 12/21/2017   Lab Results  Component Value Date   INSULIN 16.0 12/14/2019   INSULIN 22.5 06/05/2019   INSULIN 19.0 09/29/2018   INSULIN 17.2 05/05/2018   INSULIN 17.7 12/21/2017   Lab Results  Component Value Date   TSH 1.680 08/31/2017   Lab Results  Component Value Date   CHOL 155 12/14/2019   HDL 61 12/14/2019   LDLCALC 75 12/14/2019   TRIG 103 12/14/2019   CHOLHDL 2.6 08/31/2010   Lab Results  Component Value Date   WBC 4.4 09/12/2017   HGB 15.0 09/12/2017   HCT 43.7 09/12/2017   MCV 92.6 09/12/2017   PLT 173 09/12/2017   No results found for: IRON, TIBC, FERRITIN  Obesity Behavioral Intervention Documentation for Insurance:   Approximately 15 minutes were spent on the discussion below.  ASK: We discussed the diagnosis of obesity with Molly [name] today and Molly [name] agreed to give [name] permission to discuss obesity behavioral modification therapy today.  ASSESS: Molly [name] has the diagnosis of obesity and her BMI today is 37.0. Molly [name] is in the action stage of change.   ADVISE: Molly [name] was educated on the multiple health risks of obesity as well as the benefit of weight loss to improve her health. She was advised of the need for long term treatment and the importance of lifestyle modifications to improve her current health and to decrease her risk of future health problems.  AGREE: Multiple dietary modification options and treatment options were discussed and Molly [name] agreed to follow the recommendations documented in the above note.  ARRANGE: Molly [name] was educated on the importance of frequent visits to treat obesity as outlined per CMS and USPSTF guidelines and agreed to schedule her next follow up appointment  today.  Attestation Statements:   Reviewed by clinician on day of visit: allergies, medications, problem list, medical history, surgical history, family history, social history, and previous encounter notes.  IMichaelene Song, am acting as transcriptionist for Abby Potash, PA-C   I have reviewed the above documentation for accuracy and completeness, and I agree with the above. Abby Potash, PA-C

## 2020-03-18 ENCOUNTER — Ambulatory Visit (INDEPENDENT_AMBULATORY_CARE_PROVIDER_SITE_OTHER): Payer: Medicare HMO | Admitting: Physician Assistant

## 2020-03-18 ENCOUNTER — Encounter (INDEPENDENT_AMBULATORY_CARE_PROVIDER_SITE_OTHER): Payer: Self-pay | Admitting: Physician Assistant

## 2020-03-18 ENCOUNTER — Other Ambulatory Visit: Payer: Self-pay

## 2020-03-18 VITALS — BP 137/83 | HR 94 | Temp 98.2°F | Ht 63.0 in | Wt 206.0 lb

## 2020-03-18 DIAGNOSIS — E1169 Type 2 diabetes mellitus with other specified complication: Secondary | ICD-10-CM | POA: Diagnosis not present

## 2020-03-18 DIAGNOSIS — Z6836 Body mass index (BMI) 36.0-36.9, adult: Secondary | ICD-10-CM

## 2020-03-18 DIAGNOSIS — E559 Vitamin D deficiency, unspecified: Secondary | ICD-10-CM | POA: Diagnosis not present

## 2020-03-18 DIAGNOSIS — E782 Mixed hyperlipidemia: Secondary | ICD-10-CM | POA: Diagnosis not present

## 2020-03-19 NOTE — Progress Notes (Signed)
Chief Complaint:   OBESITY Molly [name] is here to discuss her progress with her obesity treatment plan along with follow-up of her obesity related diagnoses. Molly [name] is on the Category 2 Plan and states she is following her eating plan approximately 85% of the time. Molly [name] states she is walking and using resistance bands for 15-20 minutes 2-3 times per week.  Today's visit was #: 70 Starting weight: 223 lbs Starting date: 02/26/2020 Today's weight: 206 lbs Today's date: 03/18/2020 Total lbs lost to date: 17 lbs Total lbs lost since last in-office visit: 2 lbs  Interim History: Molly [name] states that she attended a couple of celebrations and overindulged in some foods.  She also drank juice on some occasions.  She is trying to get more protein in.  Subjective:   1. DM type 2 with diabetic with mixed hyperlipidemia (HCC) Medications reviewed. Diabetic ROS: no polyuria or polydipsia, no chest pain, dyspnea or TIA's, no numbness, tingling or pain in extremities.  Fasting blood sugars range from 90-110.  She is on metformin with no nausea, vomiting, or diarrhea.  No hypoglycemia.  Lab Results  Component Value Date   HGBA1C 5.9 (H) 12/14/2019   HGBA1C 5.9 (H) 06/05/2019   HGBA1C 6.1 (H) 09/29/2018   Lab Results  Component Value Date   LDLCALC 75 12/14/2019   CREATININE 1.00 12/14/2019   Lab Results  Component Value Date   INSULIN 16.0 12/14/2019   INSULIN 22.5 06/05/2019   INSULIN 19.0 09/29/2018   INSULIN 17.2 05/05/2018   INSULIN 17.7 12/21/2017   2. Vitamin D deficiency Molly [name]'s Vitamin D level was 50.7 on 12/14/2019. She is currently taking prescription vitamin D 50,000 IU each week. She denies nausea, vomiting or muscle weakness.   Assessment/Plan:   1. DM type 2 with diabetic with mixed hyperlipidemia (Molly [name]) Good blood sugar control is important to decrease the likelihood of diabetic complications such as nephropathy, neuropathy, limb loss, blindness, coronary artery disease, and death.  Intensive lifestyle modification including diet, exercise and weight loss are the first line of treatment for diabetes.  Continue with medications and weight loss.  2. Vitamin D deficiency Low Vitamin D level contributes to fatigue and are associated with obesity, breast, and colon cancer. She agrees to continue to take prescription Vitamin D @50 ,000 IU every week and will follow-up for routine testing of Vitamin D, at least 2-3 times per year to avoid over-replacement.  3. Class 2 severe obesity with serious comorbidity and body mass index (BMI) of 36.0 to 36.9 in adult, unspecified obesity type Molly [name]) Molly [name] is currently in the action stage of change. As such, her goal is to continue with weight loss efforts. She has agreed to the Category 2 Plan.   Exercise goals: As is.  Behavioral modification strategies: no skipping meals and planning for success.  Molly [name] has agreed to follow-up with our clinic in 3 weeks. She was informed of the importance of frequent follow-up visits to maximize her success with intensive lifestyle modifications for her multiple health conditions.   Objective:   Blood pressure 137/83, pulse 94, temperature 98.2 F (36.8 C), temperature source Oral, height 5\' 3"  (1.6 m), weight 206 lb (93.4 kg), SpO2 99 %. Body mass index is 36.49 kg/m.  General: Cooperative, alert, well developed, in no acute distress. HEENT: Conjunctivae and lids unremarkable. Cardiovascular: Regular rhythm.  Lungs: Normal work of breathing. Neurologic: No focal deficits.   Lab Results  Component Value Date   CREATININE 1.00 12/14/2019   BUN  14 12/14/2019   NA 143 12/14/2019   K 3.5 12/14/2019   CL 99 12/14/2019   CO2 28 12/14/2019   Lab Results  Component Value Date   ALT 16 12/14/2019   AST 21 12/14/2019   ALKPHOS 50 12/14/2019   BILITOT 0.5 12/14/2019   Lab Results  Component Value Date   HGBA1C 5.9 (H) 12/14/2019   HGBA1C 5.9 (H) 06/05/2019   HGBA1C 6.1 (H) 09/29/2018   HGBA1C  5.9 (H) 05/05/2018   HGBA1C 6.0 (H) 12/21/2017   Lab Results  Component Value Date   INSULIN 16.0 12/14/2019   INSULIN 22.5 06/05/2019   INSULIN 19.0 09/29/2018   INSULIN 17.2 05/05/2018   INSULIN 17.7 12/21/2017   Lab Results  Component Value Date   TSH 1.680 08/31/2017   Lab Results  Component Value Date   CHOL 155 12/14/2019   HDL 61 12/14/2019   LDLCALC 75 12/14/2019   TRIG 103 12/14/2019   CHOLHDL 2.6 08/31/2010   Lab Results  Component Value Date   WBC 4.4 09/12/2017   HGB 15.0 09/12/2017   HCT 43.7 09/12/2017   MCV 92.6 09/12/2017   PLT 173 09/12/2017   No results found for: IRON, TIBC, FERRITIN  Obesity Behavioral Intervention Documentation for Insurance:   Approximately 15 minutes were spent on the discussion below.  ASK: We discussed the diagnosis of obesity with Molly [name] today and Molly [name] agreed to give [name] permission to discuss obesity behavioral modification therapy today.  ASSESS: Molly [name] has the diagnosis of obesity and her BMI today is 36.6. Molly [name] is in the action stage of change.   ADVISE: Molly [name] was educated on the multiple health risks of obesity as well as the benefit of weight loss to improve her health. She was advised of the need for long term treatment and the importance of lifestyle modifications to improve her current health and to decrease her risk of future health problems.  AGREE: Multiple dietary modification options and treatment options were discussed and Molly [name] agreed to follow the recommendations documented in the above note.  ARRANGE: Molly [name] was educated on the importance of frequent visits to treat obesity as outlined per CMS and USPSTF guidelines and agreed to schedule her next follow up appointment today.  Attestation Statements:   Reviewed by clinician on day of visit: allergies, medications, problem list, medical history, surgical history, family history, social history, and previous encounter notes.  I, Water quality scientist, CMA, am acting as  transcriptionist for Abby Potash, PA-C  I have reviewed the above documentation for accuracy and completeness, and I agree with the above. Abby Potash, PA-C

## 2020-04-08 ENCOUNTER — Other Ambulatory Visit: Payer: Self-pay

## 2020-04-08 ENCOUNTER — Ambulatory Visit (INDEPENDENT_AMBULATORY_CARE_PROVIDER_SITE_OTHER): Payer: Medicare HMO | Admitting: Physician Assistant

## 2020-04-08 ENCOUNTER — Encounter (INDEPENDENT_AMBULATORY_CARE_PROVIDER_SITE_OTHER): Payer: Self-pay | Admitting: Physician Assistant

## 2020-04-08 VITALS — BP 121/81 | HR 93 | Temp 98.0°F | Ht 63.0 in | Wt 208.0 lb

## 2020-04-08 DIAGNOSIS — Z6837 Body mass index (BMI) 37.0-37.9, adult: Secondary | ICD-10-CM | POA: Diagnosis not present

## 2020-04-08 DIAGNOSIS — I10 Essential (primary) hypertension: Secondary | ICD-10-CM | POA: Diagnosis not present

## 2020-04-08 DIAGNOSIS — E1169 Type 2 diabetes mellitus with other specified complication: Secondary | ICD-10-CM

## 2020-04-08 MED ORDER — GLUCOSE BLOOD VI STRP: ORAL_STRIP | 0 refills | Status: DC

## 2020-04-08 NOTE — Progress Notes (Signed)
Chief Complaint:   OBESITY Terrye is here to discuss her progress with her obesity treatment plan along with follow-up of her obesity related diagnoses. Aaleigha is on the Category 2 Plan and states she is following her eating plan approximately 70% of the time. Bedie states she is walking for 15-20 minutes 1-2 times per week.  Today's visit was #: 36 Starting weight: 223 lbs Starting date: 02/26/2020 Today's weight: 208 lbs Today's date: 04/08/2020 Total lbs lost to date: 15 Total lbs lost since last in-office visit: 0  Interim History: Rheya reports not following the plan closely recently due to a family death, and then a Labor Day cookout.  Subjective:   1. Type 2 diabetes mellitus with other specified complication, without long-term current use of insulin (HCC) Sofia's fasting BGs range between 90 and 110. She denies hypoglycemia. She denies polyphagia. She is on metformin and is tolerating it well.  2. Essential hypertension Rekha is on Cozaar and Norvasc, and her blood pressure is controlled.  Assessment/Plan:   1. Type 2 diabetes mellitus with other specified complication, without long-term current use of insulin (HCC) Good blood sugar control is important to decrease the likelihood of diabetic complications such as nephropathy, neuropathy, limb loss, blindness, coronary artery disease, and death. Intensive lifestyle modification including diet, exercise and weight loss are the first line of treatment for diabetes. Michela will continue her medications, and we will refill glucose test strips #100 with no refill.  - glucose blood test strip; Use as instructed  Dispense: 100 each; Refill: 0  2. Essential hypertension Leeba will continue her medications, and will continue working on healthy weight loss and exercise to improve blood pressure control. We will watch for signs of hypotension as she continues her lifestyle modifications.  3. Class 2 severe obesity with serious comorbidity and  body mass index (BMI) of 37.0 to 37.9 in adult, unspecified obesity type [facility]) Kennadee is currently in the action stage of change. As such, her goal is to continue with weight loss efforts. She has agreed to the Category 2 Plan.   We will check fasting labs at her next visit.  Exercise goals: As is.  Behavioral modification strategies: increasing lean protein intake and no skipping meals.  Alicea has agreed to follow-up with our clinic in 2 to 3 weeks. She was informed of the importance of frequent follow-up visits to maximize her success with intensive lifestyle modifications for her multiple health conditions.   Objective:   Blood pressure 121/81, pulse 93, temperature 98 F (36.7 C), temperature source Oral, height 5\' 3"  (1.6 m), weight 208 lb (94.3 kg), SpO2 98 %. Body mass index is 36.85 kg/m.  General: Cooperative, alert, well developed, in no acute distress. HEENT: Conjunctivae and lids unremarkable. Cardiovascular: Regular rhythm.  Lungs: Normal work of breathing. Neurologic: No focal deficits.   Lab Results  Component Value Date   CREATININE 1.00 12/14/2019   BUN 14 12/14/2019   NA 143 12/14/2019   K 3.5 12/14/2019   CL 99 12/14/2019   CO2 28 12/14/2019   Lab Results  Component Value Date   ALT 16 12/14/2019   AST 21 12/14/2019   ALKPHOS 50 12/14/2019   BILITOT 0.5 12/14/2019   Lab Results  Component Value Date   HGBA1C 5.9 (H) 12/14/2019   HGBA1C 5.9 (H) 06/05/2019   HGBA1C 6.1 (H) 09/29/2018   HGBA1C 5.9 (H) 05/05/2018   HGBA1C 6.0 (H) 12/21/2017   Lab Results  Component Value Date  INSULIN 16.0 12/14/2019   INSULIN 22.5 06/05/2019   INSULIN 19.0 09/29/2018   INSULIN 17.2 05/05/2018   INSULIN 17.7 12/21/2017   Lab Results  Component Value Date   TSH 1.680 08/31/2017   Lab Results  Component Value Date   CHOL 155 12/14/2019   HDL 61 12/14/2019   LDLCALC 75 12/14/2019   TRIG 103 12/14/2019   CHOLHDL 2.6 08/31/2010   Lab Results  Component  Value Date   WBC 4.4 09/12/2017   HGB 15.0 09/12/2017   HCT 43.7 09/12/2017   MCV 92.6 09/12/2017   PLT 173 09/12/2017   No results found for: IRON, TIBC, FERRITIN  Obesity Behavioral Intervention:   Approximately 15 minutes were spent on the discussion below.  ASK: We discussed the diagnosis of obesity with [name] today and Breezy agreed to give [name] permission to discuss obesity behavioral modification therapy today.  ASSESS: Trecia has the diagnosis of obesity and her BMI today is 36.85. Victor is in the action stage of change.   ADVISE: Braylen was educated on the multiple health risks of obesity as well as the benefit of weight loss to improve her health. She was advised of the need for long term treatment and the importance of lifestyle modifications to improve her current health and to decrease her risk of future health problems.  AGREE: Multiple dietary modification options and treatment options were discussed and Tehilla agreed to follow the recommendations documented in the above note.  ARRANGE: Murrel was educated on the importance of frequent visits to treat obesity as outlined per CMS and USPSTF guidelines and agreed to schedule her next follow up appointment today.  Attestation Statements:   Reviewed by clinician on day of visit: allergies, medications, problem list, medical history, surgical history, family history, social history, and previous encounter notes.   Wilhemena Durie, am acting as transcriptionist for [employer], PA-C.  I have reviewed the above documentation for accuracy and completeness, and I agree with the above. Abby Potash, PA-C

## 2020-04-25 ENCOUNTER — Other Ambulatory Visit: Payer: Self-pay

## 2020-04-25 ENCOUNTER — Ambulatory Visit (INDEPENDENT_AMBULATORY_CARE_PROVIDER_SITE_OTHER): Payer: Medicare HMO | Admitting: Physician Assistant

## 2020-04-25 ENCOUNTER — Encounter (INDEPENDENT_AMBULATORY_CARE_PROVIDER_SITE_OTHER): Payer: Self-pay | Admitting: Physician Assistant

## 2020-04-25 VITALS — BP 132/75 | HR 85 | Temp 98.6°F | Ht 63.0 in | Wt 206.0 lb

## 2020-04-25 DIAGNOSIS — E1159 Type 2 diabetes mellitus with other circulatory complications: Secondary | ICD-10-CM | POA: Diagnosis not present

## 2020-04-25 DIAGNOSIS — E1169 Type 2 diabetes mellitus with other specified complication: Secondary | ICD-10-CM | POA: Diagnosis not present

## 2020-04-25 DIAGNOSIS — E785 Hyperlipidemia, unspecified: Secondary | ICD-10-CM

## 2020-04-25 DIAGNOSIS — I10 Essential (primary) hypertension: Secondary | ICD-10-CM | POA: Diagnosis not present

## 2020-04-25 DIAGNOSIS — Z6836 Body mass index (BMI) 36.0-36.9, adult: Secondary | ICD-10-CM | POA: Diagnosis not present

## 2020-04-25 NOTE — Progress Notes (Signed)
Chief Complaint:   OBESITY Molly [name] is here to discuss her progress with her obesity treatment plan along with follow-up of her obesity related diagnoses. Molly [name] is on the Category 2 Plan and states she is following her eating plan approximately 85% of the time. Molly [name] states she is on the treadmill for 15-20 minutes 3-4 times per week.  Today's visit was #: 46 Starting weight: 223 lbs Starting date: 02/26/2020 Today's weight: 206 lbs Today's date: 04/25/2020 Total lbs lost to date: 17 Total lbs lost since last in-office visit: 2  Interim History: Molly [name] states that she is following the plan better overall. She is also walking on the treadmill more often. She is eating more meat and veggies, and she is staying away from potatoes and rice. She is getting labs done with her primary care provider tomorrow.  Subjective:   1. Type 2 diabetes mellitus with hyperlipidemia (HCC) Molly [name]'s fasting BGs range between 95 and 110. She denies hypoglycemia. She is on metformin. She is due for labs and will have labs done with her primary care provider tomorrow.   2. Hypertension associated with diabetes ([place]) Molly [name] is on Cozaar, Norvasc, and chlorthalidone, and Lasix. Her blood pressure is controlled, and she denies chest pain or headache.  Assessment/Plan:   1. Type 2 diabetes mellitus with hyperlipidemia (HCC) Good blood sugar control is important to decrease the likelihood of diabetic complications such as nephropathy, neuropathy, limb loss, blindness, coronary artery disease, and death. Intensive lifestyle modification including diet, exercise and weight loss are the first line of treatment for diabetes. Molly [name] will continue her medications, and will follow up with her primary care provider.  2. Hypertension associated with diabetes ([place]) Molly [name] will continue her medications, and will continue working on healthy weight loss and exercise to improve blood pressure control. We will watch for signs of hypotension  as she continues her lifestyle modifications.  3. Class 2 severe obesity with serious comorbidity and body mass index (BMI) of 36.0 to 36.9 in adult, unspecified obesity type [facility]) Molly [name] is currently in the action stage of change. As such, her goal is to continue with weight loss efforts. She has agreed to the Category 2 Plan.   Exercise goals: As is.  Behavioral modification strategies: meal planning and cooking strategies and keeping healthy foods in the home.  Molly [name] has agreed to follow-up with our clinic in 3 weeks. She was informed of the importance of frequent follow-up visits to maximize her success with intensive lifestyle modifications for her multiple health conditions.   Objective:   Blood pressure 132/75, pulse 85, temperature 98.6 F (37 C), temperature source Oral, height 5\' 3"  (1.6 m), weight 206 lb (93.4 kg), SpO2 98 %. Body mass index is 36.49 kg/m.  General: Cooperative, alert, well developed, in no acute distress. HEENT: Conjunctivae and lids unremarkable. Cardiovascular: Regular rhythm.  Lungs: Normal work of breathing. Neurologic: No focal deficits.   Lab Results  Component Value Date   CREATININE 1.00 12/14/2019   BUN 14 12/14/2019   NA 143 12/14/2019   K 3.5 12/14/2019   CL 99 12/14/2019   CO2 28 12/14/2019   Lab Results  Component Value Date   ALT 16 12/14/2019   AST 21 12/14/2019   ALKPHOS 50 12/14/2019   BILITOT 0.5 12/14/2019   Lab Results  Component Value Date   HGBA1C 5.9 (H) 12/14/2019   HGBA1C 5.9 (H) 06/05/2019   HGBA1C 6.1 (H) 09/29/2018   HGBA1C 5.9 (H) 05/05/2018  HGBA1C 6.0 (H) 12/21/2017   Lab Results  Component Value Date   INSULIN 16.0 12/14/2019   INSULIN 22.5 06/05/2019   INSULIN 19.0 09/29/2018   INSULIN 17.2 05/05/2018   INSULIN 17.7 12/21/2017   Lab Results  Component Value Date   TSH 1.680 08/31/2017   Lab Results  Component Value Date   CHOL 155 12/14/2019   HDL 61 12/14/2019   LDLCALC 75 12/14/2019   TRIG  103 12/14/2019   CHOLHDL 2.6 08/31/2010   Lab Results  Component Value Date   WBC 4.4 09/12/2017   HGB 15.0 09/12/2017   HCT 43.7 09/12/2017   MCV 92.6 09/12/2017   PLT 173 09/12/2017   No results found for: IRON, TIBC, FERRITIN  Obesity Behavioral Intervention:   Approximately 15 minutes were spent on the discussion below.  ASK: We discussed the diagnosis of obesity with Molly [name] today and Molly [name] agreed to give [name] permission to discuss obesity behavioral modification therapy today.  ASSESS: Molly [name] has the diagnosis of obesity and her BMI today is 36.5. Molly [name] is in the action stage of change.   ADVISE: Molly [name] was educated on the multiple health risks of obesity as well as the benefit of weight loss to improve her health. She was advised of the need for long term treatment and the importance of lifestyle modifications to improve her current health and to decrease her risk of future health problems.  AGREE: Multiple dietary modification options and treatment options were discussed and Molly [name] agreed to follow the recommendations documented in the above note.  ARRANGE: Molly [name] was educated on the importance of frequent visits to treat obesity as outlined per CMS and USPSTF guidelines and agreed to schedule her next follow up appointment today.  Attestation Statements:   Reviewed by clinician on day of visit: allergies, medications, problem list, medical history, surgical history, family history, social history, and previous encounter notes.   Wilhemena Durie, am acting as transcriptionist for [employer], PA-C.  I have reviewed the above documentation for accuracy and completeness, and I agree with the above. -  *Abby Potash, PA-C

## 2020-04-26 DIAGNOSIS — E118 Type 2 diabetes mellitus with unspecified complications: Secondary | ICD-10-CM | POA: Diagnosis not present

## 2020-04-26 DIAGNOSIS — E559 Vitamin D deficiency, unspecified: Secondary | ICD-10-CM | POA: Diagnosis not present

## 2020-04-26 DIAGNOSIS — E785 Hyperlipidemia, unspecified: Secondary | ICD-10-CM | POA: Diagnosis not present

## 2020-04-26 DIAGNOSIS — Z87891 Personal history of nicotine dependence: Secondary | ICD-10-CM | POA: Diagnosis not present

## 2020-04-26 DIAGNOSIS — Z0000 Encounter for general adult medical examination without abnormal findings: Secondary | ICD-10-CM | POA: Diagnosis not present

## 2020-04-26 DIAGNOSIS — I10 Essential (primary) hypertension: Secondary | ICD-10-CM | POA: Diagnosis not present

## 2020-04-26 DIAGNOSIS — Z794 Long term (current) use of insulin: Secondary | ICD-10-CM | POA: Diagnosis not present

## 2020-05-02 DIAGNOSIS — I10 Essential (primary) hypertension: Secondary | ICD-10-CM | POA: Diagnosis not present

## 2020-05-02 DIAGNOSIS — E118 Type 2 diabetes mellitus with unspecified complications: Secondary | ICD-10-CM | POA: Diagnosis not present

## 2020-05-16 ENCOUNTER — Other Ambulatory Visit: Payer: Self-pay

## 2020-05-16 ENCOUNTER — Encounter (INDEPENDENT_AMBULATORY_CARE_PROVIDER_SITE_OTHER): Payer: Self-pay | Admitting: Physician Assistant

## 2020-05-16 ENCOUNTER — Ambulatory Visit (INDEPENDENT_AMBULATORY_CARE_PROVIDER_SITE_OTHER): Payer: Medicare HMO | Admitting: Physician Assistant

## 2020-05-16 VITALS — BP 137/79 | HR 87 | Temp 98.3°F | Ht 63.0 in | Wt 208.0 lb

## 2020-05-16 DIAGNOSIS — Z6836 Body mass index (BMI) 36.0-36.9, adult: Secondary | ICD-10-CM

## 2020-05-16 DIAGNOSIS — E559 Vitamin D deficiency, unspecified: Secondary | ICD-10-CM | POA: Diagnosis not present

## 2020-05-16 DIAGNOSIS — E785 Hyperlipidemia, unspecified: Secondary | ICD-10-CM

## 2020-05-16 DIAGNOSIS — E1169 Type 2 diabetes mellitus with other specified complication: Secondary | ICD-10-CM

## 2020-05-21 NOTE — Progress Notes (Signed)
Chief Complaint:   OBESITY Molly [name] is here to discuss her progress with her obesity treatment plan along with follow-up of her obesity related diagnoses. Mena is on the Category 2 Plan and states she is following her eating plan approximately 85% of the time. Bettyann states she is stretching 15-20 minutes 7 times per week and walking 15-20 minutes 3 times per week.  Today's visit was #: 67 Starting weight: 223 lbs Starting date: 08/31/2017 Today's weight: 208 lbs Today's date: 05/16/2020 Total lbs lost to date: 15 Total lbs lost since last in-office visit: 0  Interim History: Tenika reports that she has been eating more bacon than normal. She has also been attending fall festivals and [place] gatherings.  Subjective:   Type 2 diabetes mellitus with hyperlipidemia ([place]). Last A1c 6.0 with PCP this month. Waynette is on metformin. No polyphagia or hypoglycemia. She does check her blood sugars regularly at home.   Lab Results  Component Value Date   HGBA1C 5.9 (H) 12/14/2019   HGBA1C 5.9 (H) 06/05/2019   HGBA1C 6.1 (H) 09/29/2018   Lab Results  Component Value Date   LDLCALC 75 12/14/2019   CREATININE 1.00 12/14/2019   Lab Results  Component Value Date   INSULIN 16.0 12/14/2019   INSULIN 22.5 06/05/2019   INSULIN 19.0 09/29/2018   INSULIN 17.2 05/05/2018   INSULIN 17.7 12/21/2017   Vitamin D deficiency. Last Vitamin D level was reported to be 63. Adalida is taking Vitamin D every other day.   Ref. Range 12/14/2019 13:23  Vitamin D, 25-Hydroxy Latest Ref Range: 30.0 - 100.0 ng/mL 50.7   Assessment/Plan:   Type 2 diabetes mellitus with hyperlipidemia ([place]). Good blood sugar control is important to decrease the likelihood of diabetic complications such as nephropathy, neuropathy, limb loss, blindness, coronary artery disease, and death. Intensive lifestyle modification including diet, exercise and weight loss are the first line of treatment for diabetes. Samiha will continue her  medication as directed and continue to monitor blood sugars and A1c.  Vitamin D deficiency. Low Vitamin D level contributes to fatigue and are associated with obesity, breast, and colon cancer. She agrees to continue to take Vitamin D as directed and will follow-up for routine testing of Vitamin D, at least 2-3 times per year to avoid over-replacement.  Class 2 severe obesity with serious comorbidity and body mass index (BMI) of 36.0 to 36.9 in adult, unspecified obesity type ([place]).  Isebella is currently in the action stage of change. As such, her goal is to continue with weight loss efforts. She has agreed to the Category 2 Plan.   Exercise goals: Older adults should follow the adult guidelines. When older adults cannot meet the adult guidelines, they should be as physically active as their abilities and conditions will allow.   Behavioral modification strategies: increasing lean protein intake and celebration eating strategies.  Inaya has agreed to follow-up with our clinic in 3-4 weeks. She was informed of the importance of frequent follow-up visits to maximize her success with intensive lifestyle modifications for her multiple health conditions.   Objective:   Blood pressure 137/79, pulse 87, temperature 98.3 F (36.8 C), temperature source Oral, height 5\' 3"  (1.6 m), weight 208 lb (94.3 kg), SpO2 99 %. Body mass index is 36.85 kg/m.  General: Cooperative, alert, well developed, in no acute distress. HEENT: Conjunctivae and lids unremarkable. Cardiovascular: Regular rhythm.  Lungs: Normal work of breathing. Neurologic: No focal deficits.   Lab Results  Component Value  Date   CREATININE 1.00 12/14/2019   BUN 14 12/14/2019   NA 143 12/14/2019   K 3.5 12/14/2019   CL 99 12/14/2019   CO2 28 12/14/2019   Lab Results  Component Value Date   ALT 16 12/14/2019   AST 21 12/14/2019   ALKPHOS 50 12/14/2019   BILITOT 0.5 12/14/2019   Lab Results  Component Value Date   HGBA1C 5.9 (H)  12/14/2019   HGBA1C 5.9 (H) 06/05/2019   HGBA1C 6.1 (H) 09/29/2018   HGBA1C 5.9 (H) 05/05/2018   HGBA1C 6.0 (H) 12/21/2017   Lab Results  Component Value Date   INSULIN 16.0 12/14/2019   INSULIN 22.5 06/05/2019   INSULIN 19.0 09/29/2018   INSULIN 17.2 05/05/2018   INSULIN 17.7 12/21/2017   Lab Results  Component Value Date   TSH 1.680 08/31/2017   Lab Results  Component Value Date   CHOL 155 12/14/2019   HDL 61 12/14/2019   LDLCALC 75 12/14/2019   TRIG 103 12/14/2019   CHOLHDL 2.6 08/31/2010   Lab Results  Component Value Date   WBC 4.4 09/12/2017   HGB 15.0 09/12/2017   HCT 43.7 09/12/2017   MCV 92.6 09/12/2017   PLT 173 09/12/2017   No results found for: IRON, TIBC, FERRITIN  Obesity Behavioral Intervention:   Approximately 15 minutes were spent on the discussion below.  ASK: We discussed the diagnosis of obesity with [name] today and Tamora agreed to give [name] permission to discuss obesity behavioral modification therapy today.  ASSESS: Claudia has the diagnosis of obesity and her BMI today is 36.9. Shanley is in the action stage of change.   ADVISE: Jeanet was educated on the multiple health risks of obesity as well as the benefit of weight loss to improve her health. She was advised of the need for long term treatment and the importance of lifestyle modifications to improve her current health and to decrease her risk of future health problems.  AGREE: Multiple dietary modification options and treatment options were discussed and Samai agreed to follow the recommendations documented in the above note.  ARRANGE: Eloyce was educated on the importance of frequent visits to treat obesity as outlined per CMS and USPSTF guidelines and agreed to schedule her next follow up appointment today.  Attestation Statements:   Reviewed by clinician on day of visit: allergies, medications, problem list, medical history, surgical history, family history, social history, and previous  encounter notes.  IMichaelene Song, am acting as transcriptionist for Abby Potash, PA-C   I have reviewed the above documentation for accuracy and completeness, and I agree with the above. Abby Potash, PA-C

## 2020-06-13 ENCOUNTER — Ambulatory Visit (INDEPENDENT_AMBULATORY_CARE_PROVIDER_SITE_OTHER): Payer: Medicare HMO | Admitting: Physician Assistant

## 2020-06-13 ENCOUNTER — Other Ambulatory Visit: Payer: Self-pay

## 2020-06-13 ENCOUNTER — Encounter (INDEPENDENT_AMBULATORY_CARE_PROVIDER_SITE_OTHER): Payer: Self-pay | Admitting: Physician Assistant

## 2020-06-13 VITALS — BP 149/78 | HR 99 | Temp 98.1°F | Ht 63.0 in | Wt 206.0 lb

## 2020-06-13 DIAGNOSIS — Z6836 Body mass index (BMI) 36.0-36.9, adult: Secondary | ICD-10-CM | POA: Diagnosis not present

## 2020-06-13 DIAGNOSIS — I10 Essential (primary) hypertension: Secondary | ICD-10-CM | POA: Diagnosis not present

## 2020-06-13 DIAGNOSIS — E119 Type 2 diabetes mellitus without complications: Secondary | ICD-10-CM | POA: Diagnosis not present

## 2020-06-13 DIAGNOSIS — I152 Hypertension secondary to endocrine disorders: Secondary | ICD-10-CM

## 2020-06-18 NOTE — Progress Notes (Signed)
Chief Complaint:   OBESITY Molly [name] is here to discuss her progress with her obesity treatment plan along with follow-up of her obesity related diagnoses. Molly [name] is on the Category 2 Plan and states she is following her eating plan approximately 85% of the time. Molly [name] states she is walking 15-20 minutes 2-3 times per week.  Today's visit was #: 72 Starting weight: 223 lbs Starting date: 08/31/2017 Today's weight: 206 lbs Today's date: 06/13/2020 Total lbs lost to date: 17 Total lbs lost since last in-office visit: 2  Interim History: Molly [name] states that she has been doing well the past few weeks. She reports that her trouble is consistency with the plan and getting in her protein.  Subjective:   Essential hypertension. Molly [name] is checking her blood pressure at home and reports it is averaging 120's/70's after taking her medications. Blood pressure is elevated today, but she has not taken her medications today.  BP Readings from Last 3 Encounters:  06/13/20 (!) 149/78  05/16/20 137/79  04/25/20 132/75   Lab Results  Component Value Date   CREATININE 1.00 12/14/2019   CREATININE 0.99 06/05/2019   CREATININE 0.95 09/29/2018   Diabetes mellitus with coincident hypertension ([place]). Molly [name] is checking her blood sugars at home and reports fasting blood sugars averaging 98-110 with postprandials 130-140's. She denies hypoglycemia on metformin.   Lab Results  Component Value Date   HGBA1C 5.9 (H) 12/14/2019   HGBA1C 5.9 (H) 06/05/2019   HGBA1C 6.1 (H) 09/29/2018   Lab Results  Component Value Date   LDLCALC 75 12/14/2019   CREATININE 1.00 12/14/2019   Lab Results  Component Value Date   INSULIN 16.0 12/14/2019   INSULIN 22.5 06/05/2019   INSULIN 19.0 09/29/2018   INSULIN 17.2 05/05/2018   INSULIN 17.7 12/21/2017   Assessment/Plan:   Essential hypertension. Molly [name] is working on healthy weight loss and exercise to improve blood pressure control. We will watch for signs of  hypotension as she continues her lifestyle modifications. She will continue her medications as directed.   Diabetes mellitus with coincident hypertension ([place]). Good blood sugar control is important to decrease the likelihood of diabetic complications such as nephropathy, neuropathy, limb loss, blindness, coronary artery disease, and death. Intensive lifestyle modification including diet, exercise and weight loss are the first line of treatment for diabetes. Molly [name] will continue her medication as directed.   Class 2 severe obesity with serious comorbidity and body mass index (BMI) of 36.0 to 36.9 in adult, unspecified obesity type ([place]).  Molly [name] is currently in the action stage of change. As such, her goal is to continue with weight loss efforts. She has agreed to the Category 2 Plan.   Labs will be checked at her next visit.  Exercise goals: Older adults should follow the adult guidelines. When older adults cannot meet the adult guidelines, they should be as physically active as their abilities and conditions will allow.   Behavioral modification strategies: meal planning and cooking strategies and holiday eating strategies .  Molly [name] has agreed to follow-up with our clinic in 4 weeks. She was informed of the importance of frequent follow-up visits to maximize her success with intensive lifestyle modifications for her multiple health conditions.   Objective:   Blood pressure (!) 149/78, pulse 99, temperature 98.1 F (36.7 C), temperature source Oral, height 5\' 3"  (1.6 m), weight 206 lb (93.4 kg), SpO2 99 %. Body mass index is 36.49 kg/m.  General: Cooperative, alert, well developed, in no acute distress.  HEENT: Conjunctivae and lids unremarkable. Cardiovascular: Regular rhythm.  Lungs: Normal work of breathing. Neurologic: No focal deficits.   Lab Results  Component Value Date   CREATININE 1.00 12/14/2019   BUN 14 12/14/2019   NA 143 12/14/2019   K 3.5 12/14/2019   CL 99 12/14/2019    CO2 28 12/14/2019   Lab Results  Component Value Date   ALT 16 12/14/2019   AST 21 12/14/2019   ALKPHOS 50 12/14/2019   BILITOT 0.5 12/14/2019   Lab Results  Component Value Date   HGBA1C 5.9 (H) 12/14/2019   HGBA1C 5.9 (H) 06/05/2019   HGBA1C 6.1 (H) 09/29/2018   HGBA1C 5.9 (H) 05/05/2018   HGBA1C 6.0 (H) 12/21/2017   Lab Results  Component Value Date   INSULIN 16.0 12/14/2019   INSULIN 22.5 06/05/2019   INSULIN 19.0 09/29/2018   INSULIN 17.2 05/05/2018   INSULIN 17.7 12/21/2017   Lab Results  Component Value Date   TSH 1.680 08/31/2017   Lab Results  Component Value Date   CHOL 155 12/14/2019   HDL 61 12/14/2019   LDLCALC 75 12/14/2019   TRIG 103 12/14/2019   CHOLHDL 2.6 08/31/2010   Lab Results  Component Value Date   WBC 4.4 09/12/2017   HGB 15.0 09/12/2017   HCT 43.7 09/12/2017   MCV 92.6 09/12/2017   PLT 173 09/12/2017   No results found for: IRON, TIBC, FERRITIN  Obesity Behavioral Intervention:   Approximately 15 minutes were spent on the discussion below.  ASK: We discussed the diagnosis of obesity with Molly [name] today and Molly [name] agreed to give [name] permission to discuss obesity behavioral modification therapy today.  ASSESS: Molly [name] has the diagnosis of obesity and her BMI today is 36.5. Molly [name] is in the action stage of change.   ADVISE: Molly [name] was educated on the multiple health risks of obesity as well as the benefit of weight loss to improve her health. She was advised of the need for long term treatment and the importance of lifestyle modifications to improve her current health and to decrease her risk of future health problems.  AGREE: Multiple dietary modification options and treatment options were discussed and Molly [name] agreed to follow the recommendations documented in the above note.  ARRANGE: Molly [name] was educated on the importance of frequent visits to treat obesity as outlined per CMS and USPSTF guidelines and agreed to schedule her next follow up  appointment today.  Attestation Statements:   Reviewed by clinician on day of visit: allergies, medications, problem list, medical history, surgical history, family history, social history, and previous encounter notes.  IMichaelene Song, am acting as transcriptionist for Abby Potash, PA-C   I have reviewed the above documentation for accuracy and completeness, and I agree with the above. Abby Potash, PA-C

## 2020-07-08 DIAGNOSIS — R42 Dizziness and giddiness: Secondary | ICD-10-CM | POA: Diagnosis not present

## 2020-07-10 ENCOUNTER — Encounter (INDEPENDENT_AMBULATORY_CARE_PROVIDER_SITE_OTHER): Payer: Self-pay | Admitting: Physician Assistant

## 2020-07-10 ENCOUNTER — Ambulatory Visit (INDEPENDENT_AMBULATORY_CARE_PROVIDER_SITE_OTHER): Payer: Medicare HMO | Admitting: Physician Assistant

## 2020-07-10 ENCOUNTER — Other Ambulatory Visit: Payer: Self-pay

## 2020-07-10 VITALS — BP 154/70 | HR 91 | Temp 98.0°F | Ht 63.0 in | Wt 205.0 lb

## 2020-07-10 DIAGNOSIS — E782 Mixed hyperlipidemia: Secondary | ICD-10-CM | POA: Diagnosis not present

## 2020-07-10 DIAGNOSIS — Z6836 Body mass index (BMI) 36.0-36.9, adult: Secondary | ICD-10-CM

## 2020-07-10 DIAGNOSIS — E785 Hyperlipidemia, unspecified: Secondary | ICD-10-CM

## 2020-07-10 DIAGNOSIS — E1159 Type 2 diabetes mellitus with other circulatory complications: Secondary | ICD-10-CM

## 2020-07-10 DIAGNOSIS — I152 Hypertension secondary to endocrine disorders: Secondary | ICD-10-CM

## 2020-07-10 DIAGNOSIS — E7849 Other hyperlipidemia: Secondary | ICD-10-CM | POA: Diagnosis not present

## 2020-07-10 DIAGNOSIS — I10 Essential (primary) hypertension: Secondary | ICD-10-CM | POA: Diagnosis not present

## 2020-07-10 DIAGNOSIS — E559 Vitamin D deficiency, unspecified: Secondary | ICD-10-CM | POA: Diagnosis not present

## 2020-07-10 DIAGNOSIS — E1169 Type 2 diabetes mellitus with other specified complication: Secondary | ICD-10-CM

## 2020-07-10 NOTE — Progress Notes (Signed)
Chief Complaint:   OBESITY Daziyah is here to discuss her progress with her obesity treatment plan along with follow-up of her obesity related diagnoses. Emnet is on the Category 2 Plan and states she is following her eating plan approximately 80% of the time. Quin states she is walking for 15-20 minutes 3 times per week.  Today's visit was #: 34 Starting weight: 223 lbs Starting date: 08/31/2017 Today's weight: 205 lbs Today's date: 07/10/2020 Total lbs lost to date: 18 lbs Total lbs lost since last in-office visit: 1 lb  Interim History: Georgena reports that she is eating a lot of the same things daily.  She is following the plan well most days.  She is not hungry throughout the day and has to make herself eat at times.  Subjective:   1. Hyperlipidemia associated with type 2 diabetes mellitus ([place]) Milana has hyperlipidemia and has been trying to improve her cholesterol levels with intensive lifestyle modification including a low saturated fat diet, exercise and weight loss. She is on Crestor.  No chest pain or myalgias.  Last LDL slightly above goal.  Lab Results  Component Value Date   ALT 16 12/14/2019   AST 21 12/14/2019   ALKPHOS 50 12/14/2019   BILITOT 0.5 12/14/2019   Lab Results  Component Value Date   CHOL 155 12/14/2019   HDL 61 12/14/2019   LDLCALC 75 12/14/2019   TRIG 103 12/14/2019   CHOLHDL 2.6 08/31/2010   2. Hypertension associated with diabetes [facility]) Reports that she went to the ER for vertigo.  Blood pressure in the hospital reported to be normal.  She has not taken her blood pressure medications this morning.  No chest pain or headache.  BP Readings from Last 3 Encounters:  07/10/20 (!) 154/70  06/13/20 (!) 149/78  05/16/20 137/79   3. Type 2 diabetes mellitus with hyperlipidemia ([facility]) On metformin.  No polyphagia.  No hypoglycemia.  Fast blood sugar 90-107.  Lab Results  Component Value Date   HGBA1C 5.9 (H) 12/14/2019   HGBA1C 5.9 (H) 06/05/2019    HGBA1C 6.1 (H) 09/29/2018   Lab Results  Component Value Date   LDLCALC 75 12/14/2019   CREATININE 1.00 12/14/2019   Lab Results  Component Value Date   INSULIN 16.0 12/14/2019   INSULIN 22.5 06/05/2019   INSULIN 19.0 09/29/2018   INSULIN 17.2 05/05/2018   INSULIN 17.7 12/21/2017   4. Vitamin D deficiency Lilyonna's Vitamin D level was 50.7 on 12/14/2019. She is currently taking OTC vitamin D 5,000 IU every other day. She denies nausea, vomiting or muscle weakness.  Denies excessive fatigue.  Assessment/Plan:   1. Hyperlipidemia associated with type 2 diabetes mellitus ([place]) Cardiovascular risk and specific lipid/LDL goals reviewed.  We discussed several lifestyle modifications today and Nikolina will continue to work on diet, exercise and weight loss efforts. Orders and follow up as documented in patient record.  Will check labs today.  Counseling Intensive lifestyle modifications are the first line treatment for this issue.  Dietary changes: Increase soluble fiber. Decrease simple carbohydrates.  Exercise changes: Moderate to vigorous-intensity aerobic activity 150 minutes per week if tolerated.  Lipid-lowering medications: see documented in medical record.  2. Hypertension associated with diabetes ([place]) Sukari is working on healthy weight loss and exercise to improve blood pressure control. We will watch for signs of hypotension as she continues her lifestyle modifications.  Continue medications and will monitor blood pressure at each visit.  - Comprehensive metabolic panel  3. Type 2 diabetes mellitus with hyperlipidemia ([facility]) Good blood sugar control is important to decrease the likelihood of diabetic complications such as nephropathy, neuropathy, limb loss, blindness, coronary artery disease, and death. Intensive lifestyle modification including diet, exercise and weight loss are the first line of treatment for diabetes.  Check A1c today.  - Hemoglobin A1c - Insulin, random -  Lipid panel  4. Vitamin D deficiency Low Vitamin D level contributes to fatigue and are associated with obesity, breast, and colon cancer. She agrees to continue to take OTC Vitamin D @5 ,000 IU every other day and will follow-up for routine testing of Vitamin D, at least 2-3 times per year to avoid over-replacement.    - VITAMIN D 25 Hydroxy (Vit-D Deficiency, Fractures)  5. Class 2 severe obesity with serious comorbidity and body mass index (BMI) of 36.0 to 36.9 in adult, unspecified obesity type [facility])  Dyneshia is currently in the action stage of change. As such, her goal is to continue with weight loss efforts. She has agreed to the Category 2 Plan.   Exercise goals: Older adults should follow the adult guidelines. When older adults cannot meet the adult guidelines, they should be as physically active as their abilities and conditions will allow.  Older adults should do exercises that maintain or improve balance if they are at risk of falling.  As is.  Behavioral modification strategies: meal planning and cooking strategies and keeping healthy foods in the home.  Elandra has agreed to follow-up with our clinic in 3-4 weeks. She was informed of the importance of frequent follow-up visits to maximize her success with intensive lifestyle modifications for her multiple health conditions.   Cylinda was informed we would discuss her lab results at her next visit unless there is a critical issue that needs to be addressed sooner. Narmeen agreed to keep her next visit at the agreed upon time to discuss these results.  Objective:   Blood pressure (!) 154/70, pulse 91, temperature 98 F (36.7 C), height 5\' 3"  (1.6 m), weight 205 lb (93 kg), SpO2 99 %. Body mass index is 36.31 kg/m.  General: Cooperative, alert, well developed, in no acute distress. HEENT: Conjunctivae and lids unremarkable. Cardiovascular: Regular rhythm.  Lungs: Normal work of breathing. Neurologic: No focal deficits.   Lab Results   Component Value Date   CREATININE 1.00 12/14/2019   BUN 14 12/14/2019   NA 143 12/14/2019   K 3.5 12/14/2019   CL 99 12/14/2019   CO2 28 12/14/2019   Lab Results  Component Value Date   ALT 16 12/14/2019   AST 21 12/14/2019   ALKPHOS 50 12/14/2019   BILITOT 0.5 12/14/2019   Lab Results  Component Value Date   HGBA1C 5.9 (H) 12/14/2019   HGBA1C 5.9 (H) 06/05/2019   HGBA1C 6.1 (H) 09/29/2018   HGBA1C 5.9 (H) 05/05/2018   HGBA1C 6.0 (H) 12/21/2017   Lab Results  Component Value Date   INSULIN 16.0 12/14/2019   INSULIN 22.5 06/05/2019   INSULIN 19.0 09/29/2018   INSULIN 17.2 05/05/2018   INSULIN 17.7 12/21/2017   Lab Results  Component Value Date   TSH 1.680 08/31/2017   Lab Results  Component Value Date   CHOL 155 12/14/2019   HDL 61 12/14/2019   LDLCALC 75 12/14/2019   TRIG 103 12/14/2019   CHOLHDL 2.6 08/31/2010   Lab Results  Component Value Date   WBC 4.4 09/12/2017   HGB 15.0 09/12/2017   HCT 43.7 09/12/2017  MCV 92.6 09/12/2017   PLT 173 09/12/2017   Obesity Behavioral Intervention:   Approximately 15 minutes were spent on the discussion below.  ASK: We discussed the diagnosis of obesity with [name] today and [place] agreed to give [name] permission to discuss obesity behavioral modification therapy today.  ASSESS: Genoa has the diagnosis of obesity and her BMI today is 36.3. Blannie is in the action stage of change.   ADVISE: Teniyah was educated on the multiple health risks of obesity as well as the benefit of weight loss to improve her health. She was advised of the need for long term treatment and the importance of lifestyle modifications to improve her current health and to decrease her risk of future health problems.  AGREE: Multiple dietary modification options and treatment options were discussed and Dyneshia agreed to follow the recommendations documented in the above note.  ARRANGE: Eudelia was educated on the importance of frequent visits to treat obesity  as outlined per CMS and USPSTF guidelines and agreed to schedule her next follow up appointment today.  Attestation Statements:   Reviewed by clinician on day of visit: allergies, medications, problem list, medical history, surgical history, family history, social history, and previous encounter notes.  I, Water quality scientist, CMA, am acting as transcriptionist for Abby Potash, PA-C  I have reviewed the above documentation for accuracy and completeness, and I agree with the above. Abby Potash, PA-C

## 2020-07-11 LAB — COMPREHENSIVE METABOLIC PANEL
ALT: 18 IU/L (ref 0–32)
AST: 19 IU/L (ref 0–40)
Albumin/Globulin Ratio: 1.7 (ref 1.2–2.2)
Albumin: 4.8 g/dL — ABNORMAL HIGH (ref 3.7–4.7)
Alkaline Phosphatase: 54 IU/L (ref 44–121)
BUN/Creatinine Ratio: 15 (ref 12–28)
BUN: 15 mg/dL (ref 8–27)
Bilirubin Total: 0.4 mg/dL (ref 0.0–1.2)
CO2: 28 mmol/L (ref 20–29)
Calcium: 10.3 mg/dL (ref 8.7–10.3)
Chloride: 101 mmol/L (ref 96–106)
Creatinine, Ser: 1.01 mg/dL — ABNORMAL HIGH (ref 0.57–1.00)
GFR calc Af Amer: 63 mL/min/{1.73_m2} (ref >59)
GFR calc non Af Amer: 55 mL/min/{1.73_m2} — ABNORMAL LOW (ref >59)
Globulin, Total: 2.8 g/dL (ref 1.5–4.5)
Glucose: 112 mg/dL — ABNORMAL HIGH (ref 65–99)
Potassium: 3.6 mmol/L (ref 3.5–5.2)
Sodium: 143 mmol/L (ref 134–144)
Total Protein: 7.6 g/dL (ref 6.0–8.5)

## 2020-07-11 LAB — LIPID PANEL
Chol/HDL Ratio: 2.8 ratio (ref 0.0–4.4)
Cholesterol, Total: 163 mg/dL (ref 100–199)
HDL: 59 mg/dL (ref >39)
LDL Chol Calc (NIH): 85 mg/dL (ref 0–99)
Triglycerides: 107 mg/dL (ref 0–149)
VLDL Cholesterol Cal: 19 mg/dL (ref 5–40)

## 2020-07-11 LAB — INSULIN, RANDOM: INSULIN: 12 u[IU]/mL (ref 2.6–24.9)

## 2020-07-11 LAB — HEMOGLOBIN A1C
Est. average glucose Bld gHb Est-mCnc: 131 mg/dL
Hgb A1c MFr Bld: 6.2 % — ABNORMAL HIGH (ref 4.8–5.6)

## 2020-07-11 LAB — VITAMIN D 25 HYDROXY (VIT D DEFICIENCY, FRACTURES): Vit D, 25-Hydroxy: 47.9 ng/mL (ref 30.0–100.0)

## 2020-08-07 ENCOUNTER — Telehealth (INDEPENDENT_AMBULATORY_CARE_PROVIDER_SITE_OTHER): Payer: Medicare HMO | Admitting: Physician Assistant

## 2020-08-07 ENCOUNTER — Encounter (INDEPENDENT_AMBULATORY_CARE_PROVIDER_SITE_OTHER): Payer: Self-pay | Admitting: Physician Assistant

## 2020-08-07 DIAGNOSIS — I152 Hypertension secondary to endocrine disorders: Secondary | ICD-10-CM

## 2020-08-07 DIAGNOSIS — Z6836 Body mass index (BMI) 36.0-36.9, adult: Secondary | ICD-10-CM

## 2020-08-07 DIAGNOSIS — E1159 Type 2 diabetes mellitus with other circulatory complications: Secondary | ICD-10-CM | POA: Diagnosis not present

## 2020-08-07 DIAGNOSIS — E559 Vitamin D deficiency, unspecified: Secondary | ICD-10-CM | POA: Diagnosis not present

## 2020-08-08 NOTE — Progress Notes (Unsigned)
TeleHealth Visit:  Due to the COVID-19 pandemic, this visit was completed with telemedicine (audio/video) technology to reduce patient and provider exposure as well as to preserve personal protective equipment.   Molly [name] has verbally consented to this TeleHealth visit. The patient is located at home, the provider is located at the [facility] office. The participants in this visit include the listed provider and patient. The visit was conducted today via MyChart video.   Chief Complaint: OBESITY Molly [name] is here to discuss her progress with her obesity treatment plan along with follow-up of her obesity related diagnoses. Molly [name] is on the Category 2 Plan and states she is following her eating plan approximately 75% of the time. Molly [name] states she is walking and stretching.   Today's visit was #: 51 Starting weight: 223 lbs Starting date: 08/31/2017  Interim History: Molly [name] reports her weight at home today was 205 lbs. She states that her eating was "up and down" over the holidays. She is trying to get back into routine and back on the plan. She is not drinking enough water.  Subjective:   1. Type 2 diabetes mellitus associated with hypertension ([place]) Indria's last A1c was 6.2. She is on metformin, and she is tolerating it well. She denies nausea, vomiting, or diarrhea. She denies polyphagia or hypoglycemia. I discussed labs with the patient today.  2. Vitamin D deficiency Molly [name] is on Vit D every other day. She denies nausea, vomiting, or muscle weakness. Her energy is good per the patient. I discussed labs with the patient today.  Assessment/Plan:   1. Type 2 diabetes mellitus associated with hypertension (HCC) Good blood sugar control is important to decrease the likelihood of diabetic complications such as nephropathy, neuropathy, limb loss, blindness, coronary artery disease, and death. Intensive lifestyle modification including diet, exercise and weight loss are the first line of  treatment for diabetes. Lorraina will continue her medications and will continue to monitor her blood sugars daily.  2. Vitamin D deficiency Low Vitamin D level contributes to fatigue and are associated with obesity, breast, and colon cancer. Molly [name] agreed to increase Vitamin D to once daily and will follow-up for routine testing of Vitamin D, at least 2-3 times per year to avoid over-replacement.  3. Class 2 severe obesity with serious comorbidity and body mass index (BMI) of 36.0 to 36.9 in adult, unspecified obesity type [facility]) Molly [name] is currently in the action stage of change. As such, her goal is to continue with weight loss efforts. She has agreed to the Category 2 Plan.   Exercise goals: As is.  Behavioral modification strategies: increasing lean protein intake, decreasing simple carbohydrates and increasing water intake.  Molly [name] has agreed to follow-up with our clinic in 3 weeks. She was informed of the importance of frequent follow-up visits to maximize her success with intensive lifestyle modifications for her multiple health conditions.  Objective:   VITALS: Per patient if applicable, see vitals. GENERAL: Alert and in no acute distress. CARDIOPULMONARY: No increased WOB. Speaking in clear sentences.  PSYCH: Pleasant and cooperative. Speech normal rate and rhythm. Affect is appropriate. Insight and judgement are appropriate. Attention is focused, linear, and appropriate.  NEURO: Oriented as arrived to appointment on time with no prompting.   Lab Results  Component Value Date   CREATININE 1.01 (H) 07/10/2020   BUN 15 07/10/2020   NA 143 07/10/2020   K 3.6 07/10/2020   CL 101 07/10/2020   CO2 28 07/10/2020   Lab Results  Component Value Date   ALT 18 07/10/2020   AST 19 07/10/2020   ALKPHOS 54 07/10/2020   BILITOT 0.4 07/10/2020   Lab Results  Component Value Date   HGBA1C 6.2 (H) 07/10/2020   HGBA1C 5.9 (H) 12/14/2019   HGBA1C 5.9 (H) 06/05/2019   HGBA1C 6.1 (H) 09/29/2018    HGBA1C 5.9 (H) 05/05/2018   Lab Results  Component Value Date   INSULIN 12.0 07/10/2020   INSULIN 16.0 12/14/2019   INSULIN 22.5 06/05/2019   INSULIN 19.0 09/29/2018   INSULIN 17.2 05/05/2018   Lab Results  Component Value Date   TSH 1.680 08/31/2017   Lab Results  Component Value Date   CHOL 163 07/10/2020   HDL 59 07/10/2020   LDLCALC 85 07/10/2020   TRIG 107 07/10/2020   CHOLHDL 2.8 07/10/2020   Lab Results  Component Value Date   WBC 4.4 09/12/2017   HGB 15.0 09/12/2017   HCT 43.7 09/12/2017   MCV 92.6 09/12/2017   PLT 173 09/12/2017   No results found for: IRON, TIBC, FERRITIN  Attestation Statements:   Reviewed by clinician on day of visit: allergies, medications, problem list, medical history, surgical history, family history, social history, and previous encounter notes.   Wilhemena Durie, am acting as transcriptionist for [employer], PA-C.  I have reviewed the above documentation for accuracy and completeness, and I agree with the above. Abby Potash, PA-C

## 2020-08-28 ENCOUNTER — Other Ambulatory Visit: Payer: Self-pay

## 2020-08-28 ENCOUNTER — Encounter (INDEPENDENT_AMBULATORY_CARE_PROVIDER_SITE_OTHER): Payer: Self-pay | Admitting: Physician Assistant

## 2020-08-28 ENCOUNTER — Telehealth (INDEPENDENT_AMBULATORY_CARE_PROVIDER_SITE_OTHER): Payer: Medicare HMO | Admitting: Physician Assistant

## 2020-08-28 DIAGNOSIS — I152 Hypertension secondary to endocrine disorders: Secondary | ICD-10-CM | POA: Diagnosis not present

## 2020-08-28 DIAGNOSIS — Z6836 Body mass index (BMI) 36.0-36.9, adult: Secondary | ICD-10-CM

## 2020-08-28 DIAGNOSIS — E1169 Type 2 diabetes mellitus with other specified complication: Secondary | ICD-10-CM | POA: Diagnosis not present

## 2020-08-28 DIAGNOSIS — E1159 Type 2 diabetes mellitus with other circulatory complications: Secondary | ICD-10-CM

## 2020-08-29 NOTE — Progress Notes (Signed)
TeleHealth Visit:  Due to the COVID-19 pandemic, this visit was completed with telemedicine (audio/video) technology to reduce patient and provider exposure as well as to preserve personal protective equipment.   Molly [name] has verbally consented to this TeleHealth visit. The patient is located at home, the provider is located at the [facility] office. The participants in this visit include the listed provider and patient. The visit was conducted today via MyChart video.   Chief Complaint: OBESITY Molly [name] is here to discuss her progress with her obesity treatment plan along with follow-up of her obesity related diagnoses. Molly [name] is on the Category 2 Plan and states she is following her eating plan approximately 85% of the time. Molly [name] states she walks and use resistance bands for 10-15 minutes 2-3 times per week.  Today's visit was #: 56 Starting weight: 223 lbs Starting date: 08/31/2017  Interim History: Molly [name] reports that she indulged in "finger foods" while watching football the past couple of weekends. She continues to try to eat more protein throughout the day. She reports a weight of 204 lbs today.  Subjective:   1. Hypertension associated with hyperlipidemia ([place]) Molly [name] checks her blood pressure at home. She states her blood pressure this morning was 138/78. She denies chest pain or headache. She is on multiple medications and she is managed by her primary care provider.  2. Type 2 diabetes mellitus with other specified complication, without long-term current use of insulin (HCC) [name]'s fasting blood sugar this morning was 134. She is on metformin, and she denies hypoglycemia or polyphagia. Last A1c was 6.2. I discussed labs with the patient today.  Assessment/Plan:   1. Hypertension associated with hyperlipidemia ([place]) Molly [name] is working on healthy weight loss and exercise to improve blood pressure control. We will watch for signs of hypotension as she continues her lifestyle  modifications. Molly [name] will continue to follow up with her primary care provider, and will monitor her blood pressure at home.  2. Type 2 diabetes mellitus with other specified complication, without long-term current use of insulin (HCC) Good blood sugar control is important to decrease the likelihood of diabetic complications such as nephropathy, neuropathy, limb loss, blindness, coronary artery disease, and death. Intensive lifestyle modification including diet, exercise and weight loss are the first line of treatment for diabetes. Molly [name] will continue to monitor her blood sugar and medications.   3. Class 2 severe obesity with serious comorbidity and body mass index (BMI) of 36.0 to 36.9 in adult, unspecified obesity type [facility]) Molly [name] is currently in the action stage of change. As such, her goal is to continue with weight loss efforts. She has agreed to the Category 2 Plan.   Exercise goals: As is.  Behavioral modification strategies: increasing lean protein intake and decreasing simple carbohydrates.  Molly [name] has agreed to follow-up with our clinic in 2 to 3 weeks. She was informed of the importance of frequent follow-up visits to maximize her success with intensive lifestyle modifications for her multiple health conditions.  Objective:   VITALS: Per patient if applicable, see vitals. GENERAL: Alert and in no acute distress. CARDIOPULMONARY: No increased WOB. Speaking in clear sentences.  PSYCH: Pleasant and cooperative. Speech normal rate and rhythm. Affect is appropriate. Insight and judgement are appropriate. Attention is focused, linear, and appropriate.  NEURO: Oriented as arrived to appointment on time with no prompting.   Lab Results  Component Value Date   CREATININE 1.01 (H) 07/10/2020   BUN 15 07/10/2020   NA 143  07/10/2020   K 3.6 07/10/2020   CL 101 07/10/2020   CO2 28 07/10/2020   Lab Results  Component Value Date   ALT 18 07/10/2020   AST 19 07/10/2020   ALKPHOS 54  07/10/2020   BILITOT 0.4 07/10/2020   Lab Results  Component Value Date   HGBA1C 6.2 (H) 07/10/2020   HGBA1C 5.9 (H) 12/14/2019   HGBA1C 5.9 (H) 06/05/2019   HGBA1C 6.1 (H) 09/29/2018   HGBA1C 5.9 (H) 05/05/2018   Lab Results  Component Value Date   INSULIN 12.0 07/10/2020   INSULIN 16.0 12/14/2019   INSULIN 22.5 06/05/2019   INSULIN 19.0 09/29/2018   INSULIN 17.2 05/05/2018   Lab Results  Component Value Date   TSH 1.680 08/31/2017   Lab Results  Component Value Date   CHOL 163 07/10/2020   HDL 59 07/10/2020   LDLCALC 85 07/10/2020   TRIG 107 07/10/2020   CHOLHDL 2.8 07/10/2020   Lab Results  Component Value Date   WBC 4.4 09/12/2017   HGB 15.0 09/12/2017   HCT 43.7 09/12/2017   MCV 92.6 09/12/2017   PLT 173 09/12/2017   No results found for: IRON, TIBC, FERRITIN  Attestation Statements:   Reviewed by clinician on day of visit: allergies, medications, problem list, medical history, surgical history, family history, social history, and previous encounter notes.   Wilhemena Durie, am acting as transcriptionist for [employer], PA-C.  I have reviewed the above documentation for accuracy and completeness, and I agree with the above. Abby Potash, PA-C

## 2020-09-25 ENCOUNTER — Ambulatory Visit (INDEPENDENT_AMBULATORY_CARE_PROVIDER_SITE_OTHER): Payer: Medicare HMO | Admitting: Physician Assistant

## 2020-09-25 ENCOUNTER — Other Ambulatory Visit: Payer: Self-pay

## 2020-09-25 ENCOUNTER — Encounter (INDEPENDENT_AMBULATORY_CARE_PROVIDER_SITE_OTHER): Payer: Self-pay | Admitting: Physician Assistant

## 2020-09-25 VITALS — BP 136/79 | HR 105 | Temp 98.1°F | Ht 63.0 in | Wt 208.0 lb

## 2020-09-25 DIAGNOSIS — E1169 Type 2 diabetes mellitus with other specified complication: Secondary | ICD-10-CM

## 2020-09-25 DIAGNOSIS — E785 Hyperlipidemia, unspecified: Secondary | ICD-10-CM

## 2020-09-25 DIAGNOSIS — Z6837 Body mass index (BMI) 37.0-37.9, adult: Secondary | ICD-10-CM | POA: Diagnosis not present

## 2020-09-25 MED ORDER — GLUCOSE MANAGEMENT PO TABS: ORAL_TABLET | ORAL | 0 refills | Status: DC

## 2020-09-26 NOTE — Progress Notes (Signed)
Chief Complaint:   OBESITY Molly [name] is here to discuss her progress with her obesity treatment plan along with follow-up of her obesity related diagnoses. Molly [name] is on the Category 2 Plan and states she is following her eating plan approximately 75% of the time. Molly [name] states she is doing stretches and walking on the treadmill for 20 minutes 2 times per week.  Today's visit was #: 30 Starting weight: 223 lbs Starting date: 08/31/2017 Today's weight: 208 lbs Today's date: 09/25/2020 Total lbs lost to date: 15 Total lbs lost since last in-office visit: 0  Interim History: Molly [name] has not been in our office since 12/21. She reports that she did well in January, but fell off the plan in February after being sick and she wasn't  eating very much.  Subjective:   1. Type 2 diabetes mellitus with hyperlipidemia (HCC) Molly [name] is on metformin, and she notes one episode of hypoglycemia with BGs at 65 due to not eating. Her fasting BGs are averaging at 100.  2. Hyperlipidemia associated with type 2 diabetes mellitus (Molly [name]) Molly [name] is on Crestor, and she denies myalgias or chest pain.  Assessment/Plan:   1. Type 2 diabetes mellitus with hyperlipidemia (HCC) Good blood sugar control is important to decrease the likelihood of diabetic complications such as nephropathy, neuropathy, limb loss, blindness, coronary artery disease, and death. Intensive lifestyle modification including diet, exercise and weight loss are the first line of treatment for diabetes. Molly [name] agreed to start glucose tablets OTC as needed with no refills.  - Nutritional Supplements (GLUCOSE MANAGEMENT) TABS; Take one daily as needed  Dispense: 30 tablet; Refill: 0  2. Hyperlipidemia associated with type 2 diabetes mellitus (Molly [name]) Cardiovascular risk and specific lipid/LDL goals reviewed. We discussed several lifestyle modifications today. Molly [name] will continue her medications and meal plan, and will continue to work on exercise and weight loss  efforts. Orders and follow up as documented in patient record.   Counseling Intensive lifestyle modifications are the first line treatment for this issue. . Dietary changes: Increase soluble fiber. Decrease simple carbohydrates. . Exercise changes: Moderate to vigorous-intensity aerobic activity 150 minutes per week if tolerated. . Lipid-lowering medications: see documented in medical record.  3. Class 2 severe obesity with serious comorbidity and body mass index (BMI) of 37.0 to 37.9 in adult, unspecified obesity type Molly [name]) Molly [name] is currently in the action stage of change. As such, her goal is to continue with weight loss efforts. She has agreed to the Category 2 Plan.   Exercise goals: As is.  Behavioral modification strategies: meal planning and cooking strategies and keeping healthy foods in the home.  Molly [name] has agreed to follow-up with our clinic in 3 weeks. She was informed of the importance of frequent follow-up visits to maximize her success with intensive lifestyle modifications for her multiple health conditions.   Objective:   Blood pressure 136/79, pulse (!) 105, temperature 98.1 F (36.7 C), height 5\' 3"  (1.6 m), weight 208 lb (94.3 kg), SpO2 98 %. Body mass index is 36.85 kg/m.  General: Cooperative, alert, well developed, in no acute distress. HEENT: Conjunctivae and lids unremarkable. Cardiovascular: Regular rhythm.  Lungs: Normal work of breathing. Neurologic: No focal deficits.   Lab Results  Component Value Date   CREATININE 1.01 (H) 07/10/2020   BUN 15 07/10/2020   NA 143 07/10/2020   K 3.6 07/10/2020   CL 101 07/10/2020   CO2 28 07/10/2020   Lab Results  Component Value Date   ALT 18  07/10/2020   AST 19 07/10/2020   ALKPHOS 54 07/10/2020   BILITOT 0.4 07/10/2020   Lab Results  Component Value Date   HGBA1C 6.2 (H) 07/10/2020   HGBA1C 5.9 (H) 12/14/2019   HGBA1C 5.9 (H) 06/05/2019   HGBA1C 6.1 (H) 09/29/2018   HGBA1C 5.9 (H) 05/05/2018   Lab  Results  Component Value Date   INSULIN 12.0 07/10/2020   INSULIN 16.0 12/14/2019   INSULIN 22.5 06/05/2019   INSULIN 19.0 09/29/2018   INSULIN 17.2 05/05/2018   Lab Results  Component Value Date   TSH 1.680 08/31/2017   Lab Results  Component Value Date   CHOL 163 07/10/2020   HDL 59 07/10/2020   LDLCALC 85 07/10/2020   TRIG 107 07/10/2020   CHOLHDL 2.8 07/10/2020   Lab Results  Component Value Date   WBC 4.4 09/12/2017   HGB 15.0 09/12/2017   HCT 43.7 09/12/2017   MCV 92.6 09/12/2017   PLT 173 09/12/2017   No results found for: IRON, TIBC, FERRITIN  Obesity Behavioral Intervention:   Approximately 15 minutes were spent on the discussion below.  ASK: We discussed the diagnosis of obesity with Molly [name] today and Molly [name] agreed to give [name] permission to discuss obesity behavioral modification therapy today.  ASSESS: Molly [name] has the diagnosis of obesity and her BMI today is 36.85. Molly [name] is in the action stage of change.   ADVISE: Molly [name] was educated on the multiple health risks of obesity as well as the benefit of weight loss to improve her health. She was advised of the need for long term treatment and the importance of lifestyle modifications to improve her current health and to decrease her risk of future health problems.  AGREE: Multiple dietary modification options and treatment options were discussed and Molly [name] agreed to follow the recommendations documented in the above note.  ARRANGE: Molly [name] was educated on the importance of frequent visits to treat obesity as outlined per CMS and USPSTF guidelines and agreed to schedule her next follow up appointment today.  Attestation Statements:   Reviewed by clinician on day of visit: allergies, medications, problem list, medical history, surgical history, family history, social history, and previous encounter notes.   Wilhemena Durie, am acting as transcriptionist for [employer], PA-C.  I have reviewed the above documentation  for accuracy and completeness, and I agree with the above. Abby Potash, PA-C

## 2020-10-22 ENCOUNTER — Ambulatory Visit (INDEPENDENT_AMBULATORY_CARE_PROVIDER_SITE_OTHER): Payer: Medicare HMO | Admitting: Physician Assistant

## 2020-10-22 ENCOUNTER — Encounter (INDEPENDENT_AMBULATORY_CARE_PROVIDER_SITE_OTHER): Payer: Self-pay | Admitting: Physician Assistant

## 2020-10-22 ENCOUNTER — Other Ambulatory Visit: Payer: Self-pay

## 2020-10-22 VITALS — BP 147/80 | HR 99 | Temp 98.3°F | Ht 63.0 in | Wt 206.0 lb

## 2020-10-22 DIAGNOSIS — E7849 Other hyperlipidemia: Secondary | ICD-10-CM

## 2020-10-22 DIAGNOSIS — E782 Mixed hyperlipidemia: Secondary | ICD-10-CM

## 2020-10-22 DIAGNOSIS — Z6837 Body mass index (BMI) 37.0-37.9, adult: Secondary | ICD-10-CM

## 2020-10-22 DIAGNOSIS — E1169 Type 2 diabetes mellitus with other specified complication: Secondary | ICD-10-CM

## 2020-10-22 DIAGNOSIS — E66812 Obesity, class 2: Secondary | ICD-10-CM

## 2020-10-24 DIAGNOSIS — Z0100 Encounter for examination of eyes and vision without abnormal findings: Secondary | ICD-10-CM | POA: Diagnosis not present

## 2020-10-24 DIAGNOSIS — H524 Presbyopia: Secondary | ICD-10-CM | POA: Diagnosis not present

## 2020-10-28 DIAGNOSIS — E118 Type 2 diabetes mellitus with unspecified complications: Secondary | ICD-10-CM | POA: Diagnosis not present

## 2020-10-28 DIAGNOSIS — D696 Thrombocytopenia, unspecified: Secondary | ICD-10-CM | POA: Diagnosis not present

## 2020-10-28 DIAGNOSIS — I10 Essential (primary) hypertension: Secondary | ICD-10-CM | POA: Diagnosis not present

## 2020-10-28 DIAGNOSIS — E785 Hyperlipidemia, unspecified: Secondary | ICD-10-CM | POA: Diagnosis not present

## 2020-10-28 NOTE — Progress Notes (Signed)
Chief Complaint:   OBESITY Hildegarde is here to discuss her progress with her obesity treatment plan along with follow-up of her obesity related diagnoses. Shannell is on the Category 2 Plan and states she is following her eating plan approximately 75% of the time. Dorothe states she is stretching and doing bar bells for 15 minutes 3 times per week.  Today's visit was #: 42 Starting weight: 223 lbs Starting date: 08/31/2017 Today's weight: 206 lbs Today's date: 10/22/2020 Total lbs lost to date: 17 Total lbs lost since last in-office visit: 2  Interim History: Eryca has had 3 deaths in her family in the past month. She states that she did well the first week after our last visit then fell off a little with everything going on. She is going to start exercising at the [facility].  Subjective:   1. DM type 2 with diabetic with mixed hyperlipidemia ([place]) Lyllian is on metformin, and she is tolerating it well. Her fasting BGs averages between 98 and 103.  2. Other hyperlipidemia Shakthi is on Crestor, and she denies myalgias or chest pain.  Assessment/Plan:   1. DM type 2 with diabetic with mixed hyperlipidemia ([place]) Good blood sugar control is important to decrease the likelihood of diabetic complications such as nephropathy, neuropathy, limb loss, blindness, coronary artery disease, and death. Intensive lifestyle modification including diet, exercise and weight loss are the first line of treatment for diabetes. Ellar will continue her medications and meal plan.  2. Other hyperlipidemia Cardiovascular risk and specific lipid/LDL goals reviewed. We discussed several lifestyle modifications today. Satina will continue her medications and meal plan, and will continue to work on increasing exercise and weight loss efforts. Orders and follow up as documented in patient record.   Counseling Intensive lifestyle modifications are the first line treatment for this issue. . Dietary changes: Increase soluble fiber.  Decrease simple carbohydrates. . Exercise changes: Moderate to vigorous-intensity aerobic activity 150 minutes per week if tolerated. . Lipid-lowering medications: see documented in medical record.  3. Class 2 severe obesity with serious comorbidity and body mass index (BMI) of 37.0 to 37.9 in adult, unspecified obesity type [facility]) Renate is currently in the action stage of change. As such, her goal is to continue with weight loss efforts. She has agreed to the Category 2 Plan.   Exercise goals: As is.  Behavioral modification strategies: meal planning and cooking strategies.  Jewelz has agreed to follow-up with our clinic in 3 weeks. She was informed of the importance of frequent follow-up visits to maximize her success with intensive lifestyle modifications for her multiple health conditions.   Objective:   Blood pressure (!) 147/80, pulse 99, temperature 98.3 F (36.8 C), height 5\' 3"  (1.6 m), weight 206 lb (93.4 kg), SpO2 99 %. Body mass index is 36.49 kg/m.  General: Cooperative, alert, well developed, in no acute distress. HEENT: Conjunctivae and lids unremarkable. Cardiovascular: Regular rhythm.  Lungs: Normal work of breathing. Neurologic: No focal deficits.   Lab Results  Component Value Date   CREATININE 1.01 (H) 07/10/2020   BUN 15 07/10/2020   NA 143 07/10/2020   K 3.6 07/10/2020   CL 101 07/10/2020   CO2 28 07/10/2020   Lab Results  Component Value Date   ALT 18 07/10/2020   AST 19 07/10/2020   ALKPHOS 54 07/10/2020   BILITOT 0.4 07/10/2020   Lab Results  Component Value Date   HGBA1C 6.2 (H) 07/10/2020   HGBA1C 5.9 (H) 12/14/2019  HGBA1C 5.9 (H) 06/05/2019   HGBA1C 6.1 (H) 09/29/2018   HGBA1C 5.9 (H) 05/05/2018   Lab Results  Component Value Date   INSULIN 12.0 07/10/2020   INSULIN 16.0 12/14/2019   INSULIN 22.5 06/05/2019   INSULIN 19.0 09/29/2018   INSULIN 17.2 05/05/2018   Lab Results  Component Value Date   TSH 1.680 08/31/2017   Lab Results   Component Value Date   CHOL 163 07/10/2020   HDL 59 07/10/2020   LDLCALC 85 07/10/2020   TRIG 107 07/10/2020   CHOLHDL 2.8 07/10/2020   Lab Results  Component Value Date   WBC 4.4 09/12/2017   HGB 15.0 09/12/2017   HCT 43.7 09/12/2017   MCV 92.6 09/12/2017   PLT 173 09/12/2017   No results found for: IRON, TIBC, FERRITIN  Obesity Behavioral Intervention:   Approximately 15 minutes were spent on the discussion below.  ASK: We discussed the diagnosis of obesity with [name] today and Madison agreed to give [name] permission to discuss obesity behavioral modification therapy today.  ASSESS: Gerre has the diagnosis of obesity and her BMI today is 36.5. Marybell is in the action stage of change.   ADVISE: Dorrie was educated on the multiple health risks of obesity as well as the benefit of weight loss to improve her health. She was advised of the need for long term treatment and the importance of lifestyle modifications to improve her current health and to decrease her risk of future health problems.  AGREE: Multiple dietary modification options and treatment options were discussed and Rhegan agreed to follow the recommendations documented in the above note.  ARRANGE: Laycee was educated on the importance of frequent visits to treat obesity as outlined per CMS and USPSTF guidelines and agreed to schedule her next follow up appointment today.  Attestation Statements:   Reviewed by clinician on day of visit: allergies, medications, problem list, medical history, surgical history, family history, social history, and previous encounter notes.   Wilhemena Durie, am acting as transcriptionist for [employer], PA-C.  I have reviewed the above documentation for accuracy and completeness, and I agree with the above. Abby Potash, PA-C

## 2020-11-14 ENCOUNTER — Ambulatory Visit (INDEPENDENT_AMBULATORY_CARE_PROVIDER_SITE_OTHER): Payer: Medicare HMO | Admitting: Physician Assistant

## 2020-12-11 ENCOUNTER — Ambulatory Visit (INDEPENDENT_AMBULATORY_CARE_PROVIDER_SITE_OTHER): Payer: Medicare HMO | Admitting: Physician Assistant

## 2020-12-11 ENCOUNTER — Encounter (INDEPENDENT_AMBULATORY_CARE_PROVIDER_SITE_OTHER): Payer: Self-pay | Admitting: Physician Assistant

## 2020-12-11 ENCOUNTER — Other Ambulatory Visit: Payer: Self-pay

## 2020-12-11 VITALS — BP 128/75 | HR 87 | Temp 98.0°F | Ht 63.0 in | Wt 208.0 lb

## 2020-12-11 DIAGNOSIS — Z6837 Body mass index (BMI) 37.0-37.9, adult: Secondary | ICD-10-CM

## 2020-12-11 DIAGNOSIS — E559 Vitamin D deficiency, unspecified: Secondary | ICD-10-CM | POA: Diagnosis not present

## 2020-12-11 NOTE — Progress Notes (Signed)
Chief Complaint:   OBESITY Molly [name] is here to discuss her progress with her obesity treatment plan along with follow-up of her obesity related diagnoses. Molly [name] is on the Category 2 Plan and states she is following her eating plan approximately 75% of the time. Molly [name] states she is doing yoga for 45 minutes 1-2 times per week.  Today's visit was #: 69 Starting weight: 223 lbs Starting date: 08/31/2017 Today's weight: 208 lbs Today's date: 12/11/2020 Total lbs lost to date: 15 Total lbs lost since last in-office visit: 0  Interim History: Molly [name] had a birthday celebration and Mother's Day celebrations, and she states she overate. Sometimes she struggles to get all of her protein in. She is ready to get back on track.  Subjective:   1. Vitamin D deficiency Molly [name] is on Vit D every other day, and she is tolerating it well.  Assessment/Plan:   1. Vitamin D deficiency Low Vitamin D level contributes to fatigue and are associated with obesity, breast, and colon cancer. Molly [name] will continue with OTC Vitamin D and will follow-up for routine testing of Vitamin D, at least 2-3 times per year to avoid over-replacement.  2. Class 2 severe obesity with serious comorbidity and body mass index (BMI) of 37.0 to 37.9 in adult, unspecified obesity type Molly [name]) Molly [name] is currently in the action stage of change. As such, her goal is to continue with weight loss efforts. She has agreed to the Category 2 Plan.   We will recheck her IC at her next visit.  Exercise goals: As is.  Behavioral modification strategies: meal planning and cooking strategies and keeping healthy foods in the home.  Molly [name] has agreed to follow-up with our clinic in 3 weeks. She was informed of the importance of frequent follow-up visits to maximize her success with intensive lifestyle modifications for her multiple health conditions.   Objective:   Blood pressure 128/75, pulse 87, temperature 98 F (36.7 C), height 5\' 3"  (1.6 m), weight 208  lb (94.3 kg), SpO2 99 %. Body mass index is 36.85 kg/m.  General: Cooperative, alert, well developed, in no acute distress. HEENT: Conjunctivae and lids unremarkable. Cardiovascular: Regular rhythm.  Lungs: Normal work of breathing. Neurologic: No focal deficits.   Lab Results  Component Value Date   CREATININE 1.01 (H) 07/10/2020   BUN 15 07/10/2020   NA 143 07/10/2020   K 3.6 07/10/2020   CL 101 07/10/2020   CO2 28 07/10/2020   Lab Results  Component Value Date   ALT 18 07/10/2020   AST 19 07/10/2020   ALKPHOS 54 07/10/2020   BILITOT 0.4 07/10/2020   Lab Results  Component Value Date   HGBA1C 6.2 (H) 07/10/2020   HGBA1C 5.9 (H) 12/14/2019   HGBA1C 5.9 (H) 06/05/2019   HGBA1C 6.1 (H) 09/29/2018   HGBA1C 5.9 (H) 05/05/2018   Lab Results  Component Value Date   INSULIN 12.0 07/10/2020   INSULIN 16.0 12/14/2019   INSULIN 22.5 06/05/2019   INSULIN 19.0 09/29/2018   INSULIN 17.2 05/05/2018   Lab Results  Component Value Date   TSH 1.680 08/31/2017   Lab Results  Component Value Date   CHOL 163 07/10/2020   HDL 59 07/10/2020   LDLCALC 85 07/10/2020   TRIG 107 07/10/2020   CHOLHDL 2.8 07/10/2020   Lab Results  Component Value Date   WBC 4.4 09/12/2017   HGB 15.0 09/12/2017   HCT 43.7 09/12/2017   MCV 92.6 09/12/2017   PLT 173 09/12/2017  No results found for: IRON, TIBC, FERRITIN  Obesity Behavioral Intervention:   Approximately 15 minutes were spent on the discussion below.  ASK: We discussed the diagnosis of obesity with Molly [name] today and Molly [name] agreed to give [name] permission to discuss obesity behavioral modification therapy today.  ASSESS: Molly [name] has the diagnosis of obesity and her BMI today is 36.85. Molly [name] is in the action stage of change.   ADVISE: Molly [name] was educated on the multiple health risks of obesity as well as the benefit of weight loss to improve her health. She was advised of the need for long term treatment and the importance of lifestyle  modifications to improve her current health and to decrease her risk of future health problems.  AGREE: Multiple dietary modification options and treatment options were discussed and Molly [name] agreed to follow the recommendations documented in the above note.  ARRANGE: Molly [name] was educated on the importance of frequent visits to treat obesity as outlined per CMS and USPSTF guidelines and agreed to schedule her next follow up appointment today.  Attestation Statements:   Reviewed by clinician on day of visit: allergies, medications, problem list, medical history, surgical history, family history, social history, and previous encounter notes.   Wilhemena Durie, am acting as transcriptionist for [employer], PA-C.  I have reviewed the above documentation for accuracy and completeness, and I agree with the above. Molly Potash, PA-C

## 2020-12-19 DIAGNOSIS — Z961 Presence of intraocular lens: Secondary | ICD-10-CM | POA: Diagnosis not present

## 2020-12-19 DIAGNOSIS — E113292 Type 2 diabetes mellitus with mild nonproliferative diabetic retinopathy without macular edema, left eye: Secondary | ICD-10-CM | POA: Diagnosis not present

## 2020-12-19 DIAGNOSIS — H40022 Open angle with borderline findings, high risk, left eye: Secondary | ICD-10-CM | POA: Diagnosis not present

## 2021-01-02 DIAGNOSIS — Z1231 Encounter for screening mammogram for malignant neoplasm of breast: Secondary | ICD-10-CM | POA: Diagnosis not present

## 2021-01-20 ENCOUNTER — Encounter (INDEPENDENT_AMBULATORY_CARE_PROVIDER_SITE_OTHER): Payer: Self-pay | Admitting: Physician Assistant

## 2021-01-20 ENCOUNTER — Ambulatory Visit (INDEPENDENT_AMBULATORY_CARE_PROVIDER_SITE_OTHER): Payer: Medicare HMO | Admitting: Physician Assistant

## 2021-01-20 ENCOUNTER — Other Ambulatory Visit: Payer: Self-pay

## 2021-01-20 VITALS — BP 139/80 | HR 94 | Temp 98.3°F | Ht 63.0 in | Wt 208.0 lb

## 2021-01-20 DIAGNOSIS — E782 Mixed hyperlipidemia: Secondary | ICD-10-CM

## 2021-01-20 DIAGNOSIS — E1169 Type 2 diabetes mellitus with other specified complication: Secondary | ICD-10-CM

## 2021-01-20 DIAGNOSIS — Z6837 Body mass index (BMI) 37.0-37.9, adult: Secondary | ICD-10-CM

## 2021-01-20 DIAGNOSIS — E559 Vitamin D deficiency, unspecified: Secondary | ICD-10-CM

## 2021-01-20 DIAGNOSIS — R0602 Shortness of breath: Secondary | ICD-10-CM

## 2021-01-20 DIAGNOSIS — E7849 Other hyperlipidemia: Secondary | ICD-10-CM

## 2021-01-21 LAB — COMPREHENSIVE METABOLIC PANEL
ALT: 17 IU/L (ref 0–32)
AST: 21 IU/L (ref 0–40)
Albumin/Globulin Ratio: 1.8 (ref 1.2–2.2)
Albumin: 4.7 g/dL (ref 3.7–4.7)
Alkaline Phosphatase: 58 IU/L (ref 44–121)
BUN/Creatinine Ratio: 13 (ref 12–28)
BUN: 12 mg/dL (ref 8–27)
Bilirubin Total: 0.5 mg/dL (ref 0.0–1.2)
CO2: 26 mmol/L (ref 20–29)
Calcium: 10.4 mg/dL — ABNORMAL HIGH (ref 8.7–10.3)
Chloride: 101 mmol/L (ref 96–106)
Creatinine, Ser: 0.95 mg/dL (ref 0.57–1.00)
Globulin, Total: 2.6 g/dL (ref 1.5–4.5)
Glucose: 133 mg/dL — ABNORMAL HIGH (ref 65–99)
Potassium: 3.1 mmol/L — ABNORMAL LOW (ref 3.5–5.2)
Sodium: 141 mmol/L (ref 134–144)
Total Protein: 7.3 g/dL (ref 6.0–8.5)
eGFR: 62 mL/min/{1.73_m2} (ref >59)

## 2021-01-21 LAB — VITAMIN D 25 HYDROXY (VIT D DEFICIENCY, FRACTURES): Vit D, 25-Hydroxy: 48.9 ng/mL (ref 30.0–100.0)

## 2021-01-21 LAB — HEMOGLOBIN A1C
Est. average glucose Bld gHb Est-mCnc: 134 mg/dL
Hgb A1c MFr Bld: 6.3 % — ABNORMAL HIGH (ref 4.8–5.6)

## 2021-01-21 LAB — LIPID PANEL
Chol/HDL Ratio: 2.6 ratio (ref 0.0–4.4)
Cholesterol, Total: 166 mg/dL (ref 100–199)
HDL: 64 mg/dL (ref >39)
LDL Chol Calc (NIH): 84 mg/dL (ref 0–99)
Triglycerides: 99 mg/dL (ref 0–149)
VLDL Cholesterol Cal: 18 mg/dL (ref 5–40)

## 2021-01-21 LAB — INSULIN, RANDOM: INSULIN: 17.6 u[IU]/mL (ref 2.6–24.9)

## 2021-01-22 NOTE — Progress Notes (Signed)
Chief Complaint:   OBESITY Molly [name] is here to discuss her progress with her obesity treatment plan along with follow-up of her obesity related diagnoses. Molly [name] is on the Category 2 Plan and states she is following her eating plan approximately 0% of the time. Molly [name] states she is doing yoga for 45 minutes 2 times per week, and swimming for 60 minutes 1 time per week.  Today's visit was #: 84 Starting weight: 223 lbs Starting date: 08/31/2017 Today's weight: 208 lbs Today's date: 01/20/2021 Total lbs lost to date: 15 Total lbs lost since last in-office visit: 0  Interim History: Molly [name] reports that she is not hungry and does not feel that she has been eating enough. She misses breakfast some days. She is leaving for the beach tomorrow.  Subjective:   1. Other hyperlipidemia Molly [name] is on Crestor, and she denies chest pain.  2. Vitamin D deficiency Molly [name] is on Vit D. Last Vit D level was not at goal.  3. DM type 2 with diabetic with mixed hyperlipidemia (HCC) Molly [name] is on metformin, and she denies nausea, vomiting, or muscle weakness. Last A1c was 6.2.  4. Shortness of breath on exertion Molly [name] notes shortness of breath with exertion. She denies dizziness or lightheadedness.  Assessment/Plan:   1. Other hyperlipidemia Cardiovascular risk and specific lipid/LDL goals reviewed. We discussed several lifestyle modifications today. We will check labs today. Molly [name] will continue Crestor, and will continue to work on diet, exercise and weight loss efforts. Orders and follow up as documented in patient record.   Counseling Intensive lifestyle modifications are the first line treatment for this issue. Dietary changes: Increase soluble fiber. Decrease simple carbohydrates. Exercise changes: Moderate to vigorous-intensity aerobic activity 150 minutes per week if tolerated. Lipid-lowering medications: see documented in medical record.  - Lipid panel  2. Vitamin D deficiency Low Vitamin D level  contributes to fatigue and are associated with obesity, breast, and colon cancer. We will check labs today. Molly [name] will continue Vitamin D and will follow-up for routine testing of Vitamin D, at least 2-3 times per year to avoid over-replacement.  - VITAMIN D 25 Hydroxy (Vit-D Deficiency, Fractures)  3. DM type 2 with diabetic with mixed hyperlipidemia (HCC) We will check labs today. Molly [name] will continue to follow up as directed. Good blood sugar control is important to decrease the likelihood of diabetic complications such as nephropathy, neuropathy, limb loss, blindness, coronary artery disease, and death. Intensive lifestyle modification including diet, exercise and weight loss are the first line of treatment for diabetes.   - Comprehensive metabolic panel - Hemoglobin A1c - Insulin, random  4. Shortness of breath on exertion IC was done today. [name] has agreed to work on weight loss and gradually increase exercise to treat her exercise induced shortness of breath. Will continue to monitor closely.  5. Class 2 severe obesity with serious comorbidity and body mass index (BMI) of 37.0 to 37.9 in adult, unspecified obesity type [name]) Molly [name] is currently in the action stage of change. As such, her goal is to continue with weight loss efforts. She has agreed to the Category 1 Plan.   Exercise goals: As is.  Behavioral modification strategies: increasing lean protein intake and no skipping meals.  Molly [name] has agreed to follow-up with our clinic in 3 weeks. She was informed of the importance of frequent follow-up visits to maximize her success with intensive lifestyle modifications for her multiple health conditions.   Molly [name] was informed we would discuss her lab  results at her next visit unless there is a critical issue that needs to be addressed sooner. Molly [name] agreed to keep her next visit at the agreed upon time to discuss these results.  Objective:   Blood pressure 139/80, pulse 94, temperature 98.3 F  (36.8 C), height 5\' 3"  (1.6 m), weight 208 lb (94.3 kg), SpO2 98 %. Body mass index is 36.85 kg/m.  General: Cooperative, alert, well developed, in no acute distress. HEENT: Conjunctivae and lids unremarkable. Cardiovascular: Regular rhythm.  Lungs: Normal work of breathing. Neurologic: No focal deficits.   Lab Results  Component Value Date   CREATININE 0.95 01/20/2021   BUN 12 01/20/2021   NA 141 01/20/2021   K 3.1 (L) 01/20/2021   CL 101 01/20/2021   CO2 26 01/20/2021   Lab Results  Component Value Date   ALT 17 01/20/2021   AST 21 01/20/2021   ALKPHOS 58 01/20/2021   BILITOT 0.5 01/20/2021   Lab Results  Component Value Date   HGBA1C 6.3 (H) 01/20/2021   HGBA1C 6.2 (H) 07/10/2020   HGBA1C 5.9 (H) 12/14/2019   HGBA1C 5.9 (H) 06/05/2019   HGBA1C 6.1 (H) 09/29/2018   Lab Results  Component Value Date   INSULIN 17.6 01/20/2021   INSULIN 12.0 07/10/2020   INSULIN 16.0 12/14/2019   INSULIN 22.5 06/05/2019   INSULIN 19.0 09/29/2018   Lab Results  Component Value Date   TSH 1.680 08/31/2017   Lab Results  Component Value Date   CHOL 166 01/20/2021   HDL 64 01/20/2021   LDLCALC 84 01/20/2021   TRIG 99 01/20/2021   CHOLHDL 2.6 01/20/2021   Lab Results  Component Value Date   VD25OH 48.9 01/20/2021   VD25OH 47.9 07/10/2020   VD25OH 50.7 12/14/2019   Lab Results  Component Value Date   WBC 4.4 09/12/2017   HGB 15.0 09/12/2017   HCT 43.7 09/12/2017   MCV 92.6 09/12/2017   PLT 173 09/12/2017   No results found for: IRON, TIBC, FERRITIN  Obesity Behavioral Intervention:   Approximately 15 minutes were spent on the discussion below.  ASK: We discussed the diagnosis of obesity with Molly [name] today and Molly [name] agreed to give [name] permission to discuss obesity behavioral modification therapy today.  ASSESS: Molly [name] has the diagnosis of obesity and her BMI today is 36.85. Molly [name] is in the action stage of change.   ADVISE: Molly [name] was educated on the multiple health  risks of obesity as well as the benefit of weight loss to improve her health. She was advised of the need for long term treatment and the importance of lifestyle modifications to improve her current health and to decrease her risk of future health problems.  AGREE: Multiple dietary modification options and treatment options were discussed and Jacquelyne agreed to follow the recommendations documented in the above note.  ARRANGE: Lavana was educated on the importance of frequent visits to treat obesity as outlined per CMS and USPSTF guidelines and agreed to schedule her next follow up appointment today.  Attestation Statements:   Reviewed by clinician on day of visit: allergies, medications, problem list, medical history, surgical history, family history, social history, and previous encounter notes.   Wilhemena Durie, am acting as transcriptionist for [employer], PA-C.  I have reviewed the above documentation for accuracy and completeness, and I agree with the above. Abby Potash, PA-C

## 2021-02-10 ENCOUNTER — Ambulatory Visit (INDEPENDENT_AMBULATORY_CARE_PROVIDER_SITE_OTHER): Payer: Medicare HMO | Admitting: Physician Assistant

## 2021-02-10 ENCOUNTER — Encounter (INDEPENDENT_AMBULATORY_CARE_PROVIDER_SITE_OTHER): Payer: Self-pay | Admitting: Physician Assistant

## 2021-02-10 ENCOUNTER — Other Ambulatory Visit: Payer: Self-pay

## 2021-02-10 VITALS — BP 115/72 | HR 92 | Temp 97.3°F | Ht 63.0 in | Wt 209.0 lb

## 2021-02-10 DIAGNOSIS — E782 Mixed hyperlipidemia: Secondary | ICD-10-CM

## 2021-02-10 DIAGNOSIS — E1169 Type 2 diabetes mellitus with other specified complication: Secondary | ICD-10-CM

## 2021-02-10 DIAGNOSIS — Z6837 Body mass index (BMI) 37.0-37.9, adult: Secondary | ICD-10-CM

## 2021-02-10 DIAGNOSIS — E876 Hypokalemia: Secondary | ICD-10-CM

## 2021-02-11 LAB — POTASSIUM: Potassium: 3.3 mmol/L — ABNORMAL LOW (ref 3.5–5.2)

## 2021-02-18 NOTE — Progress Notes (Signed)
Chief Complaint:   OBESITY Molly [name] is here to discuss her progress with her obesity treatment plan along with follow-up of her obesity related diagnoses. Molly [name] is on the Category 1 Plan and states she is following her eating plan approximately 80% of the time. Molly [name] states she is doing yoga and swimming for 45 minutes 3 times per week.  Today's visit was #: 45 Starting weight: 223 lbs Starting date: 08/31/2017 Today's weight: 209 lbs Today's date: 02/10/2021 Total lbs lost to date: 14 Total lbs lost since last in-office visit: 0  Interim History: Molly [name] was on vacation for a week, and was indulging in sweets, especially ice cream.. she has struggled to get back on track since returning.  Subjective:   1. Hypokalemia Molly [name]'s last potassium level was 3.1. She denies palpitations or chest pain. I discussed labs with the patient today.  2. DM type 2 with diabetic with mixed hyperlipidemia (Molly [name]) Molly [name]'s last A1c was 6.3. Her appetite is controlled. She is on metformin, and she denies nausea, vomiting, or diarrhea. I discussed labs with the patient today.  Assessment/Plan:   1. Hypokalemia We will check labs today. Shaneeka will continue to follow up as directed.  - Potassium  2. DM type 2 with diabetic with mixed hyperlipidemia (HCC) Molly [name] will continue her meal plan and medications. Good blood sugar control is important to decrease the likelihood of diabetic complications such as nephropathy, neuropathy, limb loss, blindness, coronary artery disease, and death. Intensive lifestyle modification including diet, exercise and weight loss are the first line of treatment for diabetes.   3. Class 2 severe obesity with serious comorbidity and body mass index (BMI) of 37.0 to 37.9 in adult, unspecified obesity type ([place]), current bmi 37.03 Tucker is currently in the action stage of change. As such, her goal is to continue with weight loss efforts. She has agreed to the Category 1 Plan.   Exercise goals:  As is.  Behavioral modification strategies: meal planning and cooking strategies and keeping healthy foods in the home.  Molly [name] has agreed to follow-up with our clinic in 3 weeks. She was informed of the importance of frequent follow-up visits to maximize her success with intensive lifestyle modifications for her multiple health conditions.   Molly [name] was informed we would discuss her lab results at her next visit unless there is a critical issue that needs to be addressed sooner. Molly [name] agreed to keep her next visit at the agreed upon time to discuss these results.  Objective:   Blood pressure 115/72, pulse 92, temperature (!) 97.3 F (36.3 C), height '5\' 3"'$  (1.6 m), weight 209 lb (94.8 kg), SpO2 99 %. Body mass index is 37.02 kg/m.  General: Cooperative, alert, well developed, in no acute distress. HEENT: Conjunctivae and lids unremarkable. Cardiovascular: Regular rhythm.  Lungs: Normal work of breathing. Neurologic: No focal deficits.   Lab Results  Component Value Date   CREATININE 0.95 01/20/2021   BUN 12 01/20/2021   NA 141 01/20/2021   K 3.3 (L) 02/10/2021   CL 101 01/20/2021   CO2 26 01/20/2021   Lab Results  Component Value Date   ALT 17 01/20/2021   AST 21 01/20/2021   ALKPHOS 58 01/20/2021   BILITOT 0.5 01/20/2021   Lab Results  Component Value Date   HGBA1C 6.3 (H) 01/20/2021   HGBA1C 6.2 (H) 07/10/2020   HGBA1C 5.9 (H) 12/14/2019   HGBA1C 5.9 (H) 06/05/2019   HGBA1C 6.1 (H) 09/29/2018   Lab Results  Component Value Date   INSULIN 17.6 01/20/2021   INSULIN 12.0 07/10/2020   INSULIN 16.0 12/14/2019   INSULIN 22.5 06/05/2019   INSULIN 19.0 09/29/2018   Lab Results  Component Value Date   TSH 1.680 08/31/2017   Lab Results  Component Value Date   CHOL 166 01/20/2021   HDL 64 01/20/2021   LDLCALC 84 01/20/2021   TRIG 99 01/20/2021   CHOLHDL 2.6 01/20/2021   Lab Results  Component Value Date   VD25OH 48.9 01/20/2021   VD25OH 47.9 07/10/2020   VD25OH  50.7 12/14/2019   Lab Results  Component Value Date   WBC 4.4 09/12/2017   HGB 15.0 09/12/2017   HCT 43.7 09/12/2017   MCV 92.6 09/12/2017   PLT 173 09/12/2017   No results found for: IRON, TIBC, FERRITIN  Obesity Behavioral Intervention:   Approximately 15 minutes were spent on the discussion below.  ASK: We discussed the diagnosis of obesity with Molly [name] today and Darwin agreed to give [name] permission to discuss obesity behavioral modification therapy today.  ASSESS: Molly [name] has the diagnosis of obesity and her BMI today is 37.03. Molly [name] is in the action stage of change.   ADVISE: Molly [name] was educated on the multiple health risks of obesity as well as the benefit of weight loss to improve her health. She was advised of the need for long term treatment and the importance of lifestyle modifications to improve her current health and to decrease her risk of future health problems.  AGREE: Multiple dietary modification options and treatment options were discussed and Molly [name] agreed to follow the recommendations documented in the above note.  ARRANGE: Molly [name] was educated on the importance of frequent visits to treat obesity as outlined per CMS and USPSTF guidelines and agreed to schedule her next follow up appointment today.  Attestation Statements:   Reviewed by clinician on day of visit: allergies, medications, problem list, medical history, surgical history, family history, social history, and previous encounter notes.   Wilhemena Durie, am acting as transcriptionist for [employer], PA-C.  I have reviewed the above documentation for accuracy and completeness, and I agree with the above. Abby Potash, PA-C

## 2021-02-20 DIAGNOSIS — H40022 Open angle with borderline findings, high risk, left eye: Secondary | ICD-10-CM | POA: Diagnosis not present

## 2021-02-27 ENCOUNTER — Encounter (INDEPENDENT_AMBULATORY_CARE_PROVIDER_SITE_OTHER): Payer: Self-pay

## 2021-03-04 ENCOUNTER — Ambulatory Visit (INDEPENDENT_AMBULATORY_CARE_PROVIDER_SITE_OTHER): Payer: Medicare HMO | Admitting: Physician Assistant

## 2021-04-02 DIAGNOSIS — H81393 Other peripheral vertigo, bilateral: Secondary | ICD-10-CM | POA: Diagnosis not present

## 2021-04-02 DIAGNOSIS — Z87891 Personal history of nicotine dependence: Secondary | ICD-10-CM | POA: Diagnosis not present

## 2021-04-02 DIAGNOSIS — H8190 Unspecified disorder of vestibular function, unspecified ear: Secondary | ICD-10-CM | POA: Diagnosis not present

## 2021-04-02 DIAGNOSIS — E119 Type 2 diabetes mellitus without complications: Secondary | ICD-10-CM | POA: Diagnosis not present

## 2021-04-21 ENCOUNTER — Ambulatory Visit (INDEPENDENT_AMBULATORY_CARE_PROVIDER_SITE_OTHER): Payer: Medicare HMO | Admitting: Physician Assistant

## 2021-04-21 ENCOUNTER — Encounter (INDEPENDENT_AMBULATORY_CARE_PROVIDER_SITE_OTHER): Payer: Self-pay | Admitting: Physician Assistant

## 2021-04-21 ENCOUNTER — Other Ambulatory Visit: Payer: Self-pay

## 2021-04-21 VITALS — BP 134/78 | HR 93 | Temp 98.0°F | Ht 63.0 in | Wt 209.0 lb

## 2021-04-21 DIAGNOSIS — E559 Vitamin D deficiency, unspecified: Secondary | ICD-10-CM

## 2021-04-21 DIAGNOSIS — Z6837 Body mass index (BMI) 37.0-37.9, adult: Secondary | ICD-10-CM | POA: Diagnosis not present

## 2021-04-21 DIAGNOSIS — E1169 Type 2 diabetes mellitus with other specified complication: Secondary | ICD-10-CM | POA: Diagnosis not present

## 2021-04-21 DIAGNOSIS — E785 Hyperlipidemia, unspecified: Secondary | ICD-10-CM

## 2021-04-21 NOTE — Progress Notes (Signed)
Chief Complaint:   OBESITY Molly [name] is here to discuss her progress with her obesity treatment plan along with follow-up of her obesity related diagnoses. Molly [name] is on the Category 1 Plan and states she is following her eating plan approximately 50% of the time. Molly [name] states she is doing yoga for 45 minutes 2 times per week.  Today's visit was #: 20 Starting weight: 223 lbs Starting date: 08/31/2017 Today's weight: 209 lbs Today's date: 04/21/2021 Total lbs lost to date: 14 Total lbs lost since last in-office visit: 0  Interim History: Molly [name] reports that she is not eating enough as she has not had an appetite for the last 2 weeks, due to having a cold. She is ready to get back on track as her appetite is back to normal.  Subjective:   1. Vitamin D deficiency Molly [name] is on Vit D 5,000 units daily, and she is tolerating it well.  2. Hyperlipidemia associated with type 2 diabetes mellitus ([name]) Molly [name] is on Crestor daily, and she is tolerating it well. She is managed by her primary care provider.  Assessment/Plan:   1. Vitamin D deficiency Low Vitamin D level contributes to fatigue and are associated with obesity, breast, and colon cancer. Molly [name] agreed to continue Vitamin D 5,000 IU daily and will follow-up for routine testing of Vitamin D, at least 2-3 times per year to avoid over-replacement.  2. Hyperlipidemia associated with type 2 diabetes mellitus ([name]) Cardiovascular risk and specific lipid/LDL goals reviewed. We discussed several lifestyle modifications today. Molly [name] will continue her medications and her meal plan. Orders and follow up as documented in patient record.   Counseling Intensive lifestyle modifications are the first line treatment for this issue. Dietary changes: Increase soluble fiber. Decrease simple carbohydrates. Exercise changes: Moderate to vigorous-intensity aerobic activity 150 minutes per week if tolerated. Lipid-lowering medications: see documented in medical  record.  3. Obesity, with current BMI 37.03 Molly [name] is currently in the action stage of change. As such, her goal is to continue with weight loss efforts. She has agreed to the Category 1 Plan.   Exercise goals: As is.  Behavioral modification strategies: increasing lean protein intake, no skipping meals, and meal planning and cooking strategies.  Molly [name] has agreed to follow-up with our clinic in 3 weeks. She was informed of the importance of frequent follow-up visits to maximize her success with intensive lifestyle modifications for her multiple health conditions.   Objective:   Blood pressure 134/78, pulse 93, temperature 98 F (36.7 C), height 5\' 3"  (1.6 m), weight 209 lb (94.8 kg), SpO2 98 %. Body mass index is 37.02 kg/m.  General: Cooperative, alert, well developed, in no acute distress. HEENT: Conjunctivae and lids unremarkable. Cardiovascular: Regular rhythm.  Lungs: Normal work of breathing. Neurologic: No focal deficits.   Lab Results  Component Value Date   CREATININE 0.95 01/20/2021   BUN 12 01/20/2021   NA 141 01/20/2021   K 3.3 (L) 02/10/2021   CL 101 01/20/2021   CO2 26 01/20/2021   Lab Results  Component Value Date   ALT 17 01/20/2021   AST 21 01/20/2021   ALKPHOS 58 01/20/2021   BILITOT 0.5 01/20/2021   Lab Results  Component Value Date   HGBA1C 6.3 (H) 01/20/2021   HGBA1C 6.2 (H) 07/10/2020   HGBA1C 5.9 (H) 12/14/2019   HGBA1C 5.9 (H) 06/05/2019   HGBA1C 6.1 (H) 09/29/2018   Lab Results  Component Value Date   INSULIN 17.6 01/20/2021   INSULIN  12.0 07/10/2020   INSULIN 16.0 12/14/2019   INSULIN 22.5 06/05/2019   INSULIN 19.0 09/29/2018   Lab Results  Component Value Date   TSH 1.680 08/31/2017   Lab Results  Component Value Date   CHOL 166 01/20/2021   HDL 64 01/20/2021   LDLCALC 84 01/20/2021   TRIG 99 01/20/2021   CHOLHDL 2.6 01/20/2021   Lab Results  Component Value Date   VD25OH 48.9 01/20/2021   VD25OH 47.9 07/10/2020   VD25OH  50.7 12/14/2019   Lab Results  Component Value Date   WBC 4.4 09/12/2017   HGB 15.0 09/12/2017   HCT 43.7 09/12/2017   MCV 92.6 09/12/2017   PLT 173 09/12/2017   No results found for: IRON, TIBC, FERRITIN  Obesity Behavioral Intervention:   Approximately 15 minutes were spent on the discussion below.  ASK: We discussed the diagnosis of obesity with Molly [name] today and Molly [name] agreed to give [name] permission to discuss obesity behavioral modification therapy today.  ASSESS: Molly [name] has the diagnosis of obesity and her BMI today is 37.1. Molly [name] is in the action stage of change.   ADVISE: Molly [name] was educated on the multiple health risks of obesity as well as the benefit of weight loss to improve her health. She was advised of the need for long term treatment and the importance of lifestyle modifications to improve her current health and to decrease her risk of future health problems.  AGREE: Multiple dietary modification options and treatment options were discussed and Molly [name] agreed to follow the recommendations documented in the above note.  ARRANGE: Molly [name] was educated on the importance of frequent visits to treat obesity as outlined per CMS and USPSTF guidelines and agreed to schedule her next follow up appointment today.  Attestation Statements:   Reviewed by clinician on day of visit: allergies, medications, problem list, medical history, surgical history, family history, social history, and previous encounter notes.   Wilhemena Durie, am acting as transcriptionist for [employer], PA-C.  I have reviewed the above documentation for accuracy and completeness, and I agree with the above. Abby Potash, PA-C

## 2021-05-12 ENCOUNTER — Ambulatory Visit (INDEPENDENT_AMBULATORY_CARE_PROVIDER_SITE_OTHER): Payer: Medicare HMO | Admitting: Physician Assistant

## 2021-05-12 ENCOUNTER — Encounter (INDEPENDENT_AMBULATORY_CARE_PROVIDER_SITE_OTHER): Payer: Self-pay | Admitting: Physician Assistant

## 2021-05-12 ENCOUNTER — Other Ambulatory Visit: Payer: Self-pay

## 2021-05-12 VITALS — BP 122/77 | HR 94 | Temp 98.0°F | Ht 63.0 in | Wt 208.0 lb

## 2021-05-12 DIAGNOSIS — E1169 Type 2 diabetes mellitus with other specified complication: Secondary | ICD-10-CM

## 2021-05-12 DIAGNOSIS — Z6837 Body mass index (BMI) 37.0-37.9, adult: Secondary | ICD-10-CM

## 2021-05-12 DIAGNOSIS — E782 Mixed hyperlipidemia: Secondary | ICD-10-CM

## 2021-05-12 NOTE — Progress Notes (Signed)
Chief Complaint:   OBESITY Molly [name] is here to discuss her progress with her obesity treatment plan along with follow-up of her obesity related diagnoses. Molly [name] is on the Category 1 Plan and states she is following her eating plan approximately 80% of the time. Molly [name] states she is doing yoga 45 minutes 2 times per week.  Today's visit was #: 68 Starting weight: 223 lbs Starting date: 08/31/2017 Today's weight: 208 lbs Today's date: 05/12/2021 Total lbs lost to date: 15 Total lbs lost since last in-office visit: 1  Interim History: Molly [name] reports that she is struggling with getting in meat, as she is not a meat eater. She sometimes will drink a Boost shake if she is not hungry.  Subjective:   1. DM type 2 with diabetic with mixed hyperlipidemia (HCC) Molly [name] is on Metformin and tolerating it well. She denies hypoglycemia. Her last A1c was 6.0.  Assessment/Plan:   1. DM type 2 with diabetic with mixed hyperlipidemia ([place]) Good blood sugar control is important to decrease the likelihood of diabetic complications such as nephropathy, neuropathy, limb loss, blindness, coronary artery disease, and death. Intensive lifestyle modification including diet, exercise and weight loss are the first line of treatment for diabetes. Continue with meds and weight loss.  2. Obesity, with current BMI 36.85  Molly [name] is currently in the action stage of change. As such, her goal is to continue with weight loss efforts. She has agreed to the Category 1 Plan.   Exercise goals:  As is  Behavioral modification strategies: increasing lean protein intake and meal planning and cooking strategies.  Molly [name] has agreed to follow-up with our clinic in 3 weeks. She was informed of the importance of frequent follow-up visits to maximize her success with intensive lifestyle modifications for her multiple health conditions.   Objective:   Blood pressure 122/77, pulse 94, temperature 98 F (36.7 C), height 5\' 3"  (1.6 m), weight  208 lb (94.3 kg), SpO2 99 %. Body mass index is 36.85 kg/m.  General: Cooperative, alert, well developed, in no acute distress. HEENT: Conjunctivae and lids unremarkable. Cardiovascular: Regular rhythm.  Lungs: Normal work of breathing. Neurologic: No focal deficits.   Lab Results  Component Value Date   CREATININE 0.95 01/20/2021   BUN 12 01/20/2021   NA 141 01/20/2021   K 3.3 (L) 02/10/2021   CL 101 01/20/2021   CO2 26 01/20/2021   Lab Results  Component Value Date   ALT 17 01/20/2021   AST 21 01/20/2021   ALKPHOS 58 01/20/2021   BILITOT 0.5 01/20/2021   Lab Results  Component Value Date   HGBA1C 6.3 (H) 01/20/2021   HGBA1C 6.2 (H) 07/10/2020   HGBA1C 5.9 (H) 12/14/2019   HGBA1C 5.9 (H) 06/05/2019   HGBA1C 6.1 (H) 09/29/2018   Lab Results  Component Value Date   INSULIN 17.6 01/20/2021   INSULIN 12.0 07/10/2020   INSULIN 16.0 12/14/2019   INSULIN 22.5 06/05/2019   INSULIN 19.0 09/29/2018   Lab Results  Component Value Date   TSH 1.680 08/31/2017   Lab Results  Component Value Date   CHOL 166 01/20/2021   HDL 64 01/20/2021   LDLCALC 84 01/20/2021   TRIG 99 01/20/2021   CHOLHDL 2.6 01/20/2021   Lab Results  Component Value Date   VD25OH 48.9 01/20/2021   VD25OH 47.9 07/10/2020   VD25OH 50.7 12/14/2019   Lab Results  Component Value Date   WBC 4.4 09/12/2017   HGB 15.0 09/12/2017  HCT 43.7 09/12/2017   MCV 92.6 09/12/2017   PLT 173 09/12/2017   No results found for: IRON, TIBC, FERRITIN  Obesity Behavioral Intervention:   Approximately 15 minutes were spent on the discussion below.  ASK: We discussed the diagnosis of obesity with Molly [name] today and Molly [name] agreed to give [name] permission to discuss obesity behavioral modification therapy today.  ASSESS: Molly [name] has the diagnosis of obesity and her BMI today is 36.9. Molly [name] is in the action stage of change.   ADVISE: Molly [name] was educated on the multiple health risks of obesity as well as the benefit of  weight loss to improve her health. She was advised of the need for long term treatment and the importance of lifestyle modifications to improve her current health and to decrease her risk of future health problems.  AGREE: Multiple dietary modification options and treatment options were discussed and Molly [name] agreed to follow the recommendations documented in the above note.  ARRANGE: Conda was educated on the importance of frequent visits to treat obesity as outlined per CMS and USPSTF guidelines and agreed to schedule her next follow up appointment today.  Attestation Statements:   Reviewed by clinician on day of visit: allergies, medications, problem list, medical history, surgical history, family history, social history, and previous encounter notes.  Coral Ceo, CMA, am acting as transcriptionist for [employer], PA-C.  I have reviewed the above documentation for accuracy and completeness, and I agree with the above. Abby Potash, PA-C

## 2021-06-02 ENCOUNTER — Ambulatory Visit (INDEPENDENT_AMBULATORY_CARE_PROVIDER_SITE_OTHER): Payer: Medicare HMO | Admitting: Physician Assistant

## 2021-06-16 ENCOUNTER — Ambulatory Visit (INDEPENDENT_AMBULATORY_CARE_PROVIDER_SITE_OTHER): Payer: Medicare HMO | Admitting: Family Medicine

## 2021-06-16 ENCOUNTER — Encounter (INDEPENDENT_AMBULATORY_CARE_PROVIDER_SITE_OTHER): Payer: Self-pay | Admitting: Family Medicine

## 2021-06-16 ENCOUNTER — Other Ambulatory Visit: Payer: Self-pay

## 2021-06-16 VITALS — BP 145/88 | HR 86 | Temp 98.1°F | Ht 63.0 in | Wt 207.0 lb

## 2021-06-16 DIAGNOSIS — E1169 Type 2 diabetes mellitus with other specified complication: Secondary | ICD-10-CM

## 2021-06-16 DIAGNOSIS — Z6837 Body mass index (BMI) 37.0-37.9, adult: Secondary | ICD-10-CM

## 2021-06-16 DIAGNOSIS — E559 Vitamin D deficiency, unspecified: Secondary | ICD-10-CM | POA: Diagnosis not present

## 2021-06-16 DIAGNOSIS — I152 Hypertension secondary to endocrine disorders: Secondary | ICD-10-CM | POA: Diagnosis not present

## 2021-06-16 DIAGNOSIS — E1159 Type 2 diabetes mellitus with other circulatory complications: Secondary | ICD-10-CM

## 2021-06-16 DIAGNOSIS — K5900 Constipation, unspecified: Secondary | ICD-10-CM

## 2021-06-16 MED ORDER — POLYETHYLENE GLYCOL 3350 17 G PO PACK: 17.0000 g | PACK | Freq: Two times a day (BID) | ORAL | 0 refills | Status: AC

## 2021-06-16 NOTE — Progress Notes (Signed)
Chief Complaint:   OBESITY Molly [name] is here to discuss her progress with her obesity treatment plan along with follow-up of her obesity related diagnoses. Molly [name] is on the Category 1 Plan and states she is following her eating plan approximately 80% of the time. Molly [name] states she is doing Yoga 45 minutes 2-3 times per week.  Today's visit was #: 30 Starting weight: 223 lbs Starting date: 08/31/2017 Today's weight: 207 lbs Today's date: 06/16/2021 Total lbs lost to date: 16 Total lbs lost since last in-office visit: 1  Interim History: Molly [name] reports it is difficult to get all her meat/protein in. She had a couple of celebratory gatherings lately but still managed to lose weight. She normally sees Molly [name]. This is her first visit with me. Pt is going to the beach with family for Thanksgiving weekend.  Subjective:   1. Type 2 diabetes mellitus with other specified complication, without long-term current use of insulin (HCC) Molly [name]'s last A1c was 6.0. Her fasting blood sugar runs 98-100's. Blood sugar is stable at home with no lows or highs.  2. Hypertension associated with type 2 diabetes mellitus ([place]) At goal for age but with diabetes, preference is less than 140/90. Molly [name] forgot to take her BP med this morning. Her BP at home runs 120's/70-80's.  3. Vitamin D deficiency Molly [name] is on OTC Vit D 2,000 IU QD.  4. Constipation, unspecified constipation type Molly [name] has been constipated lately. She is taking Metamucil and is not sure how much water she drinks.  Assessment/Plan:   Orders Placed This Encounter  Procedures   Insulin, random   Hemoglobin A1c   VITAMIN D 25 Hydroxy (Vit-D Deficiency, Fractures)    There are no discontinued medications.   Meds ordered this encounter  Medications   polyethylene glycol (MIRALAX / GLYCOLAX) 17 g packet    Sig: Take 17 g by mouth 2 (two) times daily. Until stooling regularly    Dispense:  60 packet    Refill:  0     1. Type 2 diabetes mellitus  with other specified complication, without long-term current use of insulin (HCC) Good blood sugar control is important to decrease the likelihood of diabetic complications such as nephropathy, neuropathy, limb loss, blindness, coronary artery disease, and death. Intensive lifestyle modification including diet, exercise and weight loss are the first line of treatment for diabetes. Check labs in the near future. Continue Metformin.  - Insulin, random - Hemoglobin A1c  2. Hypertension associated with type 2 diabetes mellitus ([place]) Molly [name] is working on healthy weight loss and exercise to improve blood pressure control. We will watch for signs of hypotension as she continues her lifestyle modifications.continue home blood pressure monitoring and decrease salt intake.  3. Vitamin D deficiency At goal when last checked 3-4 months ago. Continue supplement and recheck labs in the near future.  - VITAMIN D 25 Hydroxy (Vit-D Deficiency, Fractures)  4. Constipation, unspecified constipation type Start Miralax. Counseling done on constipation treatment and prevention.  Start- polyethylene glycol (MIRALAX / GLYCOLAX) 17 g packet; Take 17 g by mouth 2 (two) times daily. Until stooling regularly  Dispense: 60 packet; Refill: 0  5. Obesity with current BMI of 36.8  Molly [name] is currently in the action stage of change. As such, her goal is to continue with weight loss efforts. She has agreed to the Category 1 Plan.   Exercise goals: For substantial health benefits, adults should do at least 150 minutes (2 hours and 30 minutes) a week of  moderate-intensity, or 75 minutes (1 hour and 15 minutes) a week of vigorous-intensity aerobic physical activity, or an equivalent combination of moderate- and vigorous-intensity aerobic activity. Aerobic activity should be performed in episodes of at least 10 minutes, and preferably, it should be spread throughout the week.  Behavioral modification strategies: increasing lean  protein intake, decreasing simple carbohydrates, and holiday eating strategies .  Molly [name] has agreed to follow-up with our clinic in 3-4 weeks, with fasting labs. She was informed of the importance of frequent follow-up visits to maximize her success with intensive lifestyle modifications for her multiple health conditions.   Objective:   Blood pressure (!) 145/88, pulse 86, temperature 98.1 F (36.7 C), height 5\' 3"  (1.6 m), weight 207 lb (93.9 kg), SpO2 98 %. Body mass index is 36.67 kg/m.  General: Cooperative, alert, well developed, in no acute distress. HEENT: Conjunctivae and lids unremarkable. Cardiovascular: Regular rhythm.  Lungs: Normal work of breathing. Neurologic: No focal deficits.   Lab Results  Component Value Date   CREATININE 0.95 01/20/2021   BUN 12 01/20/2021   NA 141 01/20/2021   K 3.3 (L) 02/10/2021   CL 101 01/20/2021   CO2 26 01/20/2021   Lab Results  Component Value Date   ALT 17 01/20/2021   AST 21 01/20/2021   ALKPHOS 58 01/20/2021   BILITOT 0.5 01/20/2021   Lab Results  Component Value Date   HGBA1C 6.3 (H) 01/20/2021   HGBA1C 6.2 (H) 07/10/2020   HGBA1C 5.9 (H) 12/14/2019   HGBA1C 5.9 (H) 06/05/2019   HGBA1C 6.1 (H) 09/29/2018   Lab Results  Component Value Date   INSULIN 17.6 01/20/2021   INSULIN 12.0 07/10/2020   INSULIN 16.0 12/14/2019   INSULIN 22.5 06/05/2019   INSULIN 19.0 09/29/2018   Lab Results  Component Value Date   TSH 1.680 08/31/2017   Lab Results  Component Value Date   CHOL 166 01/20/2021   HDL 64 01/20/2021   LDLCALC 84 01/20/2021   TRIG 99 01/20/2021   CHOLHDL 2.6 01/20/2021   Lab Results  Component Value Date   VD25OH 48.9 01/20/2021   VD25OH 47.9 07/10/2020   VD25OH 50.7 12/14/2019   Lab Results  Component Value Date   WBC 4.4 09/12/2017   HGB 15.0 09/12/2017   HCT 43.7 09/12/2017   MCV 92.6 09/12/2017   PLT 173 09/12/2017   No results found for: IRON, TIBC, FERRITIN  Obesity Behavioral  Intervention:   Approximately 15 minutes were spent on the discussion below.  ASK: We discussed the diagnosis of obesity with Molly [name] today and Molly [name] agreed to give [name] permission to discuss obesity behavioral modification therapy today.  ASSESS: Molly [name] has the diagnosis of obesity and her BMI today is 36.8. Emelda is in the action stage of change.   ADVISE: Molly [name] was educated on the multiple health risks of obesity as well as the benefit of weight loss to improve her health. She was advised of the need for long term treatment and the importance of lifestyle modifications to improve her current health and to decrease her risk of future health problems.  AGREE: Multiple dietary modification options and treatment options were discussed and Molly [name] agreed to follow the recommendations documented in the above note.  ARRANGE: Molly [name] was educated on the importance of frequent visits to treat obesity as outlined per CMS and USPSTF guidelines and agreed to schedule her next follow up appointment today.  Attestation Statements:   Reviewed by clinician on day of visit: allergies, medications,  problem list, medical history, surgical history, family history, social history, and previous encounter notes.  Coral Ceo, CMA, am acting as transcriptionist for [employer], DO.  I have reviewed the above documentation for accuracy and completeness, and I agree with the above. [name], D.O.  The [place] was signed into law in 2016 which includes the topic of electronic health records.  This provides immediate access to information in MyChart.  This includes consultation notes, operative notes, office notes, lab results and pathology reports.  If you have any questions about what you read please let us know at your next visit so we can discuss your concerns and take corrective action if need be.  We are right here with you.

## 2021-06-17 ENCOUNTER — Ambulatory Visit (INDEPENDENT_AMBULATORY_CARE_PROVIDER_SITE_OTHER): Payer: Medicare HMO | Admitting: Physician Assistant

## 2021-07-14 ENCOUNTER — Ambulatory Visit (INDEPENDENT_AMBULATORY_CARE_PROVIDER_SITE_OTHER): Payer: Medicare HMO | Admitting: Physician Assistant

## 2021-07-14 ENCOUNTER — Other Ambulatory Visit: Payer: Self-pay

## 2021-07-14 ENCOUNTER — Encounter (INDEPENDENT_AMBULATORY_CARE_PROVIDER_SITE_OTHER): Payer: Self-pay | Admitting: Physician Assistant

## 2021-07-14 VITALS — BP 152/81 | HR 88 | Temp 98.1°F | Ht 63.0 in | Wt 209.0 lb

## 2021-07-14 DIAGNOSIS — E1169 Type 2 diabetes mellitus with other specified complication: Secondary | ICD-10-CM | POA: Diagnosis not present

## 2021-07-14 DIAGNOSIS — E559 Vitamin D deficiency, unspecified: Secondary | ICD-10-CM | POA: Diagnosis not present

## 2021-07-14 DIAGNOSIS — E785 Hyperlipidemia, unspecified: Secondary | ICD-10-CM | POA: Diagnosis not present

## 2021-07-14 DIAGNOSIS — Z6839 Body mass index (BMI) 39.0-39.9, adult: Secondary | ICD-10-CM

## 2021-07-14 NOTE — Progress Notes (Signed)
Chief Complaint:   OBESITY Molly [name] is here to discuss her progress with her obesity treatment plan along with follow-up of her obesity related diagnoses. Molly [name] is on the Category 1 Plan and states she is following her eating plan approximately 80% of the time. Molly [name] states she is doing yoga for 45 minutes 1 times per week.  Today's visit was #: 64 Starting weight: 223 lbs Starting date: 08/31/2017 Today's weight: 209 lbs Today's date:07/14/2021 Total lbs lost to date: 14 lbs Total lbs lost since last in-office visit: 0  Interim History: Molly [name] reports that she has been to multiple celebrations and over indulged at times. She sometimes skips a meal or does some boredom eating.  Subjective:   1. Type 2 diabetes mellitus with other specified complication, without long-term current use of insulin (HCC) Molly [name] is currently on Metformin. She has no noted side effects. Her last A1C was 6.3.  2. Hyperlipidemia associated with type 2 diabetes mellitus ([place]) Molly [name] is on Crestor currently and she is tolerating it well.  3. Vitamin D deficiency Molly [name] is currently on OTC Vitamin D 5000 units daily.  Assessment/Plan:   1. Type 2 diabetes mellitus with other specified complication, without long-term current use of insulin ([place]) We will check labs today and Amily will continue with her medications and the plan. Good blood sugar control is important to decrease the likelihood of diabetic complications such as nephropathy, neuropathy, limb loss, blindness, coronary artery disease, and death. Intensive lifestyle modification including diet, exercise and weight loss are the first line of treatment for diabetes.   - Comprehensive metabolic panel - Hemoglobin A1c - Insulin, random  2. Hyperlipidemia associated with type 2 diabetes mellitus ([place]) We will check labs today. Molly [name] will continue with medications and the plan. Cardiovascular risk and specific lipid/LDL goals reviewed.  We discussed several  lifestyle modifications today and Molly [name] will continue to work on diet, exercise and weight loss efforts. Orders and follow up as documented in patient record.   Counseling Intensive lifestyle modifications are the first line treatment for this issue. Dietary changes: Increase soluble fiber. Decrease simple carbohydrates. Exercise changes: Moderate to vigorous-intensity aerobic activity 150 minutes per week if tolerated. Lipid-lowering medications: see documented in medical record. - Lipid panel  3. Vitamin D deficiency Low Vitamin D level contributes to fatigue and are associated with obesity, breast, and colon cancer. We will check labs today. Molly [name] will continue to take OTC Vitamin D 5,000 IU daily and she will follow-up for routine testing of Vitamin D, at least 2-3 times per year to avoid over-replacement.  - VITAMIN D 25 Hydroxy (Vit-D Deficiency, Fractures)  4. Obesity with current BMI of 37.03 Molly [name] is currently in the action stage of change. As such, her goal is to continue with weight loss efforts. She has agreed to the Category 1 Plan.   Exercise goals:  As is.  Behavioral modification strategies: increasing lean protein intake and no skipping meals.  Molly [name] has agreed to follow-up with our clinic in 3 weeks. She was informed of the importance of frequent follow-up visits to maximize her success with intensive lifestyle modifications for her multiple health conditions.   Molly [name] was informed we would discuss her lab results at her next visit unless there is a critical issue that needs to be addressed sooner. Molly [name] agreed to keep her next visit at the agreed upon time to discuss these results.  Objective:   Blood pressure (!) 152/81, pulse 88, temperature 98.1 F (36.7  C), height 5\' 3"  (1.6 m), weight 209 lb (94.8 kg), SpO2 98 %. Body mass index is 37.02 kg/m.  General: Cooperative, alert, well developed, in no acute distress. HEENT: Conjunctivae and lids  unremarkable. Cardiovascular: Regular rhythm.  Lungs: Normal work of breathing. Neurologic: No focal deficits.   Lab Results  Component Value Date   CREATININE 0.95 01/20/2021   BUN 12 01/20/2021   NA 141 01/20/2021   K 3.3 (L) 02/10/2021   CL 101 01/20/2021   CO2 26 01/20/2021   Lab Results  Component Value Date   ALT 17 01/20/2021   AST 21 01/20/2021   ALKPHOS 58 01/20/2021   BILITOT 0.5 01/20/2021   Lab Results  Component Value Date   HGBA1C 6.3 (H) 01/20/2021   HGBA1C 6.2 (H) 07/10/2020   HGBA1C 5.9 (H) 12/14/2019   HGBA1C 5.9 (H) 06/05/2019   HGBA1C 6.1 (H) 09/29/2018   Lab Results  Component Value Date   INSULIN 17.6 01/20/2021   INSULIN 12.0 07/10/2020   INSULIN 16.0 12/14/2019   INSULIN 22.5 06/05/2019   INSULIN 19.0 09/29/2018   Lab Results  Component Value Date   TSH 1.680 08/31/2017   Lab Results  Component Value Date   CHOL 166 01/20/2021   HDL 64 01/20/2021   LDLCALC 84 01/20/2021   TRIG 99 01/20/2021   CHOLHDL 2.6 01/20/2021   Lab Results  Component Value Date   VD25OH 48.9 01/20/2021   VD25OH 47.9 07/10/2020   VD25OH 50.7 12/14/2019   Lab Results  Component Value Date   WBC 4.4 09/12/2017   HGB 15.0 09/12/2017   HCT 43.7 09/12/2017   MCV 92.6 09/12/2017   PLT 173 09/12/2017   No results found for: IRON, TIBC, FERRITIN  Obesity Behavioral Intervention:   Approximately 15 minutes were spent on the discussion below.  ASK: We discussed the diagnosis of obesity with Molly [name] today and Molly [name] agreed to give [name] permission to discuss obesity behavioral modification therapy today.  ASSESS: Molly [name] has the diagnosis of obesity and her BMI today is 37.1. Molly [name] is in the action stage of change.   ADVISE: Molly [name] was educated on the multiple health risks of obesity as well as the benefit of weight loss to improve her health. She was advised of the need for long term treatment and the importance of lifestyle modifications to improve her current health  and to decrease her risk of future health problems.  AGREE: Multiple dietary modification options and treatment options were discussed and Molly [name] agreed to follow the recommendations documented in the above note.  ARRANGE: Molly [name] was educated on the importance of frequent visits to treat obesity as outlined per CMS and USPSTF guidelines and agreed to schedule her next follow up appointment today.  Attestation Statements:   Reviewed by clinician on day of visit: allergies, medications, problem list, medical history, surgical history, family history, social history, and previous encounter notes.   I, Tonye Pearson, am acting as Location manager for [employer], PA-C.  I have reviewed the above documentation for accuracy and completeness, and I agree with the above. Abby Potash, PA-C

## 2021-07-15 LAB — LIPID PANEL
Chol/HDL Ratio: 2.4 ratio (ref 0.0–4.4)
Cholesterol, Total: 149 mg/dL (ref 100–199)
HDL: 63 mg/dL (ref >39)
LDL Chol Calc (NIH): 71 mg/dL (ref 0–99)
Triglycerides: 79 mg/dL (ref 0–149)
VLDL Cholesterol Cal: 15 mg/dL (ref 5–40)

## 2021-07-15 LAB — HEMOGLOBIN A1C
Est. average glucose Bld gHb Est-mCnc: 128 mg/dL
Hgb A1c MFr Bld: 6.1 % — ABNORMAL HIGH (ref 4.8–5.6)

## 2021-07-15 LAB — VITAMIN D 25 HYDROXY (VIT D DEFICIENCY, FRACTURES): Vit D, 25-Hydroxy: 50.3 ng/mL (ref 30.0–100.0)

## 2021-07-15 LAB — COMPREHENSIVE METABOLIC PANEL
ALT: 16 IU/L (ref 0–32)
AST: 18 IU/L (ref 0–40)
Albumin/Globulin Ratio: 1.9 (ref 1.2–2.2)
Albumin: 4.9 g/dL — ABNORMAL HIGH (ref 3.7–4.7)
Alkaline Phosphatase: 55 IU/L (ref 44–121)
BUN/Creatinine Ratio: 16 (ref 12–28)
BUN: 16 mg/dL (ref 8–27)
Bilirubin Total: 0.4 mg/dL (ref 0.0–1.2)
CO2: 26 mmol/L (ref 20–29)
Calcium: 10.8 mg/dL — ABNORMAL HIGH (ref 8.7–10.3)
Chloride: 99 mmol/L (ref 96–106)
Creatinine, Ser: 1.03 mg/dL — ABNORMAL HIGH (ref 0.57–1.00)
Globulin, Total: 2.6 g/dL (ref 1.5–4.5)
Glucose: 115 mg/dL — ABNORMAL HIGH (ref 70–99)
Potassium: 3.8 mmol/L (ref 3.5–5.2)
Sodium: 140 mmol/L (ref 134–144)
Total Protein: 7.5 g/dL (ref 6.0–8.5)
eGFR: 57 mL/min/{1.73_m2} — ABNORMAL LOW (ref >59)

## 2021-07-15 LAB — INSULIN, RANDOM: INSULIN: 18.4 u[IU]/mL (ref 2.6–24.9)

## 2021-08-05 ENCOUNTER — Ambulatory Visit (INDEPENDENT_AMBULATORY_CARE_PROVIDER_SITE_OTHER): Payer: Medicare HMO | Admitting: Physician Assistant

## 2021-08-21 ENCOUNTER — Encounter (INDEPENDENT_AMBULATORY_CARE_PROVIDER_SITE_OTHER): Payer: Self-pay | Admitting: Physician Assistant

## 2021-08-21 ENCOUNTER — Ambulatory Visit (INDEPENDENT_AMBULATORY_CARE_PROVIDER_SITE_OTHER): Payer: Medicare HMO | Admitting: Physician Assistant

## 2021-08-21 ENCOUNTER — Other Ambulatory Visit: Payer: Self-pay

## 2021-08-21 VITALS — BP 133/89 | HR 100 | Temp 98.3°F | Ht 63.0 in | Wt 204.0 lb

## 2021-08-21 DIAGNOSIS — I152 Hypertension secondary to endocrine disorders: Secondary | ICD-10-CM

## 2021-08-21 DIAGNOSIS — E1159 Type 2 diabetes mellitus with other circulatory complications: Secondary | ICD-10-CM

## 2021-08-21 DIAGNOSIS — Z6837 Body mass index (BMI) 37.0-37.9, adult: Secondary | ICD-10-CM

## 2021-08-21 DIAGNOSIS — E669 Obesity, unspecified: Secondary | ICD-10-CM | POA: Diagnosis not present

## 2021-08-21 NOTE — Progress Notes (Signed)
Chief Complaint:   OBESITY Loanne is here to discuss her progress with her obesity treatment plan along with follow-up of her obesity related diagnoses. Laray is on the Category 1 Plan and states she is following her eating plan approximately 80% of the time. Phala states she is doing yoga for 45 minutes 2-3 times per week.  Today's visit was #: 14 Starting weight: 223 lbs Starting date: 08/31/2017 Today's weight: 204 lbs Today's date: 08/21/2021 Total lbs lost to date: 19 lbs Total lbs lost since last in-office visit: 5 lbs  Interim History: Paulyne states that she really "buckled down" over the last few weeks. She has been drinking more water and less sugary drinks. She is also eating more protein.  Subjective:   1. Hypertension associated with type 2 diabetes mellitus ([place]) Shenna's blood pressure is managed by her primary care provider. She check her blood pressure at home and it has been running in the range of 120's/70's overall. She has no history of headaches. She denies chest pain.  Assessment/Plan:   1. Hypertension associated with type 2 diabetes mellitus ([place]) Starr will continue with medications and the plan. She is working on healthy weight loss and exercise to improve blood pressure control. We will watch for signs of hypotension as she continues her lifestyle modifications.  2. Obesity with current BMI of 37.03 Carmel is currently in the action stage of change. As such, her goal is to continue with weight loss efforts. She has agreed to the Category 1 Plan.   Exercise goals:  As is.  Behavioral modification strategies: meal planning and cooking strategies.  Jeslin has agreed to follow-up with our clinic in 3 weeks. She was informed of the importance of frequent follow-up visits to maximize her success with intensive lifestyle modifications for her multiple health conditions.   Objective:   Blood pressure 133/89, pulse 100, temperature 98.3 F (36.8 C), height 5\' 3"  (1.6  m), weight 204 lb (92.5 kg), SpO2 99 %. Body mass index is 36.14 kg/m.  General: Cooperative, alert, well developed, in no acute distress. HEENT: Conjunctivae and lids unremarkable. Cardiovascular: Regular rhythm.  Lungs: Normal work of breathing. Neurologic: No focal deficits.   Lab Results  Component Value Date   CREATININE 1.03 (H) 07/14/2021   BUN 16 07/14/2021   NA 140 07/14/2021   K 3.8 07/14/2021   CL 99 07/14/2021   CO2 26 07/14/2021   Lab Results  Component Value Date   ALT 16 07/14/2021   AST 18 07/14/2021   ALKPHOS 55 07/14/2021   BILITOT 0.4 07/14/2021   Lab Results  Component Value Date   HGBA1C 6.1 (H) 07/14/2021   HGBA1C 6.3 (H) 01/20/2021   HGBA1C 6.2 (H) 07/10/2020   HGBA1C 5.9 (H) 12/14/2019   HGBA1C 5.9 (H) 06/05/2019   Lab Results  Component Value Date   INSULIN 18.4 07/14/2021   INSULIN 17.6 01/20/2021   INSULIN 12.0 07/10/2020   INSULIN 16.0 12/14/2019   INSULIN 22.5 06/05/2019   Lab Results  Component Value Date   TSH 1.680 08/31/2017   Lab Results  Component Value Date   CHOL 149 07/14/2021   HDL 63 07/14/2021   LDLCALC 71 07/14/2021   TRIG 79 07/14/2021   CHOLHDL 2.4 07/14/2021   Lab Results  Component Value Date   VD25OH 50.3 07/14/2021   VD25OH 48.9 01/20/2021   VD25OH 47.9 07/10/2020   Lab Results  Component Value Date   WBC 4.4 09/12/2017   HGB  15.0 09/12/2017   HCT 43.7 09/12/2017   MCV 92.6 09/12/2017   PLT 173 09/12/2017   No results found for: IRON, TIBC, FERRITIN  Obesity Behavioral Intervention:   Approximately 15 minutes were spent on the discussion below.  ASK: We discussed the diagnosis of obesity with [name] today and Mairany agreed to give [name] permission to discuss obesity behavioral modification therapy today.  ASSESS: Reiko has the diagnosis of obesity and her BMI today is 36.2. Sweet is in the action stage of change.   ADVISE: Yulia was educated on the multiple health risks of obesity as well as the  benefit of weight loss to improve her health. She was advised of the need for long term treatment and the importance of lifestyle modifications to improve her current health and to decrease her risk of future health problems.  AGREE: Multiple dietary modification options and treatment options were discussed and Doria agreed to follow the recommendations documented in the above note.  ARRANGE: Clothilde was educated on the importance of frequent visits to treat obesity as outlined per CMS and USPSTF guidelines and agreed to schedule her next follow up appointment today.  Attestation Statements:   Reviewed by clinician on day of visit: allergies, medications, problem list, medical history, surgical history, family history, social history, and previous encounter notes.  I, Tonye Pearson, am acting as Location manager for [employer], PA-C.  I have reviewed the above documentation for accuracy and completeness, and I agree with the above. Abby Potash, PA-C

## 2021-09-10 ENCOUNTER — Encounter (INDEPENDENT_AMBULATORY_CARE_PROVIDER_SITE_OTHER): Payer: Self-pay

## 2021-09-11 ENCOUNTER — Ambulatory Visit (INDEPENDENT_AMBULATORY_CARE_PROVIDER_SITE_OTHER): Payer: Medicare HMO | Admitting: Physician Assistant

## 2021-09-12 DIAGNOSIS — I152 Hypertension secondary to endocrine disorders: Secondary | ICD-10-CM | POA: Diagnosis not present

## 2021-09-12 DIAGNOSIS — E1159 Type 2 diabetes mellitus with other circulatory complications: Secondary | ICD-10-CM | POA: Diagnosis not present

## 2021-09-12 DIAGNOSIS — E559 Vitamin D deficiency, unspecified: Secondary | ICD-10-CM | POA: Diagnosis not present

## 2021-09-12 DIAGNOSIS — E118 Type 2 diabetes mellitus with unspecified complications: Secondary | ICD-10-CM | POA: Diagnosis not present

## 2021-09-12 DIAGNOSIS — E782 Mixed hyperlipidemia: Secondary | ICD-10-CM | POA: Diagnosis not present

## 2021-09-12 DIAGNOSIS — D696 Thrombocytopenia, unspecified: Secondary | ICD-10-CM | POA: Diagnosis not present

## 2021-09-12 DIAGNOSIS — Z1211 Encounter for screening for malignant neoplasm of colon: Secondary | ICD-10-CM | POA: Diagnosis not present

## 2021-10-06 ENCOUNTER — Ambulatory Visit (INDEPENDENT_AMBULATORY_CARE_PROVIDER_SITE_OTHER): Payer: Medicare HMO | Admitting: Physician Assistant

## 2021-10-06 ENCOUNTER — Other Ambulatory Visit: Payer: Self-pay

## 2021-10-06 ENCOUNTER — Encounter (INDEPENDENT_AMBULATORY_CARE_PROVIDER_SITE_OTHER): Payer: Self-pay | Admitting: Physician Assistant

## 2021-10-06 VITALS — BP 152/100 | HR 94 | Temp 98.2°F | Ht 63.0 in | Wt 206.0 lb

## 2021-10-06 DIAGNOSIS — I152 Hypertension secondary to endocrine disorders: Secondary | ICD-10-CM

## 2021-10-06 DIAGNOSIS — E669 Obesity, unspecified: Secondary | ICD-10-CM | POA: Diagnosis not present

## 2021-10-06 DIAGNOSIS — Z6837 Body mass index (BMI) 37.0-37.9, adult: Secondary | ICD-10-CM | POA: Diagnosis not present

## 2021-10-06 DIAGNOSIS — E1159 Type 2 diabetes mellitus with other circulatory complications: Secondary | ICD-10-CM | POA: Diagnosis not present

## 2021-10-06 DIAGNOSIS — E1169 Type 2 diabetes mellitus with other specified complication: Secondary | ICD-10-CM

## 2021-10-06 DIAGNOSIS — Z7984 Long term (current) use of oral hypoglycemic drugs: Secondary | ICD-10-CM | POA: Diagnosis not present

## 2021-10-07 NOTE — Progress Notes (Signed)
? ? ? ?Chief Complaint:  ? ?OBESITY ?Molly [name] is here to discuss her progress with her obesity treatment plan along with follow-up of her obesity related diagnoses. Molly [name] is on the Category 1 Plan and states she is following her eating plan approximately 70% of the time. Molly [name] states she had an URI. ? ?Today's visit was #: 61 ?Starting weight: 223 lbs ?Starting date: 08/31/2017 ?Today's weight: 206 lbs ?Today's date: 10/06/2021 ?Total lbs lost to date: 17 lbs ?Total lbs lost since last in-office visit: 0 ? ?Interim History: For the last 3 weeks Molly [name] has had issues with allergies and possible URI. She has been drinking regular ginger ale and Pedialyte. She was not eating as much.  ? ?Subjective:  ? ?1. Hypertension associated with type 2 diabetes mellitus (Molly [name]) ?Molly [name] is taking her medications as prescribed. She has not missed doses. She denies headache or chest pains. She took cough syrup that had a decongestant in it this morning. She is checking her blood pressure at home and normally run in the ranges of 120's/80's.  ? ?2. Type 2 diabetes mellitus with other specified complication, without long-term current use of insulin (Molly [name]) ?Molly [name]'s most recent A1C was 6.2. She is on Metformin and tolerating Metformin well.  ? ?Assessment/Plan:  ? ?1. Hypertension associated with type 2 diabetes mellitus (Molly [name]) ?Molly [name] will only take cough medication that are safe for people with hypertension. Those were reviewed today. She is working on healthy weight loss and exercise to improve blood pressure control. We will watch for signs of hypotension as she continues her lifestyle modifications. ? ?2. Type 2 diabetes mellitus with other specified complication, without long-term current use of insulin (Molly [name]) ?Molly [name] will continue Metformin and her A1C is monitored closely by her primary care provider. Good blood sugar control is important to decrease the likelihood of diabetic complications such as nephropathy, neuropathy, limb loss, blindness,  coronary artery disease, and death. Intensive lifestyle modification including diet, exercise and weight loss are the first line of treatment for diabetes.  ? ?3. Obesity with current BMI of 37.03 ?Molly [name] is currently in the action stage of change. As such, her goal is to continue with weight loss efforts. She has agreed to the Category 1 Plan.  ? ?Exercise goals: No exercise has been prescribed at this time. ? ?Behavioral modification strategies: increasing lean protein intake and decreasing simple carbohydrates. ? ?Molly [name] has agreed to follow-up with our clinic in 3 weeks. She was informed of the importance of frequent follow-up visits to maximize her success with intensive lifestyle modifications for her multiple health conditions.  ? ?Objective:  ? ?Blood pressure (!) 152/100, pulse 94, temperature 98.2 ?F (36.8 ?C), height '5\' 3"'$  (1.6 m), weight 206 lb (93.4 kg), SpO2 99 %. ?Body mass index is 36.49 kg/m?. ? ?General: Cooperative, alert, well developed, in no acute distress. ?HEENT: Conjunctivae and lids unremarkable. ?Cardiovascular: Regular rhythm.  ?Lungs: Normal work of breathing. ?Neurologic: No focal deficits.  ? ?Lab Results  ?Component Value Date  ? CREATININE 1.03 (H) 07/14/2021  ? BUN 16 07/14/2021  ? NA 140 07/14/2021  ? K 3.8 07/14/2021  ? CL 99 07/14/2021  ? CO2 26 07/14/2021  ? ?Lab Results  ?Component Value Date  ? ALT 16 07/14/2021  ? AST 18 07/14/2021  ? ALKPHOS 55 07/14/2021  ? BILITOT 0.4 07/14/2021  ? ?Lab Results  ?Component Value Date  ? HGBA1C 6.1 (H) 07/14/2021  ? HGBA1C 6.3 (H) 01/20/2021  ? HGBA1C 6.2 (H) 07/10/2020  ?  HGBA1C 5.9 (H) 12/14/2019  ? HGBA1C 5.9 (H) 06/05/2019  ? ?Lab Results  ?Component Value Date  ? INSULIN 18.4 07/14/2021  ? INSULIN 17.6 01/20/2021  ? INSULIN 12.0 07/10/2020  ? INSULIN 16.0 12/14/2019  ? INSULIN 22.5 06/05/2019  ? ?Lab Results  ?Component Value Date  ? TSH 1.680 08/31/2017  ? ?Lab Results  ?Component Value Date  ? CHOL 149 07/14/2021  ? HDL 63 07/14/2021  ?  [place] 71 07/14/2021  ? TRIG 79 07/14/2021  ? CHOLHDL 2.4 07/14/2021  ? ?Lab Results  ?Component Value Date  ? VD25OH 50.3 07/14/2021  ? VD25OH 48.9 01/20/2021  ? VD25OH 47.9 07/10/2020  ? ?Lab Results  ?Component Value Date  ? WBC 4.4 09/12/2017  ? HGB 15.0 09/12/2017  ? HCT 43.7 09/12/2017  ? MCV 92.6 09/12/2017  ? PLT 173 09/12/2017  ? ?No results found for: IRON, TIBC, FERRITIN ? ?Obesity Behavioral Intervention:  ? ?Approximately 15 minutes were spent on the discussion below. ? ?ASK: ?We discussed the diagnosis of obesity with Molly [name] today and Molly [name] agreed to give [name] permission to discuss obesity behavioral modification therapy today. ? ?ASSESS: ?Molly [name] has the diagnosis of obesity and her BMI today is 36.6. Molly [name] is in the action stage of change.  ? ?ADVISE: ?Molly [name] was educated on the multiple health risks of obesity as well as the benefit of weight loss to improve her health. She was advised of the need for long term treatment and the importance of lifestyle modifications to improve her current health and to decrease her risk of future health problems. ? ?AGREE: ?Multiple dietary modification options and treatment options were discussed and Molly [name] agreed to follow the recommendations documented in the above note. ? ?ARRANGE: ?Molly [name] was educated on the importance of frequent visits to treat obesity as outlined per CMS and USPSTF guidelines and agreed to schedule her next follow up appointment today. ? ?Attestation Statements:  ? ?Reviewed by clinician on day of visit: allergies, medications, problem list, medical history, surgical history, family history, social history, and previous encounter notes. ? ?I, Tonye Pearson, am acting as Location manager for [employer], PA-C. ? ?I have reviewed the above documentation for accuracy and completeness, and I agree with the above. -  .mecc ? ?

## 2021-10-28 ENCOUNTER — Ambulatory Visit (INDEPENDENT_AMBULATORY_CARE_PROVIDER_SITE_OTHER): Payer: Medicare HMO | Admitting: Physician Assistant

## 2021-10-28 ENCOUNTER — Encounter (INDEPENDENT_AMBULATORY_CARE_PROVIDER_SITE_OTHER): Payer: Self-pay | Admitting: Physician Assistant

## 2021-10-28 VITALS — BP 146/84 | HR 90 | Temp 98.1°F | Ht 63.0 in | Wt 204.0 lb

## 2021-10-28 DIAGNOSIS — Z7984 Long term (current) use of oral hypoglycemic drugs: Secondary | ICD-10-CM | POA: Diagnosis not present

## 2021-10-28 DIAGNOSIS — E1169 Type 2 diabetes mellitus with other specified complication: Secondary | ICD-10-CM

## 2021-10-28 DIAGNOSIS — E669 Obesity, unspecified: Secondary | ICD-10-CM | POA: Diagnosis not present

## 2021-10-28 DIAGNOSIS — Z6837 Body mass index (BMI) 37.0-37.9, adult: Secondary | ICD-10-CM | POA: Diagnosis not present

## 2021-10-29 NOTE — Progress Notes (Signed)
? ? ? ?Chief Complaint:  ? ?OBESITY ?Molly [name] is here to discuss her progress with her obesity treatment plan along with follow-up of her obesity related diagnoses. Molly [name] is on the Category 1 Plan and states she is following her eating plan approximately 85% of the time. Molly [name] states she is doing yoga for 45 minutes 2-3 times per week. ? ?Today's visit was #: 13 ?Starting weight: 223 lbs ?Starting date: 08/31/2017 ?Today's weight: 204 lbs ?Today's date: 10/28/2021 ?Total lbs lost to date: 19 lbs ?Total lbs lost since last in-office visit: 2 lbs ? ?Interim History: Molly [name] is not skipping as many meals and tries to make up for the food if she misses it in a meal. She stopped drinking Pedialyte and ginger ale. She is going to do a sugar detox at church soon.  ? ?Subjective:  ? ?1. Type 2 diabetes mellitus with other specified complication, without long-term current use of insulin ([place]) ?Molly [name]'s last fasting blood sugar was in the range of 90-105. She is on Metformin twice daily. Her last A1C was 6.1. She denies hypoglycemia.  ? ?Assessment/Plan:  ? ?1. Type 2 diabetes mellitus with other specified complication, without long-term current use of insulin ([place]) ?Molly [name] will continue with medications and the plan. Good blood sugar control is important to decrease the likelihood of diabetic complications such as nephropathy, neuropathy, limb loss, blindness, coronary artery disease, and death. Intensive lifestyle modification including diet, exercise and weight loss are the first line of treatment for diabetes.  ? ?2. Obesity with current BMI of 37.03 ?Molly [name] is currently in the action stage of change. As such, her goal is to continue with weight loss efforts. She has agreed to the Category 1 Plan.  ? ?Exercise goals:  As is. ? ?Behavioral modification strategies: increasing lean protein intake and no skipping meals. ? ?Molly [name] has agreed to follow-up with our clinic in 4 weeks. She was informed of the importance of frequent follow-up visits  to maximize her success with intensive lifestyle modifications for her multiple health conditions.  ? ?Objective:  ? ?Blood pressure (!) 146/84, pulse 90, temperature 98.1 ?F (36.7 ?C), height '5\' 3"'$  (1.6 m), weight 204 lb (92.5 kg), SpO2 99 %. ?Body mass index is 36.14 kg/m?. ? ?General: Cooperative, alert, well developed, in no acute distress. ?HEENT: Conjunctivae and lids unremarkable. ?Cardiovascular: Regular rhythm.  ?Lungs: Normal work of breathing. ?Neurologic: No focal deficits.  ? ?Lab Results  ?Component Value Date  ? CREATININE 1.03 (H) 07/14/2021  ? BUN 16 07/14/2021  ? NA 140 07/14/2021  ? K 3.8 07/14/2021  ? CL 99 07/14/2021  ? CO2 26 07/14/2021  ? ?Lab Results  ?Component Value Date  ? ALT 16 07/14/2021  ? AST 18 07/14/2021  ? ALKPHOS 55 07/14/2021  ? BILITOT 0.4 07/14/2021  ? ?Lab Results  ?Component Value Date  ? HGBA1C 6.1 (H) 07/14/2021  ? HGBA1C 6.3 (H) 01/20/2021  ? HGBA1C 6.2 (H) 07/10/2020  ? HGBA1C 5.9 (H) 12/14/2019  ? HGBA1C 5.9 (H) 06/05/2019  ? ?Lab Results  ?Component Value Date  ? INSULIN 18.4 07/14/2021  ? INSULIN 17.6 01/20/2021  ? INSULIN 12.0 07/10/2020  ? INSULIN 16.0 12/14/2019  ? INSULIN 22.5 06/05/2019  ? ?Lab Results  ?Component Value Date  ? TSH 1.680 08/31/2017  ? ?Lab Results  ?Component Value Date  ? CHOL 149 07/14/2021  ? HDL 63 07/14/2021  ? [place] 71 07/14/2021  ? TRIG 79 07/14/2021  ? CHOLHDL 2.4 07/14/2021  ? ?Lab Results  ?  Component Value Date  ? VD25OH 50.3 07/14/2021  ? VD25OH 48.9 01/20/2021  ? VD25OH 47.9 07/10/2020  ? ?Lab Results  ?Component Value Date  ? WBC 4.4 09/12/2017  ? HGB 15.0 09/12/2017  ? HCT 43.7 09/12/2017  ? MCV 92.6 09/12/2017  ? PLT 173 09/12/2017  ? ?No results found for: IRON, TIBC, FERRITIN ? ?Obesity Behavioral Intervention:  ? ?Approximately 15 minutes were spent on the discussion below. ? ?ASK: ?We discussed the diagnosis of obesity with Molly [name] today and Molly [name] agreed to give [name] permission to discuss obesity behavioral modification therapy  today. ? ?ASSESS: ?Molly [name] has the diagnosis of obesity and her BMI today is 36.3. Molly [name] is in the action stage of change.  ? ?ADVISE: ?Molly [name] was educated on the multiple health risks of obesity as well as the benefit of weight loss to improve her health. She was advised of the need for long term treatment and the importance of lifestyle modifications to improve her current health and to decrease her risk of future health problems. ? ?AGREE: ?Multiple dietary modification options and treatment options were discussed and Molly [name] agreed to follow the recommendations documented in the above note. ? ?ARRANGE: ?Molly [name] was educated on the importance of frequent visits to treat obesity as outlined per CMS and USPSTF guidelines and agreed to schedule her next follow up appointment today. ? ?Attestation Statements:  ? ?Reviewed by clinician on day of visit: allergies, medications, problem list, medical history, surgical history, family history, social history, and previous encounter notes. ? ?I, Tonye Pearson, am acting as Location manager for [employer], PA-C. ? ?I have reviewed the above documentation for accuracy and completeness, and I agree with the above. Abby Potash, PA-C ? ?

## 2021-12-01 ENCOUNTER — Ambulatory Visit (INDEPENDENT_AMBULATORY_CARE_PROVIDER_SITE_OTHER): Payer: Medicare HMO | Admitting: Physician Assistant

## 2021-12-01 ENCOUNTER — Encounter (INDEPENDENT_AMBULATORY_CARE_PROVIDER_SITE_OTHER): Payer: Self-pay | Admitting: Adult Health

## 2021-12-01 ENCOUNTER — Ambulatory Visit (INDEPENDENT_AMBULATORY_CARE_PROVIDER_SITE_OTHER): Payer: Medicare HMO | Admitting: Adult Health

## 2021-12-01 VITALS — BP 145/77 | HR 90 | Temp 97.8°F | Ht 63.0 in | Wt 204.0 lb

## 2021-12-01 DIAGNOSIS — E669 Obesity, unspecified: Secondary | ICD-10-CM | POA: Diagnosis not present

## 2021-12-01 DIAGNOSIS — Z6836 Body mass index (BMI) 36.0-36.9, adult: Secondary | ICD-10-CM | POA: Diagnosis not present

## 2021-12-01 DIAGNOSIS — Z7984 Long term (current) use of oral hypoglycemic drugs: Secondary | ICD-10-CM

## 2021-12-01 DIAGNOSIS — E1169 Type 2 diabetes mellitus with other specified complication: Secondary | ICD-10-CM

## 2021-12-09 NOTE — Progress Notes (Signed)
Chief Complaint:   OBESITY Libi is here to discuss her progress with her obesity treatment plan along with follow-up of her obesity related diagnoses. Lory is on the Category 1 Plan and states she is following her eating plan approximately 80% of the time. Alayha states she is doing yoga 45 minutes 2- times per week.  Today's visit was #: 55 Starting weight: 223 lbs Starting date: 08/31/2017 Today's weight: 204 lbs Today's date: 12/01/2021 Total lbs lost to date: 19 lbs Total lbs lost since last in-office visit: 0  Interim History:  Deloyce practices yoga 45 minutes 2-3 times per week.  She is challenged to consume all protein on Cat 1 meal.  Subjective:   1. Type 2 diabetes mellitus with other specified complication, without long-term current use of insulin ([place]) Ambulatory fasting blood glucose 90-110. 07/14/2021 A1c, 6.1, Insulin level 18.4.  09/12/2021 A1c, 6.2.   Per [name] she is on Metformin 500 mg BID with meals, managed by [facility]. She denies family hx of MTC or MENS-2.  She denies personal hx of pancreatitis.  Assessment/Plan:   1. Type 2 diabetes mellitus with other specified complication, without long-term current use of insulin ([facility]) Check A1c at next appointment. Continue Metformin as directed by PCP.   Consider GLP-1 initiation at next office visit.  2. Obesity with current BMI of 36.3 Check fasting labs at next visit.  Uri is currently in the action stage of change. As such, her goal is to continue with weight loss efforts. She has agreed to the Category 1 Plan.   Exercise goals:  As is.   Behavioral modification strategies: increasing lean protein intake, decreasing simple carbohydrates, meal planning and cooking strategies, keeping healthy foods in the home, and planning for success.  Chemika has agreed to follow-up with our clinic in 4 weeks. She was informed of the importance of frequent follow-up visits to maximize her success with  intensive lifestyle modifications for her multiple health conditions.   Objective:   Blood pressure (!) 145/77, pulse 90, temperature 97.8 F (36.6 C), height '5\' 3"'$  (1.6 m), weight 204 lb (92.5 kg), SpO2 97 %. Body mass index is 36.14 kg/m.  General: Cooperative, alert, well developed, in no acute distress. HEENT: Conjunctivae and lids unremarkable. Cardiovascular: Regular rhythm.  Lungs: Normal work of breathing. Neurologic: No focal deficits.   Lab Results  Component Value Date   CREATININE 1.03 (H) 07/14/2021   BUN 16 07/14/2021   NA 140 07/14/2021   K 3.8 07/14/2021   CL 99 07/14/2021   CO2 26 07/14/2021   Lab Results  Component Value Date   ALT 16 07/14/2021   AST 18 07/14/2021   ALKPHOS 55 07/14/2021   BILITOT 0.4 07/14/2021   Lab Results  Component Value Date   HGBA1C 6.1 (H) 07/14/2021   HGBA1C 6.3 (H) 01/20/2021   HGBA1C 6.2 (H) 07/10/2020   HGBA1C 5.9 (H) 12/14/2019   HGBA1C 5.9 (H) 06/05/2019   Lab Results  Component Value Date   INSULIN 18.4 07/14/2021   INSULIN 17.6 01/20/2021   INSULIN 12.0 07/10/2020   INSULIN 16.0 12/14/2019   INSULIN 22.5 06/05/2019   Lab Results  Component Value Date   TSH 1.680 08/31/2017   Lab Results  Component Value Date   CHOL 149 07/14/2021   HDL 63 07/14/2021   LDLCALC 71 07/14/2021   TRIG 79 07/14/2021   CHOLHDL 2.4 07/14/2021   Lab Results  Component Value Date   VD25OH 50.3  07/14/2021   VD25OH 48.9 01/20/2021   VD25OH 47.9 07/10/2020   Lab Results  Component Value Date   WBC 4.4 09/12/2017   HGB 15.0 09/12/2017   HCT 43.7 09/12/2017   MCV 92.6 09/12/2017   PLT 173 09/12/2017   No results found for: IRON, TIBC, FERRITIN  Obesity Behavioral Intervention:   Approximately 15 minutes were spent on the discussion below.  ASK: We discussed the diagnosis of obesity with [name] today and Sahily agreed to give [name] permission to discuss obesity behavioral modification therapy today.  ASSESS: Adasyn has the  diagnosis of obesity and her BMI today is 36.3. Hamna is in the action stage of change.   ADVISE: Mickey was educated on the multiple health risks of obesity as well as the benefit of weight loss to improve her health. She was advised of the need for long term treatment and the importance of lifestyle modifications to improve her current health and to decrease her risk of future health problems.  AGREE: Multiple dietary modification options and treatment options were discussed and Annleigh agreed to follow the recommendations documented in the above note.  ARRANGE: Nathalya was educated on the importance of frequent visits to treat obesity as outlined per CMS and USPSTF guidelines and agreed to schedule her next follow up appointment today.  Attestation Statements:   Reviewed by clinician on day of visit: allergies, medications, problem list, medical history, surgical history, family history, social history, and previous encounter notes.  Time spent on visit including pre-visit chart review and post-visit care and charting was 28 minutes.   I, Davy Pique, RMA, am acting as Location manager for Mina Marble, NP.  I have reviewed the above documentation for accuracy and completeness, and I agree with the above. -  Malvina Schadler d. Linkin Vizzini, NP-C

## 2021-12-12 DIAGNOSIS — H524 Presbyopia: Secondary | ICD-10-CM | POA: Diagnosis not present

## 2021-12-16 DIAGNOSIS — D125 Benign neoplasm of sigmoid colon: Secondary | ICD-10-CM | POA: Diagnosis not present

## 2021-12-16 DIAGNOSIS — E119 Type 2 diabetes mellitus without complications: Secondary | ICD-10-CM | POA: Diagnosis not present

## 2021-12-16 DIAGNOSIS — Z1211 Encounter for screening for malignant neoplasm of colon: Secondary | ICD-10-CM | POA: Diagnosis not present

## 2021-12-16 DIAGNOSIS — D120 Benign neoplasm of cecum: Secondary | ICD-10-CM | POA: Diagnosis not present

## 2021-12-16 DIAGNOSIS — D123 Benign neoplasm of transverse colon: Secondary | ICD-10-CM | POA: Diagnosis not present

## 2021-12-18 DIAGNOSIS — E119 Type 2 diabetes mellitus without complications: Secondary | ICD-10-CM | POA: Diagnosis not present

## 2021-12-18 DIAGNOSIS — H25812 Combined forms of age-related cataract, left eye: Secondary | ICD-10-CM | POA: Diagnosis not present

## 2021-12-18 DIAGNOSIS — H401122 Primary open-angle glaucoma, left eye, moderate stage: Secondary | ICD-10-CM | POA: Diagnosis not present

## 2021-12-18 DIAGNOSIS — H401111 Primary open-angle glaucoma, right eye, mild stage: Secondary | ICD-10-CM | POA: Diagnosis not present

## 2022-01-01 ENCOUNTER — Ambulatory Visit (INDEPENDENT_AMBULATORY_CARE_PROVIDER_SITE_OTHER): Payer: Medicare HMO | Admitting: Adult Health

## 2022-01-01 ENCOUNTER — Encounter (INDEPENDENT_AMBULATORY_CARE_PROVIDER_SITE_OTHER): Payer: Self-pay | Admitting: Adult Health

## 2022-01-01 VITALS — BP 119/72 | HR 81 | Temp 97.5°F | Ht 63.0 in | Wt 202.0 lb

## 2022-01-01 DIAGNOSIS — E559 Vitamin D deficiency, unspecified: Secondary | ICD-10-CM

## 2022-01-01 DIAGNOSIS — Z7984 Long term (current) use of oral hypoglycemic drugs: Secondary | ICD-10-CM

## 2022-01-01 DIAGNOSIS — E669 Obesity, unspecified: Secondary | ICD-10-CM

## 2022-01-01 DIAGNOSIS — E1169 Type 2 diabetes mellitus with other specified complication: Secondary | ICD-10-CM | POA: Diagnosis not present

## 2022-01-01 DIAGNOSIS — Z6835 Body mass index (BMI) 35.0-35.9, adult: Secondary | ICD-10-CM

## 2022-01-02 DIAGNOSIS — H401122 Primary open-angle glaucoma, left eye, moderate stage: Secondary | ICD-10-CM | POA: Diagnosis not present

## 2022-01-05 DIAGNOSIS — E1169 Type 2 diabetes mellitus with other specified complication: Secondary | ICD-10-CM | POA: Diagnosis not present

## 2022-01-05 DIAGNOSIS — E559 Vitamin D deficiency, unspecified: Secondary | ICD-10-CM | POA: Diagnosis not present

## 2022-01-05 NOTE — Progress Notes (Signed)
Chief Complaint:   OBESITY Molly [name] is here to discuss her progress with her obesity treatment plan along with follow-up of her obesity related diagnoses. Molly [name] is on the Category 1 Plan and states she is following her eating plan approximately 85% of the time. Molly [name] states she is at the [facility] for 45 minutes 1-2 times per week.  Today's visit was #: 65 Starting weight: 223 lbs Starting date: 08/31/2017 Today's weight: 202 lbs Today's date: 01/01/2022 Total lbs lost to date: 21 Total lbs lost since last in-office visit: 2  Interim History: When she eats out-she enjoys [employer]- she will choose Baked fish, rarely fried fish.   She prepares salmon in the air fryer at home.    Needs to update PCP-currently reflects Dr. Dennard Nip.  Subjective:   1. Type 2 diabetes mellitus with other specified complication, without long-term current use of insulin (HCC) She is on metformin 500 mg twice daily- manage by PCP/Donna Drosinis, PA-C. Fasting blood glucose range between 90-105.  Postprandial ranges between 130-140.  2. Vitamin D deficiency She is on OTC vitamin D3 5000 units 1 capsule 3 times per week.  Assessment/Plan:   1. Type 2 diabetes mellitus with other specified complication, without long-term current use of insulin (HCC) We will check labs today, and we will follow-up at her next office visit.  - Comprehensive metabolic panel - Hemoglobin A1c - Insulin, random - Vitamin B12  2. Vitamin D deficiency We will check labs today, and we will follow-up at her next office visit.  - VITAMIN D 25 Hydroxy (Vit-D Deficiency, Fractures)  3. Obesity with current BMI of 35.9 Molly [name] is currently in the action stage of change. As such, her goal is to continue with weight loss efforts. She has agreed to the Category 1 Plan.   Handout: 100-calorie snack sheet.  Exercise goals: As is.   Behavioral modification strategies: increasing lean protein intake, decreasing simple  carbohydrates, meal planning and cooking strategies, keeping healthy foods in the home, and planning for success.  Owen has agreed to follow-up with our clinic in 4 weeks. She was informed of the importance of frequent follow-up visits to maximize her success with intensive lifestyle modifications for her multiple health conditions.   Molly [name] was informed we would discuss her lab results at her next visit unless there is a critical issue that needs to be addressed sooner. Molly [name] agreed to keep her next visit at the agreed upon time to discuss these results.  Objective:   Blood pressure 119/72, pulse 81, temperature (!) 97.5 F (36.4 C), height '5\' 3"'$  (1.6 m), weight 202 lb (91.6 kg), SpO2 98 %. Body mass index is 35.78 kg/m.  General: Cooperative, alert, well developed, in no acute distress. HEENT: Conjunctivae and lids unremarkable. Cardiovascular: Regular rhythm.  Lungs: Normal work of breathing. Neurologic: No focal deficits.   Lab Results  Component Value Date   CREATININE 1.03 (H) 07/14/2021   BUN 16 07/14/2021   NA 140 07/14/2021   K 3.8 07/14/2021   CL 99 07/14/2021   CO2 26 07/14/2021   Lab Results  Component Value Date   ALT 16 07/14/2021   AST 18 07/14/2021   ALKPHOS 55 07/14/2021   BILITOT 0.4 07/14/2021   Lab Results  Component Value Date   HGBA1C 6.1 (H) 07/14/2021   HGBA1C 6.3 (H) 01/20/2021   HGBA1C 6.2 (H) 07/10/2020   HGBA1C 5.9 (H) 12/14/2019   HGBA1C 5.9 (H) 06/05/2019   Lab Results  Component Value Date   INSULIN 18.4 07/14/2021   INSULIN 17.6 01/20/2021   INSULIN 12.0 07/10/2020   INSULIN 16.0 12/14/2019   INSULIN 22.5 06/05/2019   Lab Results  Component Value Date   TSH 1.680 08/31/2017   Lab Results  Component Value Date   CHOL 149 07/14/2021   HDL 63 07/14/2021   LDLCALC 71 07/14/2021   TRIG 79 07/14/2021   CHOLHDL 2.4 07/14/2021   Lab Results  Component Value Date   VD25OH 50.3 07/14/2021   VD25OH 48.9 01/20/2021   VD25OH 47.9  07/10/2020   Lab Results  Component Value Date   WBC 4.4 09/12/2017   HGB 15.0 09/12/2017   HCT 43.7 09/12/2017   MCV 92.6 09/12/2017   PLT 173 09/12/2017   No results found for: "IRON", "TIBC", "FERRITIN"  Obesity Behavioral Intervention:   Approximately 15 minutes were spent on the discussion below.  ASK: We discussed the diagnosis of obesity with Molly [name] today and Molly [name] agreed to give Molly [name] permission to discuss obesity behavioral modification therapy today.  ASSESS: Molly [name] has the diagnosis of obesity and her BMI today is 35.9. Molly [name] is in the action stage of change.   ADVISE: Molly [name] was educated on the multiple health risks of obesity as well as the benefit of weight loss to improve her health. She was advised of the need for long term treatment and the importance of lifestyle modifications to improve her current health and to decrease her risk of future health problems.  AGREE: Multiple dietary modification options and treatment options were discussed and Molly [name] agreed to follow the recommendations documented in the above note.  ARRANGE: Molly [name] was educated on the importance of frequent visits to treat obesity as outlined per CMS and USPSTF guidelines and agreed to schedule her next follow up appointment today.  Attestation Statements:   Reviewed by clinician on day of visit: allergies, medications, problem list, medical history, surgical history, family history, social history, and previous encounter notes.   Molly [name], am acting as transcriptionist for Molly Marble, NP.  I have reviewed the above documentation for accuracy and completeness, and I agree with the above. -  Geneveive Furness d. Halei Hanover, NP-C

## 2022-01-06 LAB — COMPREHENSIVE METABOLIC PANEL
ALT: 19 IU/L (ref 0–32)
AST: 19 IU/L (ref 0–40)
Albumin/Globulin Ratio: 1.8 (ref 1.2–2.2)
Albumin: 4.8 g/dL — ABNORMAL HIGH (ref 3.7–4.7)
Alkaline Phosphatase: 51 IU/L (ref 44–121)
BUN/Creatinine Ratio: 15 (ref 12–28)
BUN: 14 mg/dL (ref 8–27)
Bilirubin Total: 0.4 mg/dL (ref 0.0–1.2)
CO2: 27 mmol/L (ref 20–29)
Calcium: 10.6 mg/dL — ABNORMAL HIGH (ref 8.7–10.3)
Chloride: 102 mmol/L (ref 96–106)
Creatinine, Ser: 0.94 mg/dL (ref 0.57–1.00)
Globulin, Total: 2.7 g/dL (ref 1.5–4.5)
Glucose: 121 mg/dL — ABNORMAL HIGH (ref 70–99)
Potassium: 3.6 mmol/L (ref 3.5–5.2)
Sodium: 142 mmol/L (ref 134–144)
Total Protein: 7.5 g/dL (ref 6.0–8.5)
eGFR: 63 mL/min/{1.73_m2} (ref >59)

## 2022-01-06 LAB — HEMOGLOBIN A1C
Est. average glucose Bld gHb Est-mCnc: 134 mg/dL
Hgb A1c MFr Bld: 6.3 % — ABNORMAL HIGH (ref 4.8–5.6)

## 2022-01-06 LAB — INSULIN, RANDOM: INSULIN: 25.2 u[IU]/mL — ABNORMAL HIGH (ref 2.6–24.9)

## 2022-01-06 LAB — VITAMIN D 25 HYDROXY (VIT D DEFICIENCY, FRACTURES): Vit D, 25-Hydroxy: 59.2 ng/mL (ref 30.0–100.0)

## 2022-01-06 LAB — VITAMIN B12: Vitamin B-12: 738 pg/mL (ref 232–1245)

## 2022-01-23 DIAGNOSIS — H401111 Primary open-angle glaucoma, right eye, mild stage: Secondary | ICD-10-CM | POA: Diagnosis not present

## 2022-01-23 DIAGNOSIS — H401122 Primary open-angle glaucoma, left eye, moderate stage: Secondary | ICD-10-CM | POA: Diagnosis not present

## 2022-02-04 DIAGNOSIS — Z1231 Encounter for screening mammogram for malignant neoplasm of breast: Secondary | ICD-10-CM | POA: Diagnosis not present

## 2022-02-05 ENCOUNTER — Encounter (INDEPENDENT_AMBULATORY_CARE_PROVIDER_SITE_OTHER): Payer: Self-pay | Admitting: Adult Health

## 2022-02-05 ENCOUNTER — Ambulatory Visit (INDEPENDENT_AMBULATORY_CARE_PROVIDER_SITE_OTHER): Payer: Medicare HMO | Admitting: Adult Health

## 2022-02-05 VITALS — BP 146/77 | HR 83 | Temp 97.9°F | Ht 63.0 in | Wt 204.0 lb

## 2022-02-05 DIAGNOSIS — E669 Obesity, unspecified: Secondary | ICD-10-CM | POA: Diagnosis not present

## 2022-02-05 DIAGNOSIS — Z6836 Body mass index (BMI) 36.0-36.9, adult: Secondary | ICD-10-CM

## 2022-02-05 DIAGNOSIS — E1169 Type 2 diabetes mellitus with other specified complication: Secondary | ICD-10-CM

## 2022-02-05 DIAGNOSIS — Z7984 Long term (current) use of oral hypoglycemic drugs: Secondary | ICD-10-CM

## 2022-02-05 DIAGNOSIS — E559 Vitamin D deficiency, unspecified: Secondary | ICD-10-CM

## 2022-02-05 DIAGNOSIS — I152 Hypertension secondary to endocrine disorders: Secondary | ICD-10-CM

## 2022-02-05 DIAGNOSIS — E1159 Type 2 diabetes mellitus with other circulatory complications: Secondary | ICD-10-CM | POA: Diagnosis not present

## 2022-02-09 NOTE — Progress Notes (Signed)
Chief Complaint:   OBESITY Molly [name] is here to discuss her progress with her obesity treatment plan along with follow-up of her obesity related diagnoses. Molly [name] is on {[email or web address]/plan2:23431} and states she is following her eating plan approximately ***% of the time. Edit states she is *** *** minutes *** times per week.  Today's visit was #: 37 Starting weight: 223 lbs Starting date: 08/31/2017 Today's weight: 204 lbs Today's date: 02/05/2022 Total lbs lost to date: 19 Total lbs lost since last in-office visit: 0  Interim History: ***  Subjective:   1. Vitamin D deficiency ***  2. Type 2 diabetes mellitus with other specified complication, without long-term current use of insulin (HCC) ***  3. Hypertension associated with type 2 diabetes mellitus (HCC) ***  Assessment/Plan:   1. Vitamin D deficiency ***  2. Type 2 diabetes mellitus with other specified complication, without long-term current use of insulin (HCC) ***  3. Hypertension associated with type 2 diabetes mellitus (HCC) ***  4. Obesity with current BMI of 36.2 Molly [name] is currently in the action stage of change. As such, her goal is to continue with weight loss efforts. She has agreed to the Category 1 Plan.   Exercise goals: As is.   Behavioral modification strategies: increasing lean protein intake, decreasing simple carbohydrates, meal planning and cooking strategies, keeping healthy foods in the home, and planning for success.  Molly [name] has agreed to follow-up with our clinic in 4 weeks. She was informed of the importance of frequent follow-up visits to maximize her success with intensive lifestyle modifications for her multiple health conditions.   Objective:   Blood pressure (!) 146/77, pulse 83, temperature 97.9 F (36.6 C), height '5\' 3"'$  (1.6 m), weight 204 lb (92.5 kg), SpO2 98 %. Body mass index is 36.14 kg/m.  General: Cooperative, alert, well developed, in no acute distress. HEENT: Conjunctivae  and lids unremarkable. Cardiovascular: Regular rhythm.  Lungs: Normal work of breathing. Neurologic: No focal deficits.   Lab Results  Component Value Date   CREATININE 0.94 01/05/2022   BUN 14 01/05/2022   NA 142 01/05/2022   K 3.6 01/05/2022   CL 102 01/05/2022   CO2 27 01/05/2022   Lab Results  Component Value Date   ALT 19 01/05/2022   AST 19 01/05/2022   ALKPHOS 51 01/05/2022   BILITOT 0.4 01/05/2022   Lab Results  Component Value Date   HGBA1C 6.3 (H) 01/05/2022   HGBA1C 6.1 (H) 07/14/2021   HGBA1C 6.3 (H) 01/20/2021   HGBA1C 6.2 (H) 07/10/2020   HGBA1C 5.9 (H) 12/14/2019   Lab Results  Component Value Date   INSULIN 25.2 (H) 01/05/2022   INSULIN 18.4 07/14/2021   INSULIN 17.6 01/20/2021   INSULIN 12.0 07/10/2020   INSULIN 16.0 12/14/2019   Lab Results  Component Value Date   TSH 1.680 08/31/2017   Lab Results  Component Value Date   CHOL 149 07/14/2021   HDL 63 07/14/2021   LDLCALC 71 07/14/2021   TRIG 79 07/14/2021   CHOLHDL 2.4 07/14/2021   Lab Results  Component Value Date   VD25OH 59.2 01/05/2022   VD25OH 50.3 07/14/2021   VD25OH 48.9 01/20/2021   Lab Results  Component Value Date   WBC 4.4 09/12/2017   HGB 15.0 09/12/2017   HCT 43.7 09/12/2017   MCV 92.6 09/12/2017   PLT 173 09/12/2017   No results found for: "IRON", "TIBC", "FERRITIN"  Obesity Behavioral Intervention:   Approximately 15 minutes were spent on the  discussion below.  ASK: We discussed the diagnosis of obesity with Molly [name] today and Molly [name] agreed to give [name] permission to discuss obesity behavioral modification therapy today.  ASSESS: Molly [name] has the diagnosis of obesity and her BMI today is 36.2. Molly [name] is in the action stage of change.   ADVISE: Molly [name] was educated on the multiple health risks of obesity as well as the benefit of weight loss to improve her health. She was advised of the need for long term treatment and the importance of lifestyle modifications to improve her  current health and to decrease her risk of future health problems.  AGREE: Multiple dietary modification options and treatment options were discussed and Molly [name] agreed to follow the recommendations documented in the above note.  ARRANGE: Molly [name] was educated on the importance of frequent visits to treat obesity as outlined per CMS and USPSTF guidelines and agreed to schedule her next follow up appointment today.  Attestation Statements:   Reviewed by clinician on day of visit: allergies, medications, problem list, medical history, surgical history, family history, social history, and previous encounter notes.   Wilhemena Durie, am acting as transcriptionist for Mina Marble, NP.  I have reviewed the above documentation for accuracy and completeness, and I agree with the above. -  ***

## 2022-02-11 DIAGNOSIS — Z0100 Encounter for examination of eyes and vision without abnormal findings: Secondary | ICD-10-CM | POA: Diagnosis not present

## 2022-02-11 DIAGNOSIS — H401122 Primary open-angle glaucoma, left eye, moderate stage: Secondary | ICD-10-CM | POA: Diagnosis not present

## 2022-03-04 ENCOUNTER — Encounter (INDEPENDENT_AMBULATORY_CARE_PROVIDER_SITE_OTHER): Payer: Self-pay

## 2022-03-05 ENCOUNTER — Ambulatory Visit (INDEPENDENT_AMBULATORY_CARE_PROVIDER_SITE_OTHER): Payer: Medicare HMO | Admitting: Adult Health

## 2022-03-05 ENCOUNTER — Encounter (INDEPENDENT_AMBULATORY_CARE_PROVIDER_SITE_OTHER): Payer: Self-pay | Admitting: Adult Health

## 2022-03-05 VITALS — BP 144/84 | HR 83 | Temp 97.8°F | Ht 63.0 in | Wt 201.0 lb

## 2022-03-05 DIAGNOSIS — E1169 Type 2 diabetes mellitus with other specified complication: Secondary | ICD-10-CM

## 2022-03-05 DIAGNOSIS — I152 Hypertension secondary to endocrine disorders: Secondary | ICD-10-CM | POA: Diagnosis not present

## 2022-03-05 DIAGNOSIS — E1159 Type 2 diabetes mellitus with other circulatory complications: Secondary | ICD-10-CM

## 2022-03-05 DIAGNOSIS — Z6835 Body mass index (BMI) 35.0-35.9, adult: Secondary | ICD-10-CM | POA: Diagnosis not present

## 2022-03-05 DIAGNOSIS — Z7984 Long term (current) use of oral hypoglycemic drugs: Secondary | ICD-10-CM

## 2022-03-05 DIAGNOSIS — E669 Obesity, unspecified: Secondary | ICD-10-CM | POA: Diagnosis not present

## 2022-03-12 NOTE — Progress Notes (Signed)
Chief Complaint:   OBESITY Molly [name] is here to discuss her progress with her obesity treatment plan along with follow-up of her obesity related diagnoses. Molly [name] is on the Category 1 Plan and states she is following her eating plan approximately 70% of the time. Molly [name] states she is doing 0 minutes 0 times per week.  Today's visit was #: 52 Starting weight: 223 lbs Starting date: 08/31/2017 Today's weight: 201 lbs Today's date: 03/05/2022 Total lbs lost to date: 22 Total lbs lost since last in-office visit: 3  Interim History:  Molly [name] endorses stable energy levels.  She reports a solid 8-9 hours of sleep each night.   Subjective:   1. Type 2 diabetes mellitus with other specified complication, without long-term current use of insulin (Molly [name]) Molly [name]'s home readings fasting <120 and postprandial <140.  She takes morning metformin 500 mg with breakfast and PM metformin 500 mg 2-3 hours after dinner.   2. Hypertension associated with type 2 diabetes mellitus (Molly [name]) [facility]'s home readings systolic blood pressure 950-932 and diastolic 67'T.  She uses an upper arm blood pressure cuff.  She denies acute cardiac symptoms.  PCP manages amlodipine 10 mg once daily, losartan 100 mg once daily, and chlorthalidone 25 mg once daily.   Assessment/Plan:   1. Type 2 diabetes mellitus with other specified complication, without long-term current use of insulin (Molly [name]) Molly [name] will take metformin 500 mg with meals, specifically dinner.   2. Hypertension associated with type 2 diabetes mellitus (Molly [name]) Continue amlodipine 10 mg once daily, losartan 100 mg once daily, and chlorthalidone 25 mg once daily.  3. Obesity with current BMI of 35.7 Molly [name] is currently in the action stage of change. As such, her goal is to continue with weight loss efforts. She has agreed to the Category 1 Plan.   Exercise goals: Older adults should follow the adult guidelines. When older adults cannot meet the adult guidelines, they should be  as physically active as their abilities and conditions will allow.   Behavioral modification strategies: increasing lean protein intake, decreasing simple carbohydrates, meal planning and cooking strategies, keeping healthy foods in the home, and planning for success.  Molly [name] has agreed to follow-up with our clinic in 4 weeks. She was informed of the importance of frequent follow-up visits to maximize her success with intensive lifestyle modifications for her multiple health conditions.   Objective:   Blood pressure (!) 144/84, pulse 83, temperature 97.8 F (36.6 C), height '5\' 3"'$  (1.6 m), weight 201 lb (91.2 kg), SpO2 97 %. Body mass index is 35.61 kg/m.  General: Cooperative, alert, well developed, in no acute distress. HEENT: Conjunctivae and lids unremarkable. Cardiovascular: Regular rhythm.  Lungs: Normal work of breathing. Neurologic: No focal deficits.   Lab Results  Component Value Date   CREATININE 0.94 01/05/2022   BUN 14 01/05/2022   NA 142 01/05/2022   K 3.6 01/05/2022   CL 102 01/05/2022   CO2 27 01/05/2022   Lab Results  Component Value Date   ALT 19 01/05/2022   AST 19 01/05/2022   ALKPHOS 51 01/05/2022   BILITOT 0.4 01/05/2022   Lab Results  Component Value Date   HGBA1C 6.3 (H) 01/05/2022   HGBA1C 6.1 (H) 07/14/2021   HGBA1C 6.3 (H) 01/20/2021   HGBA1C 6.2 (H) 07/10/2020   HGBA1C 5.9 (H) 12/14/2019   Lab Results  Component Value Date   INSULIN 25.2 (H) 01/05/2022   INSULIN 18.4 07/14/2021   INSULIN 17.6 01/20/2021   INSULIN 12.0  07/10/2020   INSULIN 16.0 12/14/2019   Lab Results  Component Value Date   TSH 1.680 08/31/2017   Lab Results  Component Value Date   CHOL 149 07/14/2021   HDL 63 07/14/2021   LDLCALC 71 07/14/2021   TRIG 79 07/14/2021   CHOLHDL 2.4 07/14/2021   Lab Results  Component Value Date   VD25OH 59.2 01/05/2022   VD25OH 50.3 07/14/2021   VD25OH 48.9 01/20/2021   Lab Results  Component Value Date   WBC 4.4 09/12/2017    HGB 15.0 09/12/2017   HCT 43.7 09/12/2017   MCV 92.6 09/12/2017   PLT 173 09/12/2017   No results found for: "IRON", "TIBC", "FERRITIN"  Attestation Statements:   Reviewed by clinician on day of visit: allergies, medications, problem list, medical history, surgical history, family history, social history, and previous encounter notes.  Time spent on visit including pre-visit chart review and post-visit care and charting was 27 minutes.   Wilhemena Durie, am acting as transcriptionist for Mina Marble, Molly [name].  I have reviewed the above documentation for accuracy and completeness, and I agree with the above. -  Molly Tweten d. Avila Albritton, Molly [name]-C

## 2022-03-18 DIAGNOSIS — F3342 Major depressive disorder, recurrent, in full remission: Secondary | ICD-10-CM | POA: Diagnosis not present

## 2022-03-18 DIAGNOSIS — Z7984 Long term (current) use of oral hypoglycemic drugs: Secondary | ICD-10-CM | POA: Diagnosis not present

## 2022-03-18 DIAGNOSIS — Z79899 Other long term (current) drug therapy: Secondary | ICD-10-CM | POA: Diagnosis not present

## 2022-03-18 DIAGNOSIS — Z0000 Encounter for general adult medical examination without abnormal findings: Secondary | ICD-10-CM | POA: Diagnosis not present

## 2022-03-18 DIAGNOSIS — Z87891 Personal history of nicotine dependence: Secondary | ICD-10-CM | POA: Diagnosis not present

## 2022-03-18 DIAGNOSIS — D696 Thrombocytopenia, unspecified: Secondary | ICD-10-CM | POA: Diagnosis not present

## 2022-03-18 DIAGNOSIS — E1159 Type 2 diabetes mellitus with other circulatory complications: Secondary | ICD-10-CM | POA: Diagnosis not present

## 2022-03-18 DIAGNOSIS — E118 Type 2 diabetes mellitus with unspecified complications: Secondary | ICD-10-CM | POA: Diagnosis not present

## 2022-03-18 DIAGNOSIS — E785 Hyperlipidemia, unspecified: Secondary | ICD-10-CM | POA: Diagnosis not present

## 2022-03-18 DIAGNOSIS — I152 Hypertension secondary to endocrine disorders: Secondary | ICD-10-CM | POA: Diagnosis not present

## 2022-04-02 ENCOUNTER — Encounter (INDEPENDENT_AMBULATORY_CARE_PROVIDER_SITE_OTHER): Payer: Self-pay | Admitting: Adult Health

## 2022-04-02 ENCOUNTER — Ambulatory Visit (INDEPENDENT_AMBULATORY_CARE_PROVIDER_SITE_OTHER): Payer: Medicare HMO | Admitting: Adult Health

## 2022-04-02 VITALS — BP 144/84 | HR 78 | Temp 97.9°F | Ht 63.0 in | Wt 202.0 lb

## 2022-04-02 DIAGNOSIS — Z7984 Long term (current) use of oral hypoglycemic drugs: Secondary | ICD-10-CM

## 2022-04-02 DIAGNOSIS — E669 Obesity, unspecified: Secondary | ICD-10-CM

## 2022-04-02 DIAGNOSIS — E1169 Type 2 diabetes mellitus with other specified complication: Secondary | ICD-10-CM

## 2022-04-02 DIAGNOSIS — Z6835 Body mass index (BMI) 35.0-35.9, adult: Secondary | ICD-10-CM | POA: Diagnosis not present

## 2022-04-05 NOTE — Progress Notes (Signed)
Chief Complaint:   OBESITY Molly [name] is here to discuss her progress with her obesity treatment plan along with follow-up of her obesity related diagnoses. Molly [name] is on the Category 1 Plan and states she is following her eating plan approximately 85% of the time. Molly [name] states she is doing yoga and walking 15-45 minutes 2 times per week.  Today's visit was #: 82 Starting weight: 223 lbs  Starting date: 08/31/2017 Today's weight: 202 lbs Today's date: 04/02/2022 Total lbs lost to date: 21 lbs Total lbs lost since last in-office visit: +1 lb  Interim History:  Molly [name] recently traveled to [place], [place] to visit family and to celebrate labor day.   She endorses eating off plan while in [place].  Subjective:   1. Type 2 diabetes mellitus with other specified complication, without long-term current use of insulin (HCC) Lab Results  Component Value Date   HGBA1C 6.3 (H) 01/05/2022   HGBA1C 6.1 (H) 07/14/2021   HGBA1C 6.3 (H) 01/20/2021    Last 3 A1c levels at goal! Only on metformin 500 mg BID with meals.  Fasting blood glucose 100's.  She denies sx;s of hypoglycemia.  Assessment/Plan:   1. Type 2 diabetes mellitus with other specified complication, without long-term current use of insulin (HCC) Continue Metformin '500mg'$  BID.  2. Obesity with current BMI of 35.9 Handouts:  100-200 snack calories sheet.   Molly [name] is currently in the action stage of change. As such, her goal is to continue with weight loss efforts. She has agreed to the Category 1 Plan.   Exercise goals:  As is.   Behavioral modification strategies: increasing lean protein intake, decreasing simple carbohydrates, meal planning and cooking strategies, keeping healthy foods in the home, and planning for success.  Molly [name] has agreed to follow-up with our clinic in 4 weeks. She was informed of the importance of frequent follow-up visits to maximize her success with intensive lifestyle modifications for her multiple health  conditions.   Objective:   Blood pressure (!) 144/84, pulse 78, temperature 97.9 F (36.6 C), height '5\' 3"'$  (1.6 m), weight 202 lb (91.6 kg), SpO2 98 %. Body mass index is 35.78 kg/m.  General: Cooperative, alert, well developed, in no acute distress. HEENT: Conjunctivae and lids unremarkable. Cardiovascular: Regular rhythm.  Lungs: Normal work of breathing. Neurologic: No focal deficits.   Lab Results  Component Value Date   CREATININE 0.94 01/05/2022   BUN 14 01/05/2022   NA 142 01/05/2022   K 3.6 01/05/2022   CL 102 01/05/2022   CO2 27 01/05/2022   Lab Results  Component Value Date   ALT 19 01/05/2022   AST 19 01/05/2022   ALKPHOS 51 01/05/2022   BILITOT 0.4 01/05/2022   Lab Results  Component Value Date   HGBA1C 6.3 (H) 01/05/2022   HGBA1C 6.1 (H) 07/14/2021   HGBA1C 6.3 (H) 01/20/2021   HGBA1C 6.2 (H) 07/10/2020   HGBA1C 5.9 (H) 12/14/2019   Lab Results  Component Value Date   INSULIN 25.2 (H) 01/05/2022   INSULIN 18.4 07/14/2021   INSULIN 17.6 01/20/2021   INSULIN 12.0 07/10/2020   INSULIN 16.0 12/14/2019   Lab Results  Component Value Date   TSH 1.680 08/31/2017   Lab Results  Component Value Date   CHOL 149 07/14/2021   HDL 63 07/14/2021   LDLCALC 71 07/14/2021   TRIG 79 07/14/2021   CHOLHDL 2.4 07/14/2021   Lab Results  Component Value Date   VD25OH 59.2 01/05/2022  VD25OH 50.3 07/14/2021   VD25OH 48.9 01/20/2021   Lab Results  Component Value Date   WBC 4.4 09/12/2017   HGB 15.0 09/12/2017   HCT 43.7 09/12/2017   MCV 92.6 09/12/2017   PLT 173 09/12/2017   No results found for: "IRON", "TIBC", "FERRITIN"  Obesity Behavioral Intervention:   Approximately 15 minutes were spent on the discussion below.  ASK: We discussed the diagnosis of obesity with Molly [name] today and Molly [name] agreed to give [name] permission to discuss obesity behavioral modification therapy today.  ASSESS: Molly [name] has the diagnosis of obesity and her BMI today is 35.9. Molly [name]  is in the action stage of change.   ADVISE: Molly [name] was educated on the multiple health risks of obesity as well as the benefit of weight loss to improve her health. She was advised of the need for long term treatment and the importance of lifestyle modifications to improve her current health and to decrease her risk of future health problems.  AGREE: Multiple dietary modification options and treatment options were discussed and Molly [name] agreed to follow the recommendations documented in the above note.  ARRANGE: Molly [name] was educated on the importance of frequent visits to treat obesity as outlined per CMS and USPSTF guidelines and agreed to schedule her next follow up appointment today.  Attestation Statements:   Reviewed by clinician on day of visit: allergies, medications, problem list, medical history, surgical history, family history, social history, and previous encounter notes.  I, Davy Pique, RMA, am acting as Location manager for Mina Marble, NP.  I have reviewed the above documentation for accuracy and completeness, and I agree with the above. -  Eon Zunker d. Marlaya Turck, NP-C

## 2022-04-30 ENCOUNTER — Encounter (INDEPENDENT_AMBULATORY_CARE_PROVIDER_SITE_OTHER): Payer: Self-pay | Admitting: Adult Health

## 2022-04-30 ENCOUNTER — Ambulatory Visit (INDEPENDENT_AMBULATORY_CARE_PROVIDER_SITE_OTHER): Payer: Medicare HMO | Admitting: Adult Health

## 2022-04-30 VITALS — BP 138/84 | HR 85 | Temp 97.9°F | Ht 63.0 in | Wt 201.0 lb

## 2022-04-30 DIAGNOSIS — E669 Obesity, unspecified: Secondary | ICD-10-CM | POA: Diagnosis not present

## 2022-04-30 DIAGNOSIS — Z6835 Body mass index (BMI) 35.0-35.9, adult: Secondary | ICD-10-CM

## 2022-04-30 DIAGNOSIS — E1159 Type 2 diabetes mellitus with other circulatory complications: Secondary | ICD-10-CM | POA: Diagnosis not present

## 2022-04-30 DIAGNOSIS — I152 Hypertension secondary to endocrine disorders: Secondary | ICD-10-CM | POA: Diagnosis not present

## 2022-04-30 DIAGNOSIS — E1169 Type 2 diabetes mellitus with other specified complication: Secondary | ICD-10-CM | POA: Diagnosis not present

## 2022-04-30 DIAGNOSIS — Z7984 Long term (current) use of oral hypoglycemic drugs: Secondary | ICD-10-CM

## 2022-05-11 NOTE — Progress Notes (Signed)
Chief Complaint:   OBESITY Molly [name] is here to discuss her progress with her obesity treatment plan along with follow-up of her obesity related diagnoses. Molly [name] is on the Category 1 Plan and states she is following her eating plan approximately 85% of the time. Molly [name] states she is doing yoga 45 minutes 2 times per week.  Today's visit was #: 68 Starting weight: 223 lbs Starting date: 08/31/2017 Today's weight: 201 lbs Today's date: 04/30/2022 Total lbs lost to date: 22 lbs Total lbs lost since last in-office visit: 1 lb  Interim History:  Since last office visit she experienced, URI with acute sx's of nasal drainage, non productive cough, and chills.   She experienced acute symptoms for 3 days.   She denied headache or fever.   She was treated with OTC coricidin.   During URI, she has a poor appetite.    Subjective:   1. Hypertension associated with type 2 diabetes mellitus (HCC) BP slightly elevated at OV. 01/05/2022 CMP, renal function and electrolytes were stable. She is on Norvasc '10mg'$  QD, Hygroton '25mg'$  QD, Cozaar '100mg'$  QD.  2. Type 2 diabetes mellitus with other specified complication, without long-term current use of insulin (HCC) Fasting BG 100s and Post Prandial levels <130.   She is taking metformin 500 mg BID, she has never been on any other antidiabetic medications.  PCP manages metformin therapy, follow ups every 3 months.  Lab Results  Component Value Date   HGBA1C 6.3 (H) 01/05/2022   HGBA1C 6.1 (H) 07/14/2021   HGBA1C 6.3 (H) 01/20/2021    Assessment/Plan:   1. Hypertension associated with type 2 diabetes mellitus ([place]) Continue on Norvasc '10mg'$  QD, Hygroton '25mg'$  QD, Cozaar '100mg'$  QD.  2. Type 2 diabetes mellitus with other specified complication, without long-term current use of insulin (HCC) Continue metformin 500 mg BID per PCP.   3. Obesity with current BMI of 35.7 Handout:  100 calorie snack sheet and Halloween tips.  Molly [name] is currently in the action  stage of change. As such, her goal is to continue with weight loss efforts. She has agreed to the Category 1 Plan.   Exercise goals:  As is.   Behavioral modification strategies: increasing lean protein intake, decreasing simple carbohydrates, meal planning and cooking strategies, keeping healthy foods in the home, and planning for success.  Molly [name] has agreed to follow-up with our clinic in 4 weeks. She was informed of the importance of frequent follow-up visits to maximize her success with intensive lifestyle modifications for her multiple health conditions.   Objective:   Blood pressure 138/84, pulse 85, temperature 97.9 F (36.6 C), height '5\' 3"'$  (1.6 m), weight 201 lb (91.2 kg), SpO2 96 %. Body mass index is 35.61 kg/m.  General: Cooperative, alert, well developed, in no acute distress. HEENT: Conjunctivae and lids unremarkable. Cardiovascular: Regular rhythm.  Lungs: Normal work of breathing. Neurologic: No focal deficits.   Lab Results  Component Value Date   CREATININE 0.94 01/05/2022   BUN 14 01/05/2022   NA 142 01/05/2022   K 3.6 01/05/2022   CL 102 01/05/2022   CO2 27 01/05/2022   Lab Results  Component Value Date   ALT 19 01/05/2022   AST 19 01/05/2022   ALKPHOS 51 01/05/2022   BILITOT 0.4 01/05/2022   Lab Results  Component Value Date   HGBA1C 6.3 (H) 01/05/2022   HGBA1C 6.1 (H) 07/14/2021   HGBA1C 6.3 (H) 01/20/2021   HGBA1C 6.2 (H) 07/10/2020   HGBA1C 5.9 (  H) 12/14/2019   Lab Results  Component Value Date   INSULIN 25.2 (H) 01/05/2022   INSULIN 18.4 07/14/2021   INSULIN 17.6 01/20/2021   INSULIN 12.0 07/10/2020   INSULIN 16.0 12/14/2019   Lab Results  Component Value Date   TSH 1.680 08/31/2017   Lab Results  Component Value Date   CHOL 149 07/14/2021   HDL 63 07/14/2021   LDLCALC 71 07/14/2021   TRIG 79 07/14/2021   CHOLHDL 2.4 07/14/2021   Lab Results  Component Value Date   VD25OH 59.2 01/05/2022   VD25OH 50.3 07/14/2021   VD25OH 48.9  01/20/2021   Lab Results  Component Value Date   WBC 4.4 09/12/2017   HGB 15.0 09/12/2017   HCT 43.7 09/12/2017   MCV 92.6 09/12/2017   PLT 173 09/12/2017   No results found for: "IRON", "TIBC", "FERRITIN"  Attestation Statements:   Reviewed by clinician on day of visit: allergies, medications, problem list, medical history, surgical history, family history, social history, and previous encounter notes.  I, Davy Pique, RMA, am acting as Location manager for Mina Marble, NP.  I have reviewed the above documentation for accuracy and completeness, and I agree with the above. -  Shaman Muscarella d. Cyrstal Leitz, NP-C

## 2022-05-13 DIAGNOSIS — Z23 Encounter for immunization: Secondary | ICD-10-CM | POA: Diagnosis not present

## 2022-06-04 ENCOUNTER — Ambulatory Visit (INDEPENDENT_AMBULATORY_CARE_PROVIDER_SITE_OTHER): Payer: Medicare HMO | Admitting: Family Medicine

## 2022-06-09 ENCOUNTER — Encounter (INDEPENDENT_AMBULATORY_CARE_PROVIDER_SITE_OTHER): Payer: Self-pay | Admitting: Family Medicine

## 2022-06-09 ENCOUNTER — Ambulatory Visit (INDEPENDENT_AMBULATORY_CARE_PROVIDER_SITE_OTHER): Payer: Medicare HMO | Admitting: Family Medicine

## 2022-06-09 VITALS — BP 154/89 | HR 90 | Temp 98.5°F | Ht 63.0 in | Wt 204.0 lb

## 2022-06-09 DIAGNOSIS — E1169 Type 2 diabetes mellitus with other specified complication: Secondary | ICD-10-CM | POA: Diagnosis not present

## 2022-06-09 DIAGNOSIS — Z7984 Long term (current) use of oral hypoglycemic drugs: Secondary | ICD-10-CM

## 2022-06-09 DIAGNOSIS — I10 Essential (primary) hypertension: Secondary | ICD-10-CM

## 2022-06-09 DIAGNOSIS — Z6836 Body mass index (BMI) 36.0-36.9, adult: Secondary | ICD-10-CM | POA: Diagnosis not present

## 2022-06-09 DIAGNOSIS — E669 Obesity, unspecified: Secondary | ICD-10-CM

## 2022-06-19 NOTE — Progress Notes (Signed)
Chief Complaint:   OBESITY Molly [name] is here to discuss her progress with her obesity treatment plan along with follow-up of her obesity related diagnoses. Molly [name] is on the Category 1 Plan and states she is following her eating plan approximately 80% of the time. Molly [name] states she is yoga and [employer] 45 minutes 2 times per week.  Today's visit was #: 72 Starting weight: 223 lbs Starting date: 08/31/2017 Today's weight: 201 lbs Today's date: 06/09/2022 Total lbs lost to date: 22 lbs Total lbs lost since last in-office visit: +3 lbs  Interim History: Had some food not on plan.  Eating out more often. Socializing more.  Going to the beach for Thanksgiving.  [facility] [place] [facility]. Snacking on grandchildren's foods.  Working on getting in protein with meals.   Subjective:   1. Type 2 diabetes mellitus with other specified complication, unspecified whether long term insulin use ([place]) She is on metformin 500 mg BID, she is tolerating well.  Last A1c 6.3.    2. Essential hypertension Blood pressure is elevated today.  Has not taken her blood pressure medications this morning.  She takes amlodipine 10 mg and chlorthalidone 25 mg and losartan 100 mg daily.   Assessment/Plan:   1. Type 2 diabetes mellitus with other specified complication, unspecified whether long term insulin use ([place]) Continue metformin per PCP, continue to work on reducing sugar intake and increasing physical activity.   2. Essential hypertension Continue all blood pressure medications as directed.   3. Obesity, current BMI 36.2 1) Increase water intake to >64 oz per day.   2) Yoga at home or the [facility] 3-4 times per week.   Molly [name] is currently in the action stage of change. As such, her goal is to continue with weight loss efforts. She has agreed to the Category 1 Plan.   Exercise goals:  As is.   Behavioral modification strategies: increasing lean protein intake, increasing water intake, increasing  high fiber foods, decreasing eating out, keeping healthy foods in the home, better snacking choices, holiday eating strategies , and decreasing junk food.  Molly [name] has agreed to follow-up with our clinic in 4 weeks. She was informed of the importance of frequent follow-up visits to maximize her success with intensive lifestyle modifications for her multiple health conditions.   Objective:   Blood pressure (!) 154/89, pulse 90, temperature 98.5 F (36.9 C), height '5\' 3"'$  (1.6 m), weight 204 lb (92.5 kg), SpO2 98 %. Body mass index is 36.14 kg/m.  General: Cooperative, alert, well developed, in no acute distress. HEENT: Conjunctivae and lids unremarkable. Cardiovascular: Regular rhythm.  Lungs: Normal work of breathing. Neurologic: No focal deficits.   Lab Results  Component Value Date   CREATININE 0.94 01/05/2022   BUN 14 01/05/2022   NA 142 01/05/2022   K 3.6 01/05/2022   CL 102 01/05/2022   CO2 27 01/05/2022   Lab Results  Component Value Date   ALT 19 01/05/2022   AST 19 01/05/2022   ALKPHOS 51 01/05/2022   BILITOT 0.4 01/05/2022   Lab Results  Component Value Date   HGBA1C 6.3 (H) 01/05/2022   HGBA1C 6.1 (H) 07/14/2021   HGBA1C 6.3 (H) 01/20/2021   HGBA1C 6.2 (H) 07/10/2020   HGBA1C 5.9 (H) 12/14/2019   Lab Results  Component Value Date   INSULIN 25.2 (H) 01/05/2022   INSULIN 18.4 07/14/2021   INSULIN 17.6 01/20/2021   INSULIN 12.0 07/10/2020  INSULIN 16.0 12/14/2019   Lab Results  Component Value Date   TSH 1.680 08/31/2017   Lab Results  Component Value Date   CHOL 149 07/14/2021   HDL 63 07/14/2021   LDLCALC 71 07/14/2021   TRIG 79 07/14/2021   CHOLHDL 2.4 07/14/2021   Lab Results  Component Value Date   VD25OH 59.2 01/05/2022   VD25OH 50.3 07/14/2021   VD25OH 48.9 01/20/2021   Lab Results  Component Value Date   WBC 4.4 09/12/2017   HGB 15.0 09/12/2017   HCT 43.7 09/12/2017   MCV 92.6 09/12/2017   PLT 173 09/12/2017   No results found  for: "IRON", "TIBC", "FERRITIN"  Attestation Statements:   Reviewed by clinician on day of visit: allergies, medications, problem list, medical history, surgical history, family history, social history, and previous encounter notes.  I, Davy Pique, am acting as Location manager for Loyal Gambler, DO.  I have reviewed the above documentation for accuracy and completeness, and I agree with the above. Dell Ponto, DO

## 2022-06-23 DIAGNOSIS — R197 Diarrhea, unspecified: Secondary | ICD-10-CM | POA: Diagnosis not present

## 2022-06-23 DIAGNOSIS — K219 Gastro-esophageal reflux disease without esophagitis: Secondary | ICD-10-CM | POA: Diagnosis not present

## 2022-06-23 DIAGNOSIS — R110 Nausea: Secondary | ICD-10-CM | POA: Diagnosis not present

## 2022-06-23 DIAGNOSIS — E118 Type 2 diabetes mellitus with unspecified complications: Secondary | ICD-10-CM | POA: Diagnosis not present

## 2022-06-25 DIAGNOSIS — K7689 Other specified diseases of liver: Secondary | ICD-10-CM | POA: Diagnosis not present

## 2022-06-25 DIAGNOSIS — R945 Abnormal results of liver function studies: Secondary | ICD-10-CM | POA: Diagnosis not present

## 2022-06-30 DIAGNOSIS — Z1159 Encounter for screening for other viral diseases: Secondary | ICD-10-CM | POA: Diagnosis not present

## 2022-06-30 DIAGNOSIS — R7989 Other specified abnormal findings of blood chemistry: Secondary | ICD-10-CM | POA: Diagnosis not present

## 2022-06-30 DIAGNOSIS — E876 Hypokalemia: Secondary | ICD-10-CM | POA: Diagnosis not present

## 2022-07-07 ENCOUNTER — Ambulatory Visit (INDEPENDENT_AMBULATORY_CARE_PROVIDER_SITE_OTHER): Payer: Medicare HMO | Admitting: Physician Assistant

## 2022-07-07 ENCOUNTER — Encounter (INDEPENDENT_AMBULATORY_CARE_PROVIDER_SITE_OTHER): Payer: Self-pay | Admitting: Physician Assistant

## 2022-07-07 VITALS — BP 133/81 | HR 81 | Temp 98.3°F | Ht 63.0 in | Wt 199.0 lb

## 2022-07-07 DIAGNOSIS — E1169 Type 2 diabetes mellitus with other specified complication: Secondary | ICD-10-CM | POA: Diagnosis not present

## 2022-07-07 DIAGNOSIS — Z6835 Body mass index (BMI) 35.0-35.9, adult: Secondary | ICD-10-CM

## 2022-07-07 DIAGNOSIS — I152 Hypertension secondary to endocrine disorders: Secondary | ICD-10-CM

## 2022-07-07 DIAGNOSIS — E1159 Type 2 diabetes mellitus with other circulatory complications: Secondary | ICD-10-CM | POA: Diagnosis not present

## 2022-07-07 DIAGNOSIS — R7989 Other specified abnormal findings of blood chemistry: Secondary | ICD-10-CM | POA: Diagnosis not present

## 2022-07-07 DIAGNOSIS — E669 Obesity, unspecified: Secondary | ICD-10-CM

## 2022-07-21 DIAGNOSIS — R7989 Other specified abnormal findings of blood chemistry: Secondary | ICD-10-CM | POA: Diagnosis not present

## 2022-07-21 DIAGNOSIS — R110 Nausea: Secondary | ICD-10-CM | POA: Diagnosis not present

## 2022-07-21 DIAGNOSIS — R1013 Epigastric pain: Secondary | ICD-10-CM | POA: Diagnosis not present

## 2022-07-23 NOTE — Progress Notes (Signed)
Chief Complaint:   OBESITY Molly [name] is here to discuss her progress with her obesity treatment plan along with follow-up of her obesity related diagnoses. Molly [name] is on the Category 1 Plan and states she is following her eating plan approximately 75% of the time. Molly [name] states she is going to the [employer] 45 minutes 2 times per week.  Today's visit was #: 19 Starting weight: 223 lbs Starting date: 08/31/2017 Today's weight: 199 lbs Today's date: 07/07/2022 Total lbs lost to date: 24 lbs Total lbs lost since last in-office visit: 2  Interim History: Molly [name] went to the beach on Thanksgiving and got off track with eating.   She has done well overall with her weight loss.  Some recent GI upset-nausea/belching a lot and saw her PCP at [place].  GI symptoms are better now after starting Protonix.   She is back on track with eating plan and exercise now.  Subjective:   1. Type 2 diabetes mellitus with other specified complication, unspecified whether long term insulin use (HCC) [name] is taking metformin 500 mg twice a day- denies any side effects.  Fasting Blood Sugar-90s.  Last A1c was 6.3/insulin was 25.2 on 01/05/2022.  Working on healthy eating plan/exercise to promote weight loss.  2. Hypertension associated with type 2 diabetes mellitus ([place]) Jasie's blood pressure at goal.  Taking Cozaar 100 mg daily, Norvasc 10 mg daily and Chlorthalidone 25 mg daily.  No side effects with medication.  3. Elevated liver function tests Molly [name] reports- saw PCP as was having GI upset with eating. They did Liver panel which showed mild elevation of ALT to 56. She was having belching and nausea and has started Protonix 40 mg daily with improvement in her symptoms.  Has a follow-up scheduled for 07/13/2022 with her PCP.  Assessment/Plan:   1. Type 2 diabetes mellitus with other specified complication, unspecified whether long term insulin use (HCC) Continue Metformin--Continue prescribed nutrition plan to  decrease simple carbohydrates, decrease saturated fat, increase lean protein and exercise to promote weight loss.  2. Hypertension associated with type 2 diabetes mellitus ([name]) Continue medications. Continue healthy eating plan and exercise to promote weight loss.   3. Elevated liver function tests Continue Protonix--Continue healthy eating plan and exercise to promote weight loss-follow up with PCP as scheduled.  4. Obesity, current BMI 35.4 Molly [name] is currently in the action stage of change. As such, her goal is to continue with weight loss efforts. She has agreed to the Category 1 Plan.   Exercise goals: As is.  Behavioral modification strategies: increasing lean protein intake, decreasing simple carbohydrates, no skipping meals, and holiday eating strategies .  Kariss has agreed to follow-up with our clinic in 4 weeks. She was informed of the importance of frequent follow-up visits to maximize her success with intensive lifestyle modifications for her multiple health conditions.   Objective:   Blood pressure 133/81, pulse 81, temperature 98.3 F (36.8 C), height '5\' 3"'$  (1.6 m), weight 199 lb (90.3 kg), SpO2 99 %. Body mass index is 35.25 kg/m.  General: Cooperative, alert, well developed, in no acute distress. HEENT: Conjunctivae and lids unremarkable. Cardiovascular: Regular rhythm.  Lungs: Normal work of breathing. Neurologic: No focal deficits.   Lab Results  Component Value Date   CREATININE 0.94 01/05/2022   BUN 14 01/05/2022   NA 142 01/05/2022   K 3.6 01/05/2022   CL 102 01/05/2022   CO2 27 01/05/2022   Lab Results  Component Value Date  ALT 19 01/05/2022   AST 19 01/05/2022   ALKPHOS 51 01/05/2022   BILITOT 0.4 01/05/2022   Lab Results  Component Value Date   HGBA1C 6.3 (H) 01/05/2022   HGBA1C 6.1 (H) 07/14/2021   HGBA1C 6.3 (H) 01/20/2021   HGBA1C 6.2 (H) 07/10/2020   HGBA1C 5.9 (H) 12/14/2019   Lab Results  Component Value Date   INSULIN 25.2 (H)  01/05/2022   INSULIN 18.4 07/14/2021   INSULIN 17.6 01/20/2021   INSULIN 12.0 07/10/2020   INSULIN 16.0 12/14/2019   Lab Results  Component Value Date   TSH 1.680 08/31/2017   Lab Results  Component Value Date   CHOL 149 07/14/2021   HDL 63 07/14/2021   LDLCALC 71 07/14/2021   TRIG 79 07/14/2021   CHOLHDL 2.4 07/14/2021   Lab Results  Component Value Date   VD25OH 59.2 01/05/2022   VD25OH 50.3 07/14/2021   VD25OH 48.9 01/20/2021   Lab Results  Component Value Date   WBC 4.4 09/12/2017   HGB 15.0 09/12/2017   HCT 43.7 09/12/2017   MCV 92.6 09/12/2017   PLT 173 09/12/2017   No results found for: "IRON", "TIBC", "FERRITIN"  Attestation Statements:   Reviewed by clinician on day of visit: allergies, medications, problem list, medical history, surgical history, family history, social history, and previous encounter notes.  I, Brendell Tyus, am acting as transcriptionist for [employer], PA.  I have reviewed the above documentation for accuracy and completeness, and I agree with the above. -  Sohan Potvin,PA-C

## 2022-07-30 DIAGNOSIS — R110 Nausea: Secondary | ICD-10-CM | POA: Diagnosis not present

## 2022-07-30 DIAGNOSIS — R1010 Upper abdominal pain, unspecified: Secondary | ICD-10-CM | POA: Diagnosis not present

## 2022-07-30 DIAGNOSIS — R109 Unspecified abdominal pain: Secondary | ICD-10-CM | POA: Diagnosis not present

## 2022-07-30 DIAGNOSIS — I700 Atherosclerosis of aorta: Secondary | ICD-10-CM | POA: Diagnosis not present

## 2022-07-30 DIAGNOSIS — R7989 Other specified abnormal findings of blood chemistry: Secondary | ICD-10-CM | POA: Diagnosis not present

## 2022-07-31 DIAGNOSIS — R748 Abnormal levels of other serum enzymes: Secondary | ICD-10-CM | POA: Diagnosis not present

## 2022-08-03 ENCOUNTER — Encounter (INDEPENDENT_AMBULATORY_CARE_PROVIDER_SITE_OTHER): Payer: Self-pay | Admitting: Physician Assistant

## 2022-08-03 ENCOUNTER — Ambulatory Visit (INDEPENDENT_AMBULATORY_CARE_PROVIDER_SITE_OTHER): Payer: Medicare HMO | Admitting: Physician Assistant

## 2022-08-03 VITALS — BP 131/82 | HR 84 | Temp 98.1°F | Ht 63.0 in | Wt 198.0 lb

## 2022-08-03 DIAGNOSIS — E1169 Type 2 diabetes mellitus with other specified complication: Secondary | ICD-10-CM

## 2022-08-03 DIAGNOSIS — R7989 Other specified abnormal findings of blood chemistry: Secondary | ICD-10-CM | POA: Diagnosis not present

## 2022-08-03 DIAGNOSIS — Z6835 Body mass index (BMI) 35.0-35.9, adult: Secondary | ICD-10-CM

## 2022-08-03 DIAGNOSIS — E1159 Type 2 diabetes mellitus with other circulatory complications: Secondary | ICD-10-CM

## 2022-08-03 DIAGNOSIS — Z7984 Long term (current) use of oral hypoglycemic drugs: Secondary | ICD-10-CM | POA: Diagnosis not present

## 2022-08-03 DIAGNOSIS — E669 Obesity, unspecified: Secondary | ICD-10-CM

## 2022-08-03 DIAGNOSIS — I152 Hypertension secondary to endocrine disorders: Secondary | ICD-10-CM

## 2022-08-10 NOTE — Progress Notes (Unsigned)
Chief Complaint:   OBESITY Molly [name] is here to discuss her progress with her obesity treatment plan along with follow-up of her obesity related diagnoses. [name] is on the Category 1 Plan and states she is following her eating plan approximately 80% of the time. [name] states she is doing Yoga classes 30 minutes 2-3 times per week.  Today's visit was #: 58 Starting weight: 223 lbs Starting date: 08/31/2017 Today's weight: 198 lbs Today's date: 08/03/2022 Total lbs lost to date: 25 Total lbs lost since last in-office visit: 1  Interim History: Molly [name] has done well with weight loss over the holidays. Over the last 2 weeks, she has been getting back on track with eating plan after problems with GI upset. Pt is to start swim classes back in March.  Subjective:   1. Type 2 diabetes mellitus with other specified complication, without long-term current use of insulin (HCC) Molly [name] is taking Metformin 500 mg twice a day with no side effects. Her fasting blood sugars run 98-low 100's and post prandial 130-140.  2. Hypertension associated with type 2 diabetes mellitus (HCC) BP at goal on recheck today. Pt is taking Hygroton 25 mg daily and Cozaar 100 mg daily with no side effects.  3. Elevated liver function tests Follow up with PCP- LFT's repeated and normal. Pt's GI symptoms have improved with Protonix, but she did have and ultrasound, which showed gallstones without obstruction (history of cholecystectomy).  Assessment/Plan:   1. Type 2 diabetes mellitus with other specified complication, without long-term current use of insulin (HCC) Continue Metformin, prudent nutritional plan, and exercise.  2. Hypertension associated with type 2 diabetes mellitus (HCC) Continue Hygroton, Cozaar, prudent nutritional plan, and exercise.  3. Elevated liver function tests Now asymptomatic. Continue Protonix.   4. Obesity, current BMI 35.2 Molly [name] is currently in the action stage of change. As such, her goal is to  continue with weight loss efforts. She has agreed to the Category 1 Plan.   Exercise goals:  As is  Behavioral modification strategies: increasing lean protein intake, decreasing simple carbohydrates, and planning for success.  [name] has agreed to follow-up with our clinic in 4 weeks. She was informed of the importance of frequent follow-up visits to maximize her success with intensive lifestyle modifications for her multiple health conditions.   Objective:   Blood pressure 131/82, pulse 84, temperature 98.1 F (36.7 C), height '5\' 3"'$  (1.6 m), weight 198 lb (89.8 kg), SpO2 97 %. Body mass index is 35.07 kg/m.  General: Cooperative, alert, well developed, in no acute distress. HEENT: Conjunctivae and lids unremarkable. Cardiovascular: Regular rhythm.  Lungs: Normal work of breathing. Neurologic: No focal deficits.   Lab Results  Component Value Date   CREATININE 0.94 01/05/2022   BUN 14 01/05/2022   NA 142 01/05/2022   K 3.6 01/05/2022   CL 102 01/05/2022   CO2 27 01/05/2022   Lab Results  Component Value Date   ALT 19 01/05/2022   AST 19 01/05/2022   ALKPHOS 51 01/05/2022   BILITOT 0.4 01/05/2022   Lab Results  Component Value Date   HGBA1C 6.3 (H) 01/05/2022   HGBA1C 6.1 (H) 07/14/2021   HGBA1C 6.3 (H) 01/20/2021   HGBA1C 6.2 (H) 07/10/2020   HGBA1C 5.9 (H) 12/14/2019   Lab Results  Component Value Date   INSULIN 25.2 (H) 01/05/2022   INSULIN 18.4 07/14/2021   INSULIN 17.6 01/20/2021   INSULIN 12.0 07/10/2020   INSULIN 16.0 12/14/2019   Lab Results  Component Value Date   TSH 1.680 08/31/2017   Lab Results  Component Value Date   CHOL 149 07/14/2021   HDL 63 07/14/2021   LDLCALC 71 07/14/2021   TRIG 79 07/14/2021   CHOLHDL 2.4 07/14/2021   Lab Results  Component Value Date   VD25OH 59.2 01/05/2022   VD25OH 50.3 07/14/2021   VD25OH 48.9 01/20/2021   Lab Results  Component Value Date   WBC 4.4 09/12/2017   HGB 15.0 09/12/2017   HCT 43.7  09/12/2017   MCV 92.6 09/12/2017   PLT 173 09/12/2017    Attestation Statements:   Reviewed by clinician on day of visit: allergies, medications, problem list, medical history, surgical history, family history, social history, and previous encounter notes.  Time spent on visit including pre-visit chart review and post-visit care and charting was 30 minutes.   I, Kathlene November, BS, CMA, am acting as transcriptionist for [employer], PA-C.  I have reviewed the above documentation for accuracy and completeness, and I agree with the above. -  ***

## 2022-08-31 ENCOUNTER — Ambulatory Visit (INDEPENDENT_AMBULATORY_CARE_PROVIDER_SITE_OTHER): Payer: Medicare HMO | Admitting: Physician Assistant

## 2022-08-31 ENCOUNTER — Encounter (INDEPENDENT_AMBULATORY_CARE_PROVIDER_SITE_OTHER): Payer: Self-pay

## 2022-09-02 ENCOUNTER — Encounter (INDEPENDENT_AMBULATORY_CARE_PROVIDER_SITE_OTHER): Payer: Self-pay | Admitting: Family Medicine

## 2022-09-02 ENCOUNTER — Ambulatory Visit (INDEPENDENT_AMBULATORY_CARE_PROVIDER_SITE_OTHER): Payer: Medicare HMO | Admitting: Family Medicine

## 2022-09-02 VITALS — BP 145/73 | HR 87 | Temp 97.8°F | Ht 63.0 in | Wt 197.0 lb

## 2022-09-02 DIAGNOSIS — Z7984 Long term (current) use of oral hypoglycemic drugs: Secondary | ICD-10-CM | POA: Diagnosis not present

## 2022-09-02 DIAGNOSIS — Z6835 Body mass index (BMI) 35.0-35.9, adult: Secondary | ICD-10-CM | POA: Insufficient documentation

## 2022-09-02 DIAGNOSIS — E669 Obesity, unspecified: Secondary | ICD-10-CM

## 2022-09-02 DIAGNOSIS — R109 Unspecified abdominal pain: Secondary | ICD-10-CM

## 2022-09-02 DIAGNOSIS — E1169 Type 2 diabetes mellitus with other specified complication: Secondary | ICD-10-CM

## 2022-09-15 DIAGNOSIS — R7989 Other specified abnormal findings of blood chemistry: Secondary | ICD-10-CM | POA: Diagnosis not present

## 2022-09-15 DIAGNOSIS — K8050 Calculus of bile duct without cholangitis or cholecystitis without obstruction: Secondary | ICD-10-CM | POA: Diagnosis not present

## 2022-09-16 NOTE — Progress Notes (Signed)
Chief Complaint:   OBESITY Molly [name] is here to discuss her progress with her obesity treatment plan along with follow-up of her obesity related diagnoses. Molly [name] is on the Category 1 Plan and states she is following her eating plan approximately 80% of the time. Molly [name] states she is doing yoga for 45 minutes 2-3 times per week.  Today's visit was #: 22 Starting weight: 223 lbs Starting date: 08/31/2017 Today's weight: 197 lbs Today's date: 09/02/2022 Total lbs lost to date: 26 Total lbs lost since last in-office visit: 1  Interim History: Molly [name] has been working on her diet and weight loss. She struggles to eat all of the food, especially meat and she is open to other eating plans.   Subjective:   1. Type 2 diabetes mellitus with other specified complication, without long-term current use of insulin (HCC) Molly [name] is on metformin and she is working on her diet. She is on a GLP-1 as she is being worked up for abdominal pain. I discussed labs with the patient today.   2. Abdominal pain, unspecified abdominal location Molly [name] has been having abdominal pain with dysphagia and frequent belching, worse with raw vegetables. She had a CT scan which I reviewed and reviewed and has a ultrasound scheduled.   Assessment/Plan:   1. Type 2 diabetes mellitus with other specified complication, without long-term current use of insulin (HCC) Molly [name] will continue the low carbohydrate diet and increase protein to regulate her glucose. A1c goal of below 6.5 was discussed.   2. Abdominal pain, unspecified abdominal location Molly [name] may have gastroparesis, so hopefully they will do a swallow study. I will continue to follow with the test results, so we can tailor an eating plan and possible medications as appropriate. We will follow-up in 3-4 weeks.   3. BMI 35.0-35.9,adult  4. Obesity, Beginning BMI 39.50 Molly [name] is currently in the action stage of change. As such, her goal is to continue with weight loss efforts. She has  agreed to the Category 2 Plan or the [place].   Exercise goals: As is.   Behavioral modification strategies: increasing lean protein intake.  Molly [name] has agreed to follow-up with our clinic in 4 weeks. She was informed of the importance of frequent follow-up visits to maximize her success with intensive lifestyle modifications for her multiple health conditions.   Objective:   Blood pressure (!) 145/73, pulse 87, temperature 97.8 F (36.6 C), height 5' 3"$  (1.6 m), weight 197 lb (89.4 kg), SpO2 96 %. Body mass index is 34.9 kg/m.  General: Cooperative, alert, well developed, in no acute distress. HEENT: Conjunctivae and lids unremarkable. Cardiovascular: Regular rhythm.  Lungs: Normal work of breathing. Neurologic: No focal deficits.   Lab Results  Component Value Date   CREATININE 0.94 01/05/2022   BUN 14 01/05/2022   NA 142 01/05/2022   K 3.6 01/05/2022   CL 102 01/05/2022   CO2 27 01/05/2022   Lab Results  Component Value Date   ALT 19 01/05/2022   AST 19 01/05/2022   ALKPHOS 51 01/05/2022   BILITOT 0.4 01/05/2022   Lab Results  Component Value Date   HGBA1C 6.3 (H) 01/05/2022   HGBA1C 6.1 (H) 07/14/2021   HGBA1C 6.3 (H) 01/20/2021   HGBA1C 6.2 (H) 07/10/2020   HGBA1C 5.9 (H) 12/14/2019   Lab Results  Component Value Date   INSULIN 25.2 (H) 01/05/2022   INSULIN 18.4 07/14/2021   INSULIN 17.6 01/20/2021   INSULIN 12.0 07/10/2020   INSULIN 16.0  12/14/2019   Lab Results  Component Value Date   TSH 1.680 08/31/2017   Lab Results  Component Value Date   CHOL 149 07/14/2021   HDL 63 07/14/2021   LDLCALC 71 07/14/2021   TRIG 79 07/14/2021   CHOLHDL 2.4 07/14/2021   Lab Results  Component Value Date   VD25OH 59.2 01/05/2022   VD25OH 50.3 07/14/2021   VD25OH 48.9 01/20/2021   Lab Results  Component Value Date   WBC 4.4 09/12/2017   HGB 15.0 09/12/2017   HCT 43.7 09/12/2017   MCV 92.6 09/12/2017   PLT 173 09/12/2017   No results found for:  "IRON", "TIBC", "FERRITIN"  Attestation Statements:   Reviewed by clinician on day of visit: allergies, medications, problem list, medical history, surgical history, family history, social history, and previous encounter notes.  Time spent on visit including pre-visit chart review and post-visit care and charting was 30 minutes.   I, Trixie Dredge, am acting as transcriptionist for Dennard Nip, MD.  I have reviewed the above documentation for accuracy and completeness, and I agree with the above. -  Dennard Nip, MD

## 2022-09-18 DIAGNOSIS — K219 Gastro-esophageal reflux disease without esophagitis: Secondary | ICD-10-CM | POA: Diagnosis not present

## 2022-09-18 DIAGNOSIS — E118 Type 2 diabetes mellitus with unspecified complications: Secondary | ICD-10-CM | POA: Diagnosis not present

## 2022-09-18 DIAGNOSIS — E559 Vitamin D deficiency, unspecified: Secondary | ICD-10-CM | POA: Diagnosis not present

## 2022-09-18 DIAGNOSIS — D704 Cyclic neutropenia: Secondary | ICD-10-CM | POA: Diagnosis not present

## 2022-09-18 DIAGNOSIS — K8050 Calculus of bile duct without cholangitis or cholecystitis without obstruction: Secondary | ICD-10-CM | POA: Diagnosis not present

## 2022-09-18 DIAGNOSIS — F3342 Major depressive disorder, recurrent, in full remission: Secondary | ICD-10-CM | POA: Diagnosis not present

## 2022-09-18 DIAGNOSIS — E785 Hyperlipidemia, unspecified: Secondary | ICD-10-CM | POA: Diagnosis not present

## 2022-09-18 DIAGNOSIS — D696 Thrombocytopenia, unspecified: Secondary | ICD-10-CM | POA: Diagnosis not present

## 2022-09-18 DIAGNOSIS — R7989 Other specified abnormal findings of blood chemistry: Secondary | ICD-10-CM | POA: Diagnosis not present

## 2022-09-18 DIAGNOSIS — I10 Essential (primary) hypertension: Secondary | ICD-10-CM | POA: Diagnosis not present

## 2022-09-30 ENCOUNTER — Ambulatory Visit (INDEPENDENT_AMBULATORY_CARE_PROVIDER_SITE_OTHER): Payer: Medicare HMO | Admitting: Family Medicine

## 2022-10-05 ENCOUNTER — Ambulatory Visit (INDEPENDENT_AMBULATORY_CARE_PROVIDER_SITE_OTHER): Payer: Medicare HMO | Admitting: Family Medicine

## 2022-10-05 ENCOUNTER — Encounter (INDEPENDENT_AMBULATORY_CARE_PROVIDER_SITE_OTHER): Payer: Self-pay | Admitting: Family Medicine

## 2022-10-05 VITALS — BP 143/86 | HR 78 | Temp 98.0°F | Ht 63.0 in | Wt 199.0 lb

## 2022-10-05 DIAGNOSIS — E559 Vitamin D deficiency, unspecified: Secondary | ICD-10-CM | POA: Diagnosis not present

## 2022-10-05 DIAGNOSIS — E1169 Type 2 diabetes mellitus with other specified complication: Secondary | ICD-10-CM | POA: Diagnosis not present

## 2022-10-05 DIAGNOSIS — J302 Other seasonal allergic rhinitis: Secondary | ICD-10-CM | POA: Diagnosis not present

## 2022-10-05 DIAGNOSIS — Z7984 Long term (current) use of oral hypoglycemic drugs: Secondary | ICD-10-CM | POA: Diagnosis not present

## 2022-10-05 DIAGNOSIS — Z6835 Body mass index (BMI) 35.0-35.9, adult: Secondary | ICD-10-CM | POA: Diagnosis not present

## 2022-10-05 DIAGNOSIS — E785 Hyperlipidemia, unspecified: Secondary | ICD-10-CM

## 2022-10-05 DIAGNOSIS — J3089 Other allergic rhinitis: Secondary | ICD-10-CM | POA: Diagnosis not present

## 2022-10-05 MED ORDER — LEVOCETIRIZINE DIHYDROCHLORIDE 5 MG PO TABS: 5.0000 mg | ORAL_TABLET | Freq: Every evening | ORAL | 0 refills | Status: DC

## 2022-10-05 NOTE — Progress Notes (Signed)
Molly [name], D.O.  ABFM, ABOM Specializing in Clinical Bariatric Medicine  Office located at: [address], [place]  [id number]     Assessment and Plan:   No orders of the defined types were placed in this encounter.   There are no discontinued medications.   No orders of the defined types were placed in this encounter.  Type 2 diabetes mellitus with other specified complication, without long-term current use of insulin (HCC) Assessment: Condition is At goal.. Labs were reviewed. Extensive discussion was had with patient regarding how the foods that they eat affect their labs.  Her last A1c was 6.1 on 09/18/22. She is compliant with metformin '500mg'$  BID- compliance good and tolerating well.  Plan:Continue prudent nutritional plan and Metformin regimen.   Vitamin D deficiency Assessment: Condition is At goal.. Labs were reviewed. She is taking vitamin D3 and multivitamin, tolerating well without complications- compliance good. Her last Vit D level was 65 09/18/22. Calcium just over high end of normal. PCP made no changes to regimen.  Plan:Continue D3 and multivitamin as recommended by PCP.   Seasonal allergies Other allergic rhinitis Assessment: Condition is Uncontrolled. . She has primarily 1-sided sinus congestion which previously improved with Xyzal during allergy season.  Plan:Refilled Xyzal nightly. May start Singulair if needed during allergy season.   Morbid obesity (HCC)-start bmi 39.50/date 08/31/2017 Assessment: Condition is Not at goal.. Biometric data collected today, was reviewed with patient.  She has gained .6lb in fat mass, 1lb in muscle mass, and 5lbs in total body water.  Plan:Continue category 2 or pescatarian meal plan- discussed increasing lean meat intake. Continue exercise regimen.   Hyperlipidemia associated with type 2 diabetes mellitus ([place]) Assessment: Condition is Improving, but not optimized.. Labs were reviewed. Extensive  discussion was had with patient regarding how the foods that they eat affect their labs.   Ref Range & Units 09/18/22   Total Cholesterol 25 - 199 MG/DL 176  Triglycerides 10 - 150 MG/DL 116  HDL Cholesterol 35 - 135 MG/DL 69  LDL Cholesterol Calculated 0 - 99 MG/DL 84  Total Chol / HDL Cholesterol <4.5 2.6  Non-HDL Cholesterol MG/DL 107   Plan:Cortney Rodier agrees to continue with our treatment plan of a heart-heathy, low cholesterol meal plan - We recommend: aerobic activity with eventual goal of a minimum of 150+ min wk plus 2 days/ week of resistance or strength training.   - Cardiovascular risk and specific lipid/LDL goals reviewed. - We extensively discussed several lifestyle modifications today and Margreta Journey will continue to work on diet, exercise and weight loss efforts.  - I stressed the importance that patient continue with our prudent nutritional plan that is low in saturated and trans fats, and low in fatty carbs to improve these numbers.   - We will continue routine screening as patient continues to achieve health goals along their weight loss journey Last lipid panel as above.    TREATMENT PLAN FOR OBESITY:  Recommended Dietary Goals Asees is currently in the action stage of change. As such, her goal is to continue weight management plan. She has agreed to the Category 2 Plan or the Pescatarain plan.   Behavioral Intervention We discussed the following Behavioral Modification Strategies today: increasing lean protein intake and work on meal planning and easy cooking plans. Additional resources provided today: category 2 meal plan information, lean meat and protein recommendations Evidence-based interventions for health behavior change were utilized today including the discussion of self monitoring techniques,  problem-solving barriers and SMART goal setting techniques.   Regarding patient's less desirable eating habits and patterns, we employed the technique of small changes.   Pt will specifically work on: consuming more lean protein for next visit.    Recommended Physical Activity Goals Tashanda has been advised to work up to 150 minutes of moderate intensity aerobic activity a week and strengthening exercises 2-3 times per week for cardiovascular health, weight loss maintenance and preservation of muscle mass.    FOLLOW UP: No follow-ups on file.[name] She was informed of the importance of frequent follow up visits to maximize her success with intensive lifestyle modifications for her multiple health conditions.  Weight Summary and Biometrics   Weight Lost Since Last Visit: +2lb  No data recorded  Vitals Temp: 98 F (36.7 C) BP: (!) 143/86 Pulse Rate: 78 SpO2: 100 %   Anthropometric Measurements Height: '5\' 3"'$  (1.6 m) Weight: 199 lb (90.3 kg) BMI (Calculated): 35.26 Weight at Last Visit: 197lb Weight Lost Since Last Visit: +2lb Starting Weight: 223lb Total Weight Loss (lbs): 24 lb (10.9 kg) Peak Weight: 230lb   Body Composition  Body Fat %: 44.8 % Fat Mass (lbs): 89.2 lbs Muscle Mass (lbs): 104.2 lbs Total Body Water (lbs): 79.2 lbs Visceral Fat Rating : 15   Other Clinical Data Fasting: no Labs: no Today's Visit #: 72 Starting Date: 08/31/17    Subjective:   Chief complaint: Obesity Molly [name] is here to discuss her progress with her obesity treatment plan. She is on the the Category 2 Plan or the [place]  and states she is following her eating plan approximately 70 % of the time. She states she is exercising 45 minutes 2 days per week.  Interval History:  Since last office visit she has not been exercising in the last 4-6 weeks due to loss of her sister-in-law. This also caused her to have difficulty following her meal plan. She is having trouble with getting enough protein each day. She was switched from category 1 to category 2/pescatarian plan at her last visit with Dr. Leafy Ro on 09/02/22 due to this challenge.  She previously  used Xyzal many years ago with relief of her allergies. She reports having sinus congestion primarily on 1 side with lying down- this typically resolves after getting up.  She reports that she is in the process of scheduling an ERCP. She has been told in the past that she may have diabetic gastroparesis. She states that she has less reflux issues if she eats smaller meals more frequently.  Review of Systems:  Pertinent positives were addressed with patient today.  Objective:   PHYSICAL EXAM:  Blood pressure (!) 143/86, pulse 78, temperature 98 F (36.7 C), height '5\' 3"'$  (1.6 m), weight 199 lb (90.3 kg), SpO2 100 %. Body mass index is 35.25 kg/m.  General: Well Developed, well nourished, and in no acute distress.  HEENT: Normocephalic, atraumatic Skin: Warm and dry, cap RF less 2 sec, good turgor Chest:  Normal excursion, shape, no gross abn Respiratory: speaking in full sentences, no conversational dyspnea NeuroM-Sk: Ambulates w/o assistance, moves * 4 Psych: A and O *3, insight good, mood-full   DIAGNOSTIC DATA REVIEWED:  BMET    Component Value Date/Time   NA 142 01/05/2022 0824   K 3.6 01/05/2022 0824   CL 102 01/05/2022 0824   CO2 27 01/05/2022 0824   GLUCOSE 121 (H) 01/05/2022 0824   GLUCOSE 134 (H) 09/12/2017 2118   BUN 14 01/05/2022 0824  CREATININE 0.94 01/05/2022 0824   CREATININE 0.90 03/23/2016 1116   CALCIUM 10.6 (H) 01/05/2022 0824   GFRNONAA 55 (L) 07/10/2020 0940   GFRAA 63 07/10/2020 0940   Lab Results  Component Value Date   HGBA1C 6.3 (H) 01/05/2022   HGBA1C (H) 08/31/2010    6.0 (NOTE)                                                                       According to the ADA Clinical Practice Recommendations for 2011, when HbA1c is used as a screening test:   >=6.5%   Diagnostic of Diabetes Mellitus           (if abnormal result  is confirmed)  5.7-6.4%   Increased risk of developing Diabetes Mellitus  References:Diagnosis and Classification of  Diabetes Mellitus,Diabetes [id number] 1):S62-S69 and Standards of Medical Care in         Diabetes - 2011,Diabetes Care,2011,34  (Suppl 1):S11-S61.   Lab Results  Component Value Date   INSULIN 25.2 (H) 01/05/2022   INSULIN 20.4 08/31/2017   Lab Results  Component Value Date   TSH 1.680 08/31/2017   CBC    Component Value Date/Time   WBC 4.4 09/12/2017 2118   RBC 4.72 09/12/2017 2118   HGB 15.0 09/12/2017 2118   HGB 14.0 08/31/2017 1140   HCT 43.7 09/12/2017 2118   HCT 41.2 08/31/2017 1140   PLT 173 09/12/2017 2118   MCV 92.6 09/12/2017 2118   MCV 92 08/31/2017 1140   MCH 31.8 09/12/2017 2118   MCHC 34.3 09/12/2017 2118   RDW 13.1 09/12/2017 2118   RDW 13.7 08/31/2017 1140   Iron Studies No results found for: "IRON", "TIBC", "FERRITIN", "IRONPCTSAT" Lipid Panel     Component Value Date/Time   CHOL 149 07/14/2021 0954   TRIG 79 07/14/2021 0954   HDL 63 07/14/2021 0954   CHOLHDL 2.4 07/14/2021 0954   CHOLHDL 2.6 08/31/2010 0540   VLDL 12 08/31/2010 0540   LDLCALC 71 07/14/2021 0954   Hepatic Function Panel     Component Value Date/Time   PROT 7.5 01/05/2022 0824   ALBUMIN 4.8 (H) 01/05/2022 0824   AST 19 01/05/2022 0824   ALT 19 01/05/2022 0824   ALKPHOS 51 01/05/2022 0824   BILITOT 0.4 01/05/2022 0824      Component Value Date/Time   TSH 1.680 08/31/2017 1140   Nutritional Lab Results  Component Value Date   VD25OH 59.2 01/05/2022   VD25OH 50.3 07/14/2021   VD25OH 48.9 01/20/2021    Attestations:   Reviewed by clinician on day of visit: allergies, medications, problem list, medical history, surgical history, family history, social history, and previous encounter notes.   Patient was in the office today and time spent on visit including pre-visit chart review and post-visit care/coordination of care and electronic medical record documentation was *** minutes. 50% of the time was in face to face counseling of this patient's medical condition(s)  and providing education on treatment options to include the first-line treatment of diet and lifestyle modification.   I,Alexis Herring,acting as a Education administrator for [employer], DO.,have documented all relevant documentation on the behalf of Mellody Dance, DO,as directed by  Mellody Dance, DO while in the presence of  Mellody Dance, DO.   I, Mellody Dance, DO, have reviewed all documentation for this visit. The documentation on 10/05/22 for the exam, diagnosis, procedures, and orders are all accurate and complete.

## 2022-10-20 DIAGNOSIS — K8050 Calculus of bile duct without cholangitis or cholecystitis without obstruction: Secondary | ICD-10-CM | POA: Diagnosis not present

## 2022-10-20 DIAGNOSIS — K838 Other specified diseases of biliary tract: Secondary | ICD-10-CM | POA: Diagnosis not present

## 2022-10-30 ENCOUNTER — Other Ambulatory Visit (INDEPENDENT_AMBULATORY_CARE_PROVIDER_SITE_OTHER): Payer: Self-pay | Admitting: Family Medicine

## 2022-10-30 DIAGNOSIS — J3089 Other allergic rhinitis: Secondary | ICD-10-CM

## 2022-11-05 ENCOUNTER — Encounter (INDEPENDENT_AMBULATORY_CARE_PROVIDER_SITE_OTHER): Payer: Self-pay | Admitting: Family Medicine

## 2022-11-05 ENCOUNTER — Ambulatory Visit (INDEPENDENT_AMBULATORY_CARE_PROVIDER_SITE_OTHER): Payer: Medicare HMO | Admitting: Family Medicine

## 2022-11-05 VITALS — BP 126/78 | HR 78 | Temp 98.0°F | Ht 63.0 in | Wt 196.8 lb

## 2022-11-05 DIAGNOSIS — K5900 Constipation, unspecified: Secondary | ICD-10-CM | POA: Diagnosis not present

## 2022-11-05 DIAGNOSIS — E559 Vitamin D deficiency, unspecified: Secondary | ICD-10-CM | POA: Diagnosis not present

## 2022-11-05 DIAGNOSIS — J302 Other seasonal allergic rhinitis: Secondary | ICD-10-CM

## 2022-11-05 DIAGNOSIS — E1169 Type 2 diabetes mellitus with other specified complication: Secondary | ICD-10-CM

## 2022-11-05 DIAGNOSIS — Z7984 Long term (current) use of oral hypoglycemic drugs: Secondary | ICD-10-CM

## 2022-11-05 DIAGNOSIS — Z6834 Body mass index (BMI) 34.0-34.9, adult: Secondary | ICD-10-CM | POA: Diagnosis not present

## 2022-11-05 DIAGNOSIS — Z6835 Body mass index (BMI) 35.0-35.9, adult: Secondary | ICD-10-CM

## 2022-11-05 MED ORDER — LEVOCETIRIZINE DIHYDROCHLORIDE 5 MG PO TABS: 5.0000 mg | ORAL_TABLET | Freq: Every evening | ORAL | 0 refills | Status: DC

## 2022-11-05 NOTE — Progress Notes (Signed)
Molly [name], D.O.  ABFM, ABOM Specializing in Clinical Bariatric Medicine  Office located at: [address] [place], [place]  [address]     Assessment and Plan:   No orders of the defined types were placed in this encounter.   Medications Discontinued During This Encounter  Medication Reason   levocetirizine (XYZAL) 5 MG tablet Reorder     Meds ordered this encounter  Medications   levocetirizine (XYZAL) 5 MG tablet    Sig: Take 1 tablet (5 mg total) by mouth every evening.    Dispense:  90 tablet    Refill:  0    Type 2 diabetes mellitus with other specified complication, without long-term current use of insulin Assessment: Condition is Not optimized..  Lab Results  Component Value Date   HGBA1C 6.3 (H) 01/05/2022   HGBA1C 6.1 (H) 07/14/2021   HGBA1C 6.3 (H) 01/20/2021   INSULIN 25.2 (H) 01/05/2022   INSULIN 18.4 07/14/2021   INSULIN 17.6 01/20/2021  She has been compliant with Metformin 500 mg BID, denies any side effects when eating on plan. She endorses that her hunger and cravings are also controlled when eating on plan. When she does not eat all the food or eats off the plan, her hunger and cravings increase and has slight nausea (may likely be due to Metformin). Her sugar is approximately 100 before breakfast and in the 120s-130s in the afternoon/night  Plan: Continue with med. Continue checking blood sugars at home  Continue her prudent nutritional plan that is low in simple carbohydrates, saturated fats and trans fats to goal of 5-10% weight loss to achieve significant health benefits.  Pt encouraged to continually advance exercise and cardiovascular fitness as tolerated throughout weight loss journey.    Seasonal allergies Assessment: Condition is stable.  Patient endorses that her allergy symptoms have improved on the Xyzal 5 mg daily. Denies any side effects. She is not using  Flonase because her nose has not been bothering her.   Plan: Will  refill Xyzal today. Continue with meds   Vitamin D deficiency Assessment: Condition is stable Lab Results  Component Value Date   VD25OH 59.2 01/05/2022   VD25OH 50.3 07/14/2021   VD25OH 48.9 01/20/2021  Patient reports good compliance and tolerance with OTC Vitamin D 5,000 IU every 3 days. Denies any side effects.  Plan: Continue with med.  - weight loss will likely improve availability of vitamin D, thus encouraged [name] to continue with meal plan and their weight loss efforts to further improve this condition.    Constipation Assessment: Condition is controlled. Patient has been compliant with Miralax -she uses it most days. Denies any side effects.  Plan:Continue with med. - Unless pre-existing renal or cardiopulmonary conditions exist which patient was told to limit their fluid intake by another provider, I recommended roughly one half of their weight in pounds, to be the approximate ounces of non-caloric, non-caffeinated beverages they should drink per day; including more if they are engaging in exercise.    TREATMENT PLAN FOR OBESITY: BMI 35.0-35.9,adult-current 34.9 Morbid obesity (HCC)-start bmi 39.50/date 08/31/2017 Assessment: Condition is improving, but not optimized.Biometric data collected today, was reviewed with patient.  Fat mass has decreased by 1.4lb. Muscle mass has decreased by 0.8lb. Total body water has decreased by 2.4lb.   Plan:  [name] is currently in the action stage of change. As such, her goal is to continue weight management plan. [name] will work on healthier eating habits and try their best  to Continue the Pescatarian .  Behavioral Intervention Additional resources provided today: patient declined Evidence-based interventions for health behavior change were utilized today including the discussion of self monitoring techniques, problem-solving barriers and SMART goal setting techniques.   Regarding patient's less desirable eating habits and patterns, we  employed the technique of small changes.  Pt will specifically work on: increase exercise to 3 days a week. for next visit.    Recommended Physical Activity Goals She has agreed to Think about ways to increase physical activity  FOLLOW UP: Return in about 4 weeks (around 12/03/2022). She was informed of the importance of frequent follow up visits to maximize her success with intensive lifestyle modifications for her multiple health conditions.   Subjective:   Chief complaint: Obesity Molly [name] is here to discuss her progress with her obesity treatment plan. She is on the [employer]escat[employer] Plan and states she is following her eating plan approximately 80% of the time. She states she is doing yoga 45 minutes 2 days per week.  Interval History:  Molly [name] is here for a follow up office visit. Since last office visit she had an ERCP on 10/20/22. Her stomach function is returning back to normal. She is taking Protonix 40 mg daily before eating to decrease the likelihood of belching. Sometimes, she has trouble eating all the food/protein on the meal plan. On those days, she has increased hunger and cravings.  Pharmacotherapy for weight loss: She is currently taking Metformin for medical weight loss.  Denies side effects.    Review of Systems:  Pertinent positives were addressed with patient today.   Weight Summary and Biometrics   Weight Lost Since Last Visit: 3lb  No data recorded   Vitals Temp: 98 F (36.7 C) BP: 126/78 Pulse Rate: 78 SpO2: 98 %   Anthropometric Measurements Height: 5\' 3"  (1.6 m) Weight: 196 lb 12.8 oz (89.3 kg) BMI (Calculated): 34.87 Weight at Last Visit: 199lb Weight Lost Since Last Visit: 3lb Starting Weight: 223lb Total Weight Loss (lbs): 27 lb (12.2 kg) Peak Weight: 230lb   Body Composition  Body Fat %: 44.6 % Fat Mass (lbs): 87.8 lbs Muscle Mass (lbs): 103.4 lbs Total Body Water (lbs): 76.8 lbs Visceral Fat Rating : 15   Other Clinical Data Fasting:  no Labs: no Today's Visit #: 73 Starting Date: 08/31/17     Objective:   PHYSICAL EXAM: Blood pressure 126/78, pulse 78, temperature 98 F (36.7 C), height 5\' 3"  (1.6 m), weight 196 lb 12.8 oz (89.3 kg), SpO2 98 %. Body mass index is 34.86 kg/m.  General: Well Developed, well nourished, and in no acute distress.  HEENT: Normocephalic, atraumatic Skin: Warm and dry, cap RF less 2 sec, good turgor Chest:  Normal excursion, shape, no gross abn Respiratory: speaking in full sentences, no conversational dyspnea NeuroM-Sk: Ambulates w/o assistance, moves * 4 Psych: A and O *3, insight good, mood-full  DIAGNOSTIC DATA REVIEWED:  BMET    Component Value Date/Time   NA 142 01/05/2022 0824   K 3.6 01/05/2022 0824   CL 102 01/05/2022 0824   CO2 27 01/05/2022 0824   GLUCOSE 121 (H) 01/05/2022 0824   GLUCOSE 134 (H) 09/12/2017 2118   BUN 14 01/05/2022 0824   CREATININE 0.94 01/05/2022 0824   CREATININE 0.90 03/23/2016 1116   CALCIUM 10.6 (H) 01/05/2022 0824   GFRNONAA 55 (L) 07/10/2020 0940   GFRAA 63 07/10/2020 0940   Lab Results  Component Value Date   HGBA1C 6.3 (H) 01/05/2022  HGBA1C (H) 08/31/2010    6.0 (NOTE)                                                                       According to the ADA Clinical Practice Recommendations for 2011, when HbA1c is used as a screening test:   >=6.5%   Diagnostic of Diabetes Mellitus           (if abnormal result  is confirmed)  5.7-6.4%   Increased risk of developing Diabetes Mellitus  References:Diagnosis and Classification of Diabetes Mellitus,Diabetes Care,2011,34(Suppl 1):S62-S69 and Standards of Medical Care in         Diabetes - 2011,Diabetes Care,2011,34  (Suppl 1):S11-S61.   Lab Results  Component Value Date   INSULIN 25.2 (H) 01/05/2022   INSULIN 20.4 08/31/2017   Lab Results  Component Value Date   TSH 1.680 08/31/2017   CBC    Component Value Date/Time   WBC 4.4 09/12/2017 2118   RBC 4.72 09/12/2017 2118    HGB 15.0 09/12/2017 2118   HGB 14.0 08/31/2017 1140   HCT 43.7 09/12/2017 2118   HCT 41.2 08/31/2017 1140   PLT 173 09/12/2017 2118   MCV 92.6 09/12/2017 2118   MCV 92 08/31/2017 1140   MCH 31.8 09/12/2017 2118   MCHC 34.3 09/12/2017 2118   RDW 13.1 09/12/2017 2118   RDW 13.7 08/31/2017 1140   Iron Studies No results found for: "IRON", "TIBC", "FERRITIN", "IRONPCTSAT" Lipid Panel     Component Value Date/Time   CHOL 149 07/14/2021 0954   TRIG 79 07/14/2021 0954   HDL 63 07/14/2021 0954   CHOLHDL 2.4 07/14/2021 0954   CHOLHDL 2.6 08/31/2010 0540   VLDL 12 08/31/2010 0540   LDLCALC 71 07/14/2021 0954   Hepatic Function Panel     Component Value Date/Time   PROT 7.5 01/05/2022 0824   ALBUMIN 4.8 (H) 01/05/2022 0824   AST 19 01/05/2022 0824   ALT 19 01/05/2022 0824   ALKPHOS 51 01/05/2022 0824   BILITOT 0.4 01/05/2022 0824      Component Value Date/Time   TSH 1.680 08/31/2017 1140   Nutritional Lab Results  Component Value Date   VD25OH 59.2 01/05/2022   VD25OH 50.3 07/14/2021   VD25OH 48.9 01/20/2021    Attestations:   Reviewed by clinician on day of visit: allergies, medications, problem list, medical history, surgical history, family history, social history, and previous encounter notes.  I,Special Puri,acting as a Neurosurgeon for [employer], DO.,have documented all relevant documentation on the behalf of Thomasene Lot, DO,as directed by  Thomasene Lot, DO while in the presence of Thomasene Lot, DO.   I, Thomasene Lot, DO, have reviewed all documentation for this visit. The documentation on 11/05/22 for the exam, diagnosis, procedures, and orders are all accurate and complete.

## 2022-11-15 ENCOUNTER — Telehealth: Payer: Medicare HMO | Admitting: Physician Assistant

## 2022-11-15 DIAGNOSIS — R42 Dizziness and giddiness: Secondary | ICD-10-CM

## 2022-11-15 MED ORDER — ONDANSETRON HCL 4 MG PO TABS: 4.0000 mg | ORAL_TABLET | Freq: Three times a day (TID) | ORAL | 0 refills | Status: DC | PRN

## 2022-11-15 MED ORDER — MECLIZINE HCL 25 MG PO TABS
25.0000 mg | ORAL_TABLET | Freq: Three times a day (TID) | ORAL | 0 refills | Status: AC | PRN
Start: 2022-11-15 — End: 2022-11-25

## 2022-11-15 NOTE — Progress Notes (Signed)
Virtual Visit Consent   Molly [name], you are scheduled for a virtual visit with a [facility] provider today. Just as with appointments in the office, your consent must be obtained to participate. Your consent will be active for this visit and any virtual visit you may have with one of our providers in the next 365 days. If you have a MyChart account, a copy of this consent can be sent to you electronically.  As this is a virtual visit, video technology does not allow for your provider to perform a traditional examination. This may limit your provider's ability to fully assess your condition. If your provider identifies any concerns that need to be evaluated in person or the need to arrange testing (such as labs, EKG, etc.), we will make arrangements to do so. Although advances in technology are sophisticated, we cannot ensure that it will always work on either your end or our end. If the connection with a video visit is poor, the visit may have to be switched to a telephone visit. With either a video or telephone visit, we are not always able to ensure that we have a secure connection.  By engaging in this virtual visit, you consent to the provision of healthcare and authorize for your insurance to be billed (if applicable) for the services provided during this visit. Depending on your insurance coverage, you may receive a charge related to this service.  I need to obtain your verbal consent now. Are you willing to proceed with your visit today? Molly [name] has provided verbal consent on 11/15/2022 for a virtual visit (video or telephone). Tylene Fantasia Ward, PA-C  Date: 11/15/2022 10:22 AM  Virtual Visit via Video Note   I, Tylene Fantasia Ward, connected with  Molly [name]  ([id number], 08/20/1945) on 11/15/22 at 10:15 AM EDT by a video-enabled telemedicine application and verified that I am speaking with the correct person using two identifiers.  Location: Patient: Virtual Visit Location Patient:  Home Provider: Virtual Visit Location Provider: Home   I discussed the limitations of evaluation and management by telemedicine and the availability of in person appointments. The patient expressed understanding and agreed to proceed.    History of Present Illness: Molly [name] is a 77 y.o. who identifies as a female who was assigned female at birth, and is being seen today for dizziness and nausea.  He has a history of vertigo.  She reports sx started about 3am.  She denies visual changes, headaches, syncope, confusion, speech changes.  No recent head injury.  .  Reports sx are worse with movement and turning her head.  She reports sx feel like her typical vertigo sx.  She reports relief with antivert and zofran.   HPI: HPI  Problems:  Patient Active Problem List   Diagnosis Date Noted   Constipation 11/05/2022   Morbid obesity (HCC)-start bmi 39.50/date 08/31/2017 10/05/2022   Hyperlipidemia associated with type 2 diabetes mellitus 10/05/2022   Seasonal allergies 10/05/2022   Abdominal pain 09/02/2022   BMI 35.0-35.9,adult 09/02/2022   Obesity, Beginning BMI 39.50 09/02/2022   Essential hypertension 06/09/2022   Hypertension associated with type 2 diabetes mellitus 03/05/2022   Class 2 severe obesity with serious comorbidity and body mass index (BMI) of 39.0 to 39.9 in adult 05/09/2018   Other fatigue 08/31/2017   Shortness of breath on exertion 08/31/2017   Diabetes mellitus 08/31/2017   Hypertension associated with diabetes 08/31/2017   Vitamin D deficiency 08/31/2017   Recurrent urticaria 03/23/2016  Angioedema 03/23/2016   Food allergy 03/23/2016   Other allergic rhinitis 03/23/2016    Allergies: No Known Allergies Medications:  Current Outpatient Medications:    meclizine (ANTIVERT) 25 MG tablet, Take 1 tablet (25 mg total) by mouth 3 (three) times daily as needed for up to 10 days for dizziness., Disp: 30 tablet, Rfl: 0   ondansetron (ZOFRAN) 4 MG tablet, Take 1 tablet (4 mg  total) by mouth every 8 (eight) hours as needed for nausea or vomiting., Disp: 20 tablet, Rfl: 0   amLODipine (NORVASC) 10 MG tablet, Take 1 tablet (10 mg total) by mouth daily., Disp: 30 tablet, Rfl: 0   chlorthalidone (HYGROTON) 25 MG tablet, Take 1 tablet (25 mg total) by mouth daily., Disp: 30 tablet, Rfl: 0   Cholecalciferol (VITAMIN D3) 5000 units CAPS, Take 1 capsule by mouth every other day. Takes three times a week., Disp: , Rfl:    co-enzyme Q-10 30 MG capsule, Take 30 mg by mouth 3 (three) times daily., Disp: , Rfl:    fluticasone (FLONASE) 50 MCG/ACT nasal spray, Place 2 sprays into both nostrils daily as needed for allergies or rhinitis., Disp: 16 g, Rfl: 5   glucose blood (ACCU-CHEK ACTIVE STRIPS) test strip, Use as instructed, Disp: 100 each, Rfl: 1   glucose blood test strip, Use as instructed, Disp: 100 each, Rfl: 0   Lancets (ACCU-CHEK SAFE-T PRO) lancets, Use as instructed, Disp: 100 each, Rfl: 1   levocetirizine (XYZAL) 5 MG tablet, Take 1 tablet (5 mg total) by mouth every evening., Disp: 90 tablet, Rfl: 0   losartan (COZAAR) 100 MG tablet, Take 1 tablet (100 mg total) by mouth daily., Disp: 30 tablet, Rfl: 0   metFORMIN (GLUCOPHAGE) 500 MG tablet, Take 500 mg by mouth 2 (two) times daily with a meal., Disp: , Rfl:    Multiple Vitamins-Minerals (MULTIVITAMIN ADULT PO), Take 1 tablet by mouth daily., Disp: , Rfl:    Nutritional Supplements (GLUCOSE MANAGEMENT) TABS, Take one daily as needed, Disp: 30 tablet, Rfl: 0   Omega-3 Fatty Acids (FISH OIL PO), Take 1 tablet by mouth daily., Disp: , Rfl:    polyethylene glycol (MIRALAX / GLYCOLAX) 17 g packet, Take 17 g by mouth 2 (two) times daily. Until stooling regularly, Disp: 60 packet, Rfl: 0   rosuvastatin (CRESTOR) 10 MG tablet, Take 10 mg by mouth daily., Disp: , Rfl:   Observations/Objective: Patient is well-developed, well-nourished in no acute distress.  Resting comfortably at home.  Head is normocephalic, atraumatic.  No  labored breathing.  Speech is clear and coherent with logical content.  Patient is alert and oriented at baseline.    Assessment and Plan: 1. Vertigo - ondansetron (ZOFRAN) 4 MG tablet; Take 1 tablet (4 mg total) by mouth every 8 (eight) hours as needed for nausea or vomiting.  Dispense: 20 tablet; Refill: 0 - meclizine (ANTIVERT) 25 MG tablet; Take 1 tablet (25 mg total) by mouth 3 (three) times daily as needed for up to 10 days for dizziness.  Dispense: 30 tablet; Refill: 0  No concerning neuro complains, reports sx are similar to previous vertigo episodes.  Advised in person evaluation if sx become worse.   Follow Up Instructions: I discussed the assessment and treatment plan with the patient. The patient was provided an opportunity to ask questions and all were answered. The patient agreed with the plan and demonstrated an understanding of the instructions.  A copy of instructions were sent to the patient via MyChart unless  otherwise noted below.     The patient was advised to call back or seek an in-person evaluation if the symptoms worsen or if the condition fails to improve as anticipated.  Time:  I spent 11 minutes with the patient via telehealth technology discussing the above problems/concerns.    Tylene Fantasia Ward, PA-C

## 2022-11-15 NOTE — Patient Instructions (Signed)
Thurmon Fair, thank you for joining Tylene Fantasia Ward, PA-C for today's virtual visit.  While this provider is not your primary care provider (PCP), if your PCP is located in our provider database this encounter information will be shared with them immediately following your visit.   A [facility] MyChart account gives you access to today's visit and all your visits, tests, and labs performed at [facility] " click here if you don't have a [facility] MyChart account or go to [email or web address]  Consent: (Patient) Molly [name] provided verbal consent for this virtual visit at the beginning of the encounter.  Current Medications:  Current Outpatient Medications:    meclizine (ANTIVERT) 25 MG tablet, Take 1 tablet (25 mg total) by mouth 3 (three) times daily as needed for up to 10 days for dizziness., Disp: 30 tablet, Rfl: 0   ondansetron (ZOFRAN) 4 MG tablet, Take 1 tablet (4 mg total) by mouth every 8 (eight) hours as needed for nausea or vomiting., Disp: 20 tablet, Rfl: 0   amLODipine (NORVASC) 10 MG tablet, Take 1 tablet (10 mg total) by mouth daily., Disp: 30 tablet, Rfl: 0   chlorthalidone (HYGROTON) 25 MG tablet, Take 1 tablet (25 mg total) by mouth daily., Disp: 30 tablet, Rfl: 0   Cholecalciferol (VITAMIN D3) 5000 units CAPS, Take 1 capsule by mouth every other day. Takes three times a week., Disp: , Rfl:    co-enzyme Q-10 30 MG capsule, Take 30 mg by mouth 3 (three) times daily., Disp: , Rfl:    fluticasone (FLONASE) 50 MCG/ACT nasal spray, Place 2 sprays into both nostrils daily as needed for allergies or rhinitis., Disp: 16 g, Rfl: 5   glucose blood (ACCU-CHEK ACTIVE STRIPS) test strip, Use as instructed, Disp: 100 each, Rfl: 1   glucose blood test strip, Use as instructed, Disp: 100 each, Rfl: 0   Lancets (ACCU-CHEK SAFE-T PRO) lancets, Use as instructed, Disp: 100 each, Rfl: 1   levocetirizine (XYZAL) 5 MG tablet, Take 1 tablet (5 mg total) by mouth every evening.,  Disp: 90 tablet, Rfl: 0   losartan (COZAAR) 100 MG tablet, Take 1 tablet (100 mg total) by mouth daily., Disp: 30 tablet, Rfl: 0   metFORMIN (GLUCOPHAGE) 500 MG tablet, Take 500 mg by mouth 2 (two) times daily with a meal., Disp: , Rfl:    Multiple Vitamins-Minerals (MULTIVITAMIN ADULT PO), Take 1 tablet by mouth daily., Disp: , Rfl:    Nutritional Supplements (GLUCOSE MANAGEMENT) TABS, Take one daily as needed, Disp: 30 tablet, Rfl: 0   Omega-3 Fatty Acids (FISH OIL PO), Take 1 tablet by mouth daily., Disp: , Rfl:    polyethylene glycol (MIRALAX / GLYCOLAX) 17 g packet, Take 17 g by mouth 2 (two) times daily. Until stooling regularly, Disp: 60 packet, Rfl: 0   rosuvastatin (CRESTOR) 10 MG tablet, Take 10 mg by mouth daily., Disp: , Rfl:    Medications ordered in this encounter:  Meds ordered this encounter  Medications   ondansetron (ZOFRAN) 4 MG tablet    Sig: Take 1 tablet (4 mg total) by mouth every 8 (eight) hours as needed for nausea or vomiting.    Dispense:  20 tablet    Refill:  0    Order Specific Question:   Supervising Provider    Answer:   Merrilee Jansky [[id number]]   meclizine (ANTIVERT) 25 MG tablet    Sig: Take 1 tablet (25 mg total) by mouth 3 (three) times daily as needed for up  to 10 days for dizziness.    Dispense:  30 tablet    Refill:  0    Order Specific Question:   Supervising Provider    Answer:   Merrilee Jansky [id number]     *If you need refills on other medications prior to your next appointment, please contact your pharmacy*  Follow-Up: Call back or seek an in-person evaluation if the symptoms worsen or if the condition fails to improve as anticipated.  [facility] [phone]  Other Instructions Take antivert as needed every 8 hours.  Can take zofran as needed for nausea.  If you develop worsening or more concerning symptoms go to in person evaluation in Urgent Care or with your PCP.    If you have been instructed to have an  in-person evaluation today at a local Urgent Care facility, please use the link below. It will take you to a list of all of our available [facility], including address, phone number and hours of operation. Please do not delay care.  [facility]  If you or a family member do not have a primary care provider, use the link below to schedule a visit and establish care. When you choose a [facility] primary care physician or advanced practice provider, you gain a long-term partner in health. Find a Primary Care Provider  Learn more about [facility]'s in-office and virtual care options: [facility] - Get Care Now

## 2022-12-07 ENCOUNTER — Ambulatory Visit (INDEPENDENT_AMBULATORY_CARE_PROVIDER_SITE_OTHER): Payer: Medicare PPO | Admitting: Family Medicine

## 2022-12-07 ENCOUNTER — Encounter (INDEPENDENT_AMBULATORY_CARE_PROVIDER_SITE_OTHER): Payer: Self-pay | Admitting: Family Medicine

## 2022-12-07 VITALS — BP 132/77 | HR 98 | Temp 97.9°F | Ht 63.0 in | Wt 197.0 lb

## 2022-12-07 DIAGNOSIS — E1169 Type 2 diabetes mellitus with other specified complication: Secondary | ICD-10-CM | POA: Diagnosis not present

## 2022-12-07 DIAGNOSIS — Z6835 Body mass index (BMI) 35.0-35.9, adult: Secondary | ICD-10-CM

## 2022-12-07 DIAGNOSIS — E559 Vitamin D deficiency, unspecified: Secondary | ICD-10-CM | POA: Diagnosis not present

## 2022-12-07 DIAGNOSIS — Z6834 Body mass index (BMI) 34.0-34.9, adult: Secondary | ICD-10-CM

## 2022-12-07 DIAGNOSIS — Z7984 Long term (current) use of oral hypoglycemic drugs: Secondary | ICD-10-CM

## 2022-12-07 DIAGNOSIS — E669 Obesity, unspecified: Secondary | ICD-10-CM

## 2022-12-07 MED ORDER — METFORMIN HCL 500 MG PO TABS
ORAL_TABLET | ORAL | Status: AC
Start: 2022-12-07 — End: ?

## 2022-12-07 NOTE — Progress Notes (Signed)
Molly [name], D.O.  ABFM, ABOM Specializing in Clinical Bariatric Medicine  Office located at: [address] [place], [place]  [address]     Assessment and Plan:    Medications Discontinued During This Encounter  Medication Reason   metFORMIN (GLUCOPHAGE) 500 MG tablet Reorder     Meds ordered this encounter  Medications   metFORMIN (GLUCOPHAGE) 500 MG tablet    Sig: 1 po with lunch and 1 po with dinner    Type 2 diabetes mellitus with other specified complication, without long-term current use of insulin (HCC) Assessment: Condition is not optimized. Labs from PCP were reviewed. Lab Results  Component Value Date   HGBA1C 6.3 (H) 01/05/2022   HGBA1C 6.1 (H) 07/14/2021   HGBA1C 6.3 (H) 01/20/2021   INSULIN 25.2 (H) 01/05/2022   INSULIN 18.4 07/14/2021   INSULIN 17.6 01/20/2021  - On 09/18/22: Her A1c was 6.1 and CMP showed a serum creatine of  0.96.  - No issues with Metformin 500 mg BID, denies any side effects. - In the mornings her sugars are in the 100s.  - She endorses that she has hunger and cravings after lunch and dinner when she does not eat enough protein.   Plan: - Continue with med. Advised patient to take 1 tab at lunch and 1 tab at dinner. Will refill this today.   - Continue decreasing simple carbs, increasing proteins and eating on a regular basis- no skipping or going long periods without eating.     - Recommend that any concerns about medicines should be directed at the prescribing provider    Vitamin D deficiency Assessment: Condition is at goal.  Labs from PCP reviewed. Lab Results  Component Value Date   VD25OH 59.2 01/05/2022   VD25OH 50.3 07/14/2021   VD25OH 48.9 01/20/2021  - On 09/18/22, Her Vitamin D levels were 65 and at goal.  - No issues with OTC Vitamin D3 5,000 Units every other day.   Plan: - Continue with OTC med.  - Will continue to monitor levels regularly (every 3-4 mo on average) to keep levels within normal limits  and prevent over supplementation.    TREATMENT PLAN FOR OBESITY: BMI 35.0-35.9,adult-current 34.9 Morbid obesity (HCC)-start bmi 39.50/date 08/31/2017 Assessment:  Molly [name] is here to discuss her progress with her obesity treatment plan along with follow-up of her obesity related diagnoses. See Medical Weight Management Flowsheet for complete bioelectrical impedance results.  Condition is not optimized. Biometric data collected today, was reviewed with patient.   Since last office visit on 11/05/22 patient's Fat mass has increased by 1.6 lb. Muscle mass has decreased by 1 lb. Total body water has increased by 3lb.  Counseling done on how various foods will affect these numbers and how to maximize success  Total lbs lost to date: 26 Total weight loss percentage to date: 11.66  Plan:  - Berda will work on healthier eating habits and continue the [employer] - I again reviewed the Pescatarian Plan with the patient and explained how she can meet her daily protein goals.   Behavioral Intervention Additional resources provided today: breakfast options, lunch options, and Pescatarian Plan Evidence-based interventions for health behavior change were utilized today including the discussion of self monitoring techniques, problem-solving barriers and SMART goal setting techniques.   Regarding patient's less desirable eating habits and patterns, we employed the technique of small changes.  Pt will specifically work on: measuring and eating all the protein quantities on the meal  plan for next visit.    Recommended Physical Activity Goals  Imagine has been advised to slowly work up to 150 minutes of moderate intensity aerobic activity a week and strengthening exercises 2-3 times per week for cardiovascular health, weight loss maintenance and preservation of muscle mass.   She has agreed to Continue current level of physical activity    FOLLOW UP: Return in about 3 weeks (around 12/28/2022), or 3-4  weeks. She was informed of the importance of frequent follow up visits to maximize her success with intensive lifestyle modifications for her multiple health conditions.  Subjective:   Chief complaint: Obesity Molly [name] is here to discuss her progress with her obesity treatment plan. She is on the the [facility] and states she is following her eating plan approximately 75% of the time. She states she is not exercising.  Interval History:  Molly [name] is here for a follow up office visit.     Since last office visit:   - She had Vertigo and was not able to exercise 3 days a week (our goal for last OV) - On the days she had Vertigo, she endorses also not eating a lot of food.  - She endorses that she has hunger and cravings after lunch and dinner when she does not eat enough protein.  - When eating on plan, her hunger and cravings are well controlled.     Pharmacotherapy for weight loss: She is currently taking  Metformin  for medical weight loss.  Denies side effects.    Review of Systems:  Pertinent positives were addressed with patient today.  Weight Summary and Biometrics   Weight Lost Since Last Visit: 0  Weight Gained Since Last Visit: 1 lb    Vitals Temp: 97.9 F (36.6 C) BP: 132/77 Pulse Rate: 98 SpO2: 95 %   Anthropometric Measurements Height: 5\' 3"  (1.6 m) Weight: 197 lb (89.4 kg) BMI (Calculated): 34.91 Weight at Last Visit: 196 lb Weight Lost Since Last Visit: 0 Weight Gained Since Last Visit: 1 lb Starting Weight: 223 lb Total Weight Loss (lbs): 26 lb (11.8 kg) Peak Weight: 230 lb   Body Composition  Body Fat %: 45.3 % Fat Mass (lbs): 89.4 lbs Muscle Mass (lbs): 102.4 lbs Total Body Water (lbs): 79.8 lbs Visceral Fat Rating : 15   Other Clinical Data Fasting: No Labs: No Today's Visit #: 74 Starting Date: 08/31/17   Objective:   PHYSICAL EXAM: Blood pressure 132/77, pulse 98, temperature 97.9 F (36.6 C), height 5\' 3"  (1.6 m), weight 197  lb (89.4 kg), SpO2 95 %. Body mass index is 34.9 kg/m.  General: Well Developed, well nourished, and in no acute distress.  HEENT: Normocephalic, atraumatic Skin: Warm and dry, cap RF less 2 sec, good turgor Chest:  Normal excursion, shape, no gross abn Respiratory: speaking in full sentences, no conversational dyspnea NeuroM-Sk: Ambulates w/o assistance, moves * 4 Psych: A and O *3, insight good, mood-full  DIAGNOSTIC DATA REVIEWED:  BMET    Component Value Date/Time   NA 142 01/05/2022 0824   K 3.6 01/05/2022 0824   CL 102 01/05/2022 0824   CO2 27 01/05/2022 0824   GLUCOSE 121 (H) 01/05/2022 0824   GLUCOSE 134 (H) 09/12/2017 2118   BUN 14 01/05/2022 0824   CREATININE 0.94 01/05/2022 0824   CREATININE 0.90 03/23/2016 1116   CALCIUM 10.6 (H) 01/05/2022 0824   GFRNONAA 55 (L) 07/10/2020 0940   GFRAA 63 07/10/2020 0940   Lab Results  Component  Value Date   HGBA1C 6.3 (H) 01/05/2022   HGBA1C (H) 08/31/2010    6.0 (NOTE)                                                                       According to the ADA Clinical Practice Recommendations for 2011, when HbA1c is used as a screening test:   >=6.5%   Diagnostic of Diabetes Mellitus           (if abnormal result  is confirmed)  5.7-6.4%   Increased risk of developing Diabetes Mellitus  References:Diagnosis and Classification of Diabetes Mellitus,Diabetes Care,2011,34(Suppl 1):S62-S69 and Standards of Medical Care in         Diabetes - 2011,Diabetes Care,2011,34  (Suppl 1):S11-S61.   Lab Results  Component Value Date   INSULIN 25.2 (H) 01/05/2022   INSULIN 20.4 08/31/2017   Lab Results  Component Value Date   TSH 1.680 08/31/2017   CBC    Component Value Date/Time   WBC 4.4 09/12/2017 2118   RBC 4.72 09/12/2017 2118   HGB 15.0 09/12/2017 2118   HGB 14.0 08/31/2017 1140   HCT 43.7 09/12/2017 2118   HCT 41.2 08/31/2017 1140   PLT 173 09/12/2017 2118   MCV 92.6 09/12/2017 2118   MCV 92 08/31/2017 1140   MCH 31.8  09/12/2017 2118   MCHC 34.3 09/12/2017 2118   RDW 13.1 09/12/2017 2118   RDW 13.7 08/31/2017 1140   Iron Studies No results found for: "IRON", "TIBC", "FERRITIN", "IRONPCTSAT" Lipid Panel     Component Value Date/Time   CHOL 149 07/14/2021 0954   TRIG 79 07/14/2021 0954   HDL 63 07/14/2021 0954   CHOLHDL 2.4 07/14/2021 0954   CHOLHDL 2.6 08/31/2010 0540   VLDL 12 08/31/2010 0540   LDLCALC 71 07/14/2021 0954   Hepatic Function Panel     Component Value Date/Time   PROT 7.5 01/05/2022 0824   ALBUMIN 4.8 (H) 01/05/2022 0824   AST 19 01/05/2022 0824   ALT 19 01/05/2022 0824   ALKPHOS 51 01/05/2022 0824   BILITOT 0.4 01/05/2022 0824      Component Value Date/Time   TSH 1.680 08/31/2017 1140   Nutritional Lab Results  Component Value Date   VD25OH 59.2 01/05/2022   VD25OH 50.3 07/14/2021   VD25OH 48.9 01/20/2021    Attestations:   Reviewed by clinician on day of visit: allergies, medications, problem list, medical history, surgical history, family history, social history, and previous encounter notes.  I,Special Puri,acting as a Neurosurgeon for [employer], DO.,have documented all relevant documentation on the behalf of Thomasene Lot, DO,as directed by  Thomasene Lot, DO while in the presence of Thomasene Lot, DO.   I, Thomasene Lot, DO, have reviewed all documentation for this visit. The documentation on 12/07/22 for the exam, diagnosis, procedures, and orders are all accurate and complete.

## 2023-01-07 ENCOUNTER — Ambulatory Visit (INDEPENDENT_AMBULATORY_CARE_PROVIDER_SITE_OTHER): Payer: Medicare PPO | Admitting: Family Medicine

## 2023-01-07 ENCOUNTER — Encounter (INDEPENDENT_AMBULATORY_CARE_PROVIDER_SITE_OTHER): Payer: Self-pay | Admitting: Family Medicine

## 2023-01-07 VITALS — BP 138/79 | HR 92 | Temp 97.9°F | Ht 63.0 in | Wt 189.0 lb

## 2023-01-07 DIAGNOSIS — Z6833 Body mass index (BMI) 33.0-33.9, adult: Secondary | ICD-10-CM

## 2023-01-07 DIAGNOSIS — E1169 Type 2 diabetes mellitus with other specified complication: Secondary | ICD-10-CM

## 2023-01-07 DIAGNOSIS — Z7985 Long-term (current) use of injectable non-insulin antidiabetic drugs: Secondary | ICD-10-CM

## 2023-01-07 DIAGNOSIS — E559 Vitamin D deficiency, unspecified: Secondary | ICD-10-CM | POA: Diagnosis not present

## 2023-01-07 DIAGNOSIS — E785 Hyperlipidemia, unspecified: Secondary | ICD-10-CM

## 2023-01-07 DIAGNOSIS — Z6835 Body mass index (BMI) 35.0-35.9, adult: Secondary | ICD-10-CM

## 2023-01-07 NOTE — Progress Notes (Signed)
Molly [name], D.O.  ABFM, ABOM Specializing in Clinical Bariatric Medicine  Office located at: [address] [place], [place]  [address]     Assessment and Plan:   Medications Discontinued During This Encounter  Medication Reason   Cholecalciferol (VITAMIN D3) 5000 units CAPS    Cholecalciferol (VITAMIN D3) 5000 units CAPS       Type 2 diabetes mellitus with obesity (HCC) Assessment: Condition is worsening. Molly [name] requested that I go over labs from 01/01/23 with PCP. Her A1c worsened to 6.6. I reminded patient that following her meal plan more closely and exercising is likely to decrease her A1c levels. She endorses tolerating the Metformin 500 mg BID well and that it is helping with her hunger and cravings.   Lab Results  Component Value Date   HGBA1C 6.3 (H) 01/05/2022   HGBA1C 6.1 (H) 07/14/2021   HGBA1C 6.3 (H) 01/20/2021   INSULIN 25.2 (H) 01/05/2022   INSULIN 18.4 07/14/2021   INSULIN 17.6 01/20/2021    Plan: Continue with Metformin as prescribed.   Continue her prudent nutritional plan that is low in simple carbohydrates, saturated fats and trans fats to goal of 5-10% weight loss to achieve significant health benefits.  Pt encouraged to continually advance exercise and cardiovascular fitness as tolerated throughout weight loss journey. Will continue to monitor condition closely alongside PCP.     Hyperlipidemia associated with type 2 diabetes mellitus (HCC) Assessment: Condition is diet controlled and not optimized. Molly [name] requested that I review labs from PCP on 01/01/23. Her LDL was 76 and I discussed with her that ideal LDL levels are < 70. Her HDL was 43 and at goal. The other components of her cholesterol were also with the recommended limits.   Lab Results  Component Value Date   CHOL 149 07/14/2021   HDL 63 07/14/2021   LDLCALC 71 07/14/2021   TRIG 79 07/14/2021   CHOLHDL 2.4 07/14/2021    Plan: She agrees to continue with our treatment plan of a  heart-heathy, low cholesterol meal plan.  I again stressed the importance that patient continue with our prudent nutritional plan that is low in saturated and trans fats, and low in fatty carbs to improve these numbers. I again recommend: aerobic activity with eventual goal of a minimum of 150+ min wk plus 2 days/ week of resistance or strength training. We will continue routine screening as patient continues to achieve health goals along their weight loss journey      Vitamin D deficiency Assessment: Molly [name] requested that I review labs obtained with PCP of [facility] on 01/01/2023. Her Vitamin D levels were 82.3 and slightly above the recommended range of 50-80. No concerns. Molly [name] went off OTC Vitamin D 5,000 Units on 01/07/23 per her PCP.   Lab Results  Component Value Date   VD25OH 59.2 01/05/2022   VD25OH 50.3 07/14/2021   VD25OH 48.9 01/20/2021   Plan: Will recheck her Vitamin D levels around early September. Weight loss will likely improve availability of vitamin D, thus encouraged Molly [name] to continue with meal plan and their weight loss efforts to further improve this condition. pt's questions and concerns regarding this condition addressed.     TREATMENT PLAN FOR OBESITY: BMI 35.0-35.9,adult-current 33.49 Morbid obesity (HCC)-start bmi 39.50/date 08/31/2017 Assessment: Molly [name] is here to discuss her progress with her obesity treatment plan along with follow-up of her obesity related diagnoses. See Medical Weight Management Flowsheet for complete bioelectrical impedance results.  Condition is improving.  Biometric data collected today, was reviewed with patient.   Since last office visit on 12/07/22 patient's  Muscle mass has increased by 9 lb. Fat mass has decreased by 17 lb. Total body water has decreased by 4.4 lb.  Counseling done on how various foods will affect these numbers and how to maximize success  Total lbs lost to date: 34 Total weight loss percentage to date: 2.21   Plan:  Continue the [place] Plan  Behavioral Intervention Additional resources provided today: patient declined Evidence-based interventions for health behavior change were utilized today including the discussion of self monitoring techniques, problem-solving barriers and SMART goal setting techniques.   Regarding patient's less desirable eating habits and patterns, we employed the technique of small changes.  Pt will specifically work on: continue with prescribed meal plan and focusing on protein intake for next visit.    Recommended Physical Activity Goals  Molly [name] has been advised to slowly work up to 150 minutes of moderate intensity aerobic activity a week and strengthening exercises 2-3 times per week for cardiovascular health, weight loss maintenance and preservation of muscle mass.   She has agreed to Think about ways to increase daily physical activity and overcoming barriers to exercise  FOLLOW UP: Return in 4-5 wks. She was informed of the importance of frequent follow up visits to maximize her success with intensive lifestyle modifications for her multiple health conditions.  Subjective:   Chief complaint: Obesity Molly [name] is here to discuss her progress with her obesity treatment plan. She is on the the [facility] and states she is following her eating plan approximately 75% of the time. She states she is not exercising.   Interval History:  Molly [name] is here for a follow up office visit.     Since last office visit:    - Molly [name] has been stressed due to other health concerns. On 12/22/22, she had gall bladder surgery.   - Due to these health problems, she has not been following the meal plan as closely.   - She expresses interest in getting back on track with her prescribed meal plan.  Pharmacotherapy for weight loss: She is currently taking  Metformin  for medical weight loss.  Denies side effects.    Review of Systems:  Pertinent positives were addressed with patient  today.  Reviewed by clinician on day of visit: allergies, medications, problem list, medical history, surgical history, family history, social history, and previous encounter notes.  Weight Summary and Biometrics   Weight Lost Since Last Visit: 8  Weight Gained Since Last Visit: 0lb    Vitals Temp: 97.9 F (36.6 C) BP: 138/79 Pulse Rate: 92 SpO2: 97 %   Anthropometric Measurements Height: 5\' 3"  (1.6 m) Weight: 189 lb (85.7 kg) BMI (Calculated): 33.49 Weight at Last Visit: 197lb Weight Lost Since Last Visit: 8 Weight Gained Since Last Visit: 0lb Starting Weight: 223lb Total Weight Loss (lbs): 34 lb (15.4 kg) Peak Weight: 230lb   Body Composition  Body Fat %: 38.1 % Fat Mass (lbs): 72.4 lbs Muscle Mass (lbs): 111.4 lbs Total Body Water (lbs): 75.4 lbs Visceral Fat Rating : 13   Other Clinical Data Fasting: no Labs: no Today's Visit #: 75 Starting Date: 08/31/17   Objective:   PHYSICAL EXAM: Blood pressure 138/79, pulse 92, temperature 97.9 F (36.6 C), height 5\' 3"  (1.6 m), weight 189 lb (85.7 kg), SpO2 97 %. Body mass index is 33.48 kg/m.  General: Well Developed, well nourished, and in no acute distress.  HEENT: Normocephalic, atraumatic Skin: Warm and dry, cap RF less 2 sec, good turgor Chest:  Normal excursion, shape, no gross abn Respiratory: speaking in full sentences, no conversational dyspnea NeuroM-Sk: Ambulates w/o assistance, moves * 4 Psych: A and O *3, insight good, mood-full  DIAGNOSTIC DATA REVIEWED:  BMET    Component Value Date/Time   NA 142 01/05/2022 0824   K 3.6 01/05/2022 0824   CL 102 01/05/2022 0824   CO2 27 01/05/2022 0824   GLUCOSE 121 (H) 01/05/2022 0824   GLUCOSE 134 (H) 09/12/2017 2118   BUN 14 01/05/2022 0824   CREATININE 0.94 01/05/2022 0824   CREATININE 0.90 03/23/2016 1116   CALCIUM 10.6 (H) 01/05/2022 0824   GFRNONAA 55 (L) 07/10/2020 0940   GFRAA 63 07/10/2020 0940   Lab Results  Component Value Date    HGBA1C 6.3 (H) 01/05/2022   HGBA1C (H) 08/31/2010    6.0 (NOTE)                                                                       According to the ADA Clinical Practice Recommendations for 2011, when HbA1c is used as a screening test:   >=6.5%   Diagnostic of Diabetes Mellitus           (if abnormal result  is confirmed)  5.7-6.4%   Increased risk of developing Diabetes Mellitus  References:Diagnosis and Classification of Diabetes Mellitus,Diabetes Care,2011,34(Suppl 1):S62-S69 and Standards of Medical Care in         Diabetes - 2011,Diabetes Care,2011,34  (Suppl 1):S11-S61.   Lab Results  Component Value Date   INSULIN 25.2 (H) 01/05/2022   INSULIN 20.4 08/31/2017   Lab Results  Component Value Date   TSH 1.680 08/31/2017   CBC    Component Value Date/Time   WBC 4.4 09/12/2017 2118   RBC 4.72 09/12/2017 2118   HGB 15.0 09/12/2017 2118   HGB 14.0 08/31/2017 1140   HCT 43.7 09/12/2017 2118   HCT 41.2 08/31/2017 1140   PLT 173 09/12/2017 2118   MCV 92.6 09/12/2017 2118   MCV 92 08/31/2017 1140   MCH 31.8 09/12/2017 2118   MCHC 34.3 09/12/2017 2118   RDW 13.1 09/12/2017 2118   RDW 13.7 08/31/2017 1140   Iron Studies No results found for: "IRON", "TIBC", "FERRITIN", "IRONPCTSAT" Lipid Panel     Component Value Date/Time   CHOL 149 07/14/2021 0954   TRIG 79 07/14/2021 0954   HDL 63 07/14/2021 0954   CHOLHDL 2.4 07/14/2021 0954   CHOLHDL 2.6 08/31/2010 0540   VLDL 12 08/31/2010 0540   LDLCALC 71 07/14/2021 0954   Hepatic Function Panel     Component Value Date/Time   PROT 7.5 01/05/2022 0824   ALBUMIN 4.8 (H) 01/05/2022 0824   AST 19 01/05/2022 0824   ALT 19 01/05/2022 0824   ALKPHOS 51 01/05/2022 0824   BILITOT 0.4 01/05/2022 0824      Component Value Date/Time   TSH 1.680 08/31/2017 1140   Nutritional Lab Results  Component Value Date   VD25OH 59.2 01/05/2022   VD25OH 50.3 07/14/2021   VD25OH 48.9 01/20/2021    Attestations:   I, Special Puri,  acting as a Stage manager for [employer], DO., have compiled  all relevant documentation for today's office visit on behalf of Thomasene Lot, DO, while in the presence of [employer], DO.  I have reviewed the above documentation for accuracy and completeness, and I agree with the above. Molly [name], D.O.  The 21st Century Cures Act was signed into law in 2016 which includes the topic of electronic health records.  This provides immediate access to information in MyChart.  This includes consultation notes, operative notes, office notes, lab results and pathology reports.  If you have any questions about what you read please let us know at your next visit so we can discuss your concerns and take corrective action if need be.  We are right here with you.

## 2023-02-01 ENCOUNTER — Other Ambulatory Visit (INDEPENDENT_AMBULATORY_CARE_PROVIDER_SITE_OTHER): Payer: Self-pay | Admitting: Family Medicine

## 2023-02-01 DIAGNOSIS — J302 Other seasonal allergic rhinitis: Secondary | ICD-10-CM

## 2023-02-03 DIAGNOSIS — Z780 Asymptomatic menopausal state: Secondary | ICD-10-CM | POA: Diagnosis not present

## 2023-02-11 ENCOUNTER — Encounter (INDEPENDENT_AMBULATORY_CARE_PROVIDER_SITE_OTHER): Payer: Self-pay | Admitting: Family Medicine

## 2023-02-11 ENCOUNTER — Ambulatory Visit (INDEPENDENT_AMBULATORY_CARE_PROVIDER_SITE_OTHER): Payer: Medicare PPO | Admitting: Family Medicine

## 2023-02-11 VITALS — BP 113/72 | HR 91 | Temp 98.5°F | Ht 63.0 in | Wt 194.0 lb

## 2023-02-11 DIAGNOSIS — Z6834 Body mass index (BMI) 34.0-34.9, adult: Secondary | ICD-10-CM | POA: Diagnosis not present

## 2023-02-11 DIAGNOSIS — E669 Obesity, unspecified: Secondary | ICD-10-CM

## 2023-02-11 DIAGNOSIS — Z7984 Long term (current) use of oral hypoglycemic drugs: Secondary | ICD-10-CM | POA: Diagnosis not present

## 2023-02-11 DIAGNOSIS — I152 Hypertension secondary to endocrine disorders: Secondary | ICD-10-CM | POA: Diagnosis not present

## 2023-02-11 DIAGNOSIS — E1169 Type 2 diabetes mellitus with other specified complication: Secondary | ICD-10-CM

## 2023-02-11 DIAGNOSIS — Z1231 Encounter for screening mammogram for malignant neoplasm of breast: Secondary | ICD-10-CM | POA: Diagnosis not present

## 2023-02-11 DIAGNOSIS — K8050 Calculus of bile duct without cholangitis or cholecystitis without obstruction: Secondary | ICD-10-CM

## 2023-02-11 DIAGNOSIS — Z6835 Body mass index (BMI) 35.0-35.9, adult: Secondary | ICD-10-CM

## 2023-02-11 DIAGNOSIS — E1159 Type 2 diabetes mellitus with other circulatory complications: Secondary | ICD-10-CM | POA: Diagnosis not present

## 2023-02-11 NOTE — Progress Notes (Signed)
Molly [name], D.O.  ABFM, ABOM Specializing in Clinical Bariatric Medicine  Office located at: [address] [place], [place]  [address]     Assessment and Plan:    Type 2 diabetes mellitus with obesity (HCC) Assessment & Plan:  Condition is being treated with Metformin 500 mg BID - tolerating supplement well, denies any N/V/D. Reports that her fasting blood sugars are in the 110s-120s. Her blood sugars after eating are about 20-30 points higher. Denies having any lows. Hunger and cravings are controlled when following her prescribed meal plan.  Lab Results  Component Value Date   HGBA1C 6.3 (H) 01/05/2022   HGBA1C 6.1 (H) 07/14/2021   HGBA1C 6.3 (H) 01/20/2021   INSULIN 25.2 (H) 01/05/2022   INSULIN 18.4 07/14/2021   INSULIN 17.6 01/20/2021    Continue with Metformin at current dose. Continue her prudent nutritional plan that is low in simple carbohydrates, saturated fats and trans fats to goal of 5-10% weight loss to achieve significant health benefits.  Pt encouraged to continually advance exercise and cardiovascular fitness as tolerated throughout weight loss journey. Will continue to monitor condition alongside PCP/specialists as it relates to their weight loss journey    Hypertension associated with type 2 diabetes mellitus (HCC) Assessment & Plan :  Her blood pressure is stable today. No concerns in this regard today. Pt endorses that her specialist stopped chlorthalidone and potassium and switched to Dyazide on 01/01/2023. Her HTN is currently being treated with Dyazide 37.5-25 mg daily, Norvasc 10 mg daily, and Cozaar 100 mg daily.   Last 3 blood pressure readings in our office are as follows: BP Readings from Last 3 Encounters:  02/11/23 113/72  01/07/23 138/79  12/07/22 132/77   Continue with all antihypertensive medications as recommended by specialist. Ambulatory blood pressure monitoring encouraged.  Reminded patient that if they ever feel poorly in any  way, to check their blood pressure and pulse as well We will continue to monitor closely alongside PCP/ specialists.  Pt reminded to also f/up with those individuals as instructed by them. We will continue to monitor symptoms as they relate to the her weight loss journey.   Choledocholithiasis Assessment &Plan: Molly [name] has a history of choledocholithiases and sees [facility]. Recently, she endorses having right sided abdominal pressure/discomfort. Reports that this pressure is worsened with certain movements, when laying in bed, and with certain foods (greasy and oily kinds). Reports that belching relieves some of her pressure/discomfort.  Pt states she'll be meeting her GI doctor in the beginning of August to discuss this issue. I stressed the importance that patient avoid fatty carbs and foods high in saturated and transfats. Will continue to monitor condition alongside specialist as it relates to their weight loss journey    TREATMENT PLAN FOR OBESITY: BMI 35.0-35.9,adult-current 34.37 Morbid obesity (HCC)-start bmi 39.50/date 08/31/2017 Assessment: Molly [name] is here to discuss her progress with her obesity treatment plan along with follow-up of her obesity related diagnoses. See Medical Weight Management Flowsheet for complete bioelectrical impedance results.  Condition is not optimized.  Biometric data collected today, was reviewed with patient.   Since last office visit on 01/07/23 patient's muscle mass has decreased by 6.6 lb. Fat mass has increased by 11.6 lb. Total body water has increased by 2.2 lb.  Counseling done on how various foods will affect these numbers and how to maximize success  Total lbs lost to date: - 29 lbs Total weight loss percentage to date: 13%  Plan: Continue the [place] Plan   Behavioral Intervention Additional resources provided today: patient declined Evidence-based interventions for health behavior change were utilized today including the discussion  of self monitoring techniques, problem-solving barriers and SMART goal setting techniques.   Regarding patient's less desirable eating habits and patterns, we employed the technique of small changes.  Pt will specifically work on: doing some form of exercise 5 days a wk  for next visit.    Recommended Physical Activity Goals  Tyreisha has been advised to slowly work up to 150 minutes of moderate intensity aerobic activity a week and strengthening exercises 2-3 times per week for cardiovascular health, weight loss maintenance and preservation of muscle mass.   She has agreed to Continue current level of physical activity   FOLLOW UP: Return in 4-5 wks. She was informed of the importance of frequent follow up visits to maximize her success with intensive lifestyle modifications for her multiple health conditions.  Subjective:   Chief complaint: Obesity Molly [name] is here to discuss her progress with her obesity treatment plan. She is on the [employer] and states she is following her eating plan approximately 75-80% of the time. She states she is walking 15-20 minutes 2 days per week.  Interval History:  Molly [name] is here for a follow up office visit. Since last OV, Molly [name] endorses eating outside the home more frequently. Her hunger and cravings are better controlled when following her prudent nutritional plan. She also informed me that she's feeling low in energy and experiencing some GI discomfort.   Pharmacotherapy for weight loss: She is currently taking  Metformin 500 mg twice weekly  for medical weight loss.  Denies side effects.    Review of Systems:  Pertinent positives were addressed with patient today.  Reviewed by clinician on day of visit: allergies, medications, problem list, medical history, surgical history, family history, social history, and previous encounter notes.  Weight Summary and Biometrics   Weight Lost Since Last Visit: 0lb  Weight Gained Since Last Visit: 5lb     Vitals Temp: 98.5 F (36.9 C) BP: 113/72 Pulse Rate: 91 SpO2: 98 %   Anthropometric Measurements Height: 5\' 3"  (1.6 m) Weight: 194 lb (88 kg) BMI (Calculated): 34.37 Weight at Last Visit: 189lb Weight Lost Since Last Visit: 0lb Weight Gained Since Last Visit: 5lb Starting Weight: 223lb Total Weight Loss (lbs): 29 lb (13.2 kg) Peak Weight: 230lb   Body Composition  Body Fat %: 43.2 % Fat Mass (lbs): 84 lbs Muscle Mass (lbs): 104.8 lbs Total Body Water (lbs): 77.6 lbs Visceral Fat Rating : 14   Other Clinical Data Fasting: no Labs: no Today's Visit #: 76 Starting Date: 08/31/17   Objective:   PHYSICAL EXAM: Blood pressure 113/72, pulse 91, temperature 98.5 F (36.9 C), height 5\' 3"  (1.6 m), weight 194 lb (88 kg), SpO2 98%. Body mass index is 34.37 kg/m.  General: Well Developed, well nourished, and in no acute distress.  HEENT: Normocephalic, atraumatic Skin: Warm and dry, cap RF less 2 sec, good turgor Chest:  Normal excursion, shape, no gross abn Respiratory: speaking in full sentences, no conversational dyspnea NeuroM-Sk: Ambulates w/o assistance, moves * 4 Psych: A and O *3, insight good, mood-full  DIAGNOSTIC DATA REVIEWED:  BMET    Component Value Date/Time   NA 142 01/05/2022 0824   K 3.6 01/05/2022 0824   CL 102 01/05/2022 0824   CO2 27 01/05/2022 0824   GLUCOSE 121 (H) 01/05/2022 0824   GLUCOSE 134 (  H) 09/12/2017 2118   BUN 14 01/05/2022 0824   CREATININE 0.94 01/05/2022 0824   CREATININE 0.90 03/23/2016 1116   CALCIUM 10.6 (H) 01/05/2022 0824   GFRNONAA 55 (L) 07/10/2020 0940   GFRAA 63 07/10/2020 0940   Lab Results  Component Value Date   HGBA1C 6.3 (H) 01/05/2022   HGBA1C (H) 08/31/2010    6.0 (NOTE)                                                                       According to the ADA Clinical Practice Recommendations for 2011, when HbA1c is used as a screening test:   >=6.5%   Diagnostic of Diabetes Mellitus           (if  abnormal result  is confirmed)  5.7-6.4%   Increased risk of developing Diabetes Mellitus  References:Diagnosis and Classification of Diabetes Mellitus,Diabetes Care,2011,34(Suppl 1):S62-S69 and Standards of Medical Care in         Diabetes - 2011,Diabetes Care,2011,34  (Suppl 1):S11-S61.   Lab Results  Component Value Date   INSULIN 25.2 (H) 01/05/2022   INSULIN 20.4 08/31/2017   Lab Results  Component Value Date   TSH 1.680 08/31/2017   CBC    Component Value Date/Time   WBC 4.4 09/12/2017 2118   RBC 4.72 09/12/2017 2118   HGB 15.0 09/12/2017 2118   HGB 14.0 08/31/2017 1140   HCT 43.7 09/12/2017 2118   HCT 41.2 08/31/2017 1140   PLT 173 09/12/2017 2118   MCV 92.6 09/12/2017 2118   MCV 92 08/31/2017 1140   MCH 31.8 09/12/2017 2118   MCHC 34.3 09/12/2017 2118   RDW 13.1 09/12/2017 2118   RDW 13.7 08/31/2017 1140   Iron Studies No results found for: "IRON", "TIBC", "FERRITIN", "IRONPCTSAT" Lipid Panel     Component Value Date/Time   CHOL 149 07/14/2021 0954   TRIG 79 07/14/2021 0954   HDL 63 07/14/2021 0954   CHOLHDL 2.4 07/14/2021 0954   CHOLHDL 2.6 08/31/2010 0540   VLDL 12 08/31/2010 0540   LDLCALC 71 07/14/2021 0954   Hepatic Function Panel     Component Value Date/Time   PROT 7.5 01/05/2022 0824   ALBUMIN 4.8 (H) 01/05/2022 0824   AST 19 01/05/2022 0824   ALT 19 01/05/2022 0824   ALKPHOS 51 01/05/2022 0824   BILITOT 0.4 01/05/2022 0824      Component Value Date/Time   TSH 1.680 08/31/2017 1140   Nutritional Lab Results  Component Value Date   VD25OH 59.2 01/05/2022   VD25OH 50.3 07/14/2021   VD25OH 48.9 01/20/2021    Attestations:   I, Special Puri , acting as a Stage manager for [employer], DO., have compiled all relevant documentation for today's office visit on behalf of Thomasene Lot, DO, while in the presence of [employer], DO.  I have reviewed the above documentation for accuracy and completeness, and I agree with the above.  Molly [name], D.O.  The 21st Century Cures Act was signed into law in 2016 which includes the topic of electronic health records.  This provides immediate access to information in MyChart.  This includes consultation notes, operative notes, office notes, lab results and pathology reports.  If you have any questions  about what you read please let us know at your next visit so we can discuss your concerns and take corrective action if need be.  We are right here with you.

## 2023-03-08 DIAGNOSIS — K8050 Calculus of bile duct without cholangitis or cholecystitis without obstruction: Secondary | ICD-10-CM | POA: Diagnosis not present

## 2023-03-08 DIAGNOSIS — I10 Essential (primary) hypertension: Secondary | ICD-10-CM | POA: Diagnosis not present

## 2023-03-08 DIAGNOSIS — E1159 Type 2 diabetes mellitus with other circulatory complications: Secondary | ICD-10-CM | POA: Diagnosis not present

## 2023-03-08 DIAGNOSIS — Z7984 Long term (current) use of oral hypoglycemic drugs: Secondary | ICD-10-CM | POA: Diagnosis not present

## 2023-03-08 DIAGNOSIS — D696 Thrombocytopenia, unspecified: Secondary | ICD-10-CM | POA: Diagnosis not present

## 2023-03-08 DIAGNOSIS — Z79899 Other long term (current) drug therapy: Secondary | ICD-10-CM | POA: Diagnosis not present

## 2023-03-08 DIAGNOSIS — Z4659 Encounter for fitting and adjustment of other gastrointestinal appliance and device: Secondary | ICD-10-CM | POA: Diagnosis not present

## 2023-03-08 DIAGNOSIS — K838 Other specified diseases of biliary tract: Secondary | ICD-10-CM | POA: Diagnosis not present

## 2023-03-08 DIAGNOSIS — Z87891 Personal history of nicotine dependence: Secondary | ICD-10-CM | POA: Diagnosis not present

## 2023-03-18 ENCOUNTER — Ambulatory Visit (INDEPENDENT_AMBULATORY_CARE_PROVIDER_SITE_OTHER): Payer: Medicare PPO | Admitting: Family Medicine

## 2023-04-19 ENCOUNTER — Encounter (INDEPENDENT_AMBULATORY_CARE_PROVIDER_SITE_OTHER): Payer: Self-pay | Admitting: Family Medicine

## 2023-04-19 ENCOUNTER — Ambulatory Visit (INDEPENDENT_AMBULATORY_CARE_PROVIDER_SITE_OTHER): Payer: Medicare HMO | Admitting: Family Medicine

## 2023-04-19 VITALS — BP 127/81 | HR 75 | Temp 98.2°F | Ht 63.0 in | Wt 196.0 lb

## 2023-04-19 DIAGNOSIS — E1169 Type 2 diabetes mellitus with other specified complication: Secondary | ICD-10-CM | POA: Diagnosis not present

## 2023-04-19 DIAGNOSIS — E1159 Type 2 diabetes mellitus with other circulatory complications: Secondary | ICD-10-CM

## 2023-04-19 DIAGNOSIS — Z6835 Body mass index (BMI) 35.0-35.9, adult: Secondary | ICD-10-CM

## 2023-04-19 DIAGNOSIS — I152 Hypertension secondary to endocrine disorders: Secondary | ICD-10-CM

## 2023-04-19 DIAGNOSIS — K8050 Calculus of bile duct without cholangitis or cholecystitis without obstruction: Secondary | ICD-10-CM

## 2023-04-19 DIAGNOSIS — Z7984 Long term (current) use of oral hypoglycemic drugs: Secondary | ICD-10-CM

## 2023-04-19 DIAGNOSIS — Z6834 Body mass index (BMI) 34.0-34.9, adult: Secondary | ICD-10-CM

## 2023-04-19 DIAGNOSIS — E559 Vitamin D deficiency, unspecified: Secondary | ICD-10-CM

## 2023-04-19 NOTE — Progress Notes (Signed)
Molly [name], D.O.  ABFM, ABOM Specializing in Clinical Bariatric Medicine  Office located at: [address] [place], [place]  [address]     Assessment and Plan:   Orders Placed This Encounter  Procedures   VITAMIN D 25 Hydroxy (Vit-D Deficiency, Fractures)   Magnesium   Vitamin B12    Type 2 diabetes mellitus with obesity [facility]) Assessment & Plan: Lab Results  Component Value Date   HGBA1C 6.3 (H) 01/05/2022   HGBA1C 6.1 (H) 07/14/2021   HGBA1C 6.3 (H) 01/20/2021   INSULIN 25.2 (H) 01/05/2022   INSULIN 18.4 07/14/2021   INSULIN 17.6 01/20/2021    Per patient, her sugars are well controlled. Compliant with Metformin 500 mg BID. Tolerating well and reports no significant side effects. Reports she has been struggling to "get her protein in". On 04/09/23 Pt had labs per PCP. We reviewed her labs from 04/09/23. Her A1C was stable at 6.6, no change from prior. Discussed the importance of increasing daily protein intake for weight loss. Continue on her current regimen per PCP/specialists. Continue to decrease simple carbs/ sugars; increase fiber and proteins -> follow her meal plan.     Hypertension associated with type 2 diabetes mellitus [facility]) Assessment & Plan: BP Readings from Last 3 Encounters:  04/19/23 127/81  02/11/23 113/72  01/07/23 138/79   She is on amlodipine 10 mg, chlorthalidone 25 mg, losartan 100 mg. Tolerating well with no adverse side effects. She reports a BP reading of 145/78. Pt endorses occaional low BP, however, she has changed her regimen to taking chlorthalidone in the AM and cozaar and norvasc at night. Since making this change, she has seen improvements. Continue with this current regimen. We will continue to monitor symptoms as they relate to the her weight loss journey.   Vitamin D deficiency, unspecified Assessment & Plan: Lab Results  Component Value Date   VD25OH 54.5 04/19/2023   VD25OH 59.2 01/05/2022   VD25OH 50.3 07/14/2021    Molly [name] reports she went off her vitamin D supplement since 6/13. We reviewed her last vitamin D, which was 82.3 on 01/01/2023. Ideal vitamin D levels reviewed with patient. We discussed the higher risk of vitamin B12 and magnesium deficiency when taking Metformin. Her questions and concerns regarding this condition were all addressed. She is currently on magnesium supplements daily, with no adverse side effects. We will check vitamin D, B12, and magnesium levels today.    Choledocholithiasis Pt reports an outpatient operative procedure at [facility] on 03/08/23 for choledocholithiasis. She endorses being unable to eat much post-op. I recommend she follow up with GI/specialists for further evaluation and concerns.    BMI 35.0-35.9,adult-current 34.73 Morbid obesity (HCC)-start bmi 39.50/date 08/31/2017 Assessment & Plan: [name] is here to discuss her progress with her obesity treatment plan along with follow-up of her obesity related diagnoses. See Medical Weight Management Flowsheet for complete bioelectrical impedance results.  Since last office visit on 02/11/23 patient's muscle mass has decreased by 0.2 lbs. Fat mass has increased by 1.8 lbs. Total body water has decreased by 2.2lbs.  Counseling done on how various foods will affect these numbers and how to maximize success  Total lbs lost to date: 27 Total weight loss percentage to date: -12.11 %   No change to meal plan - see Subjective  Behavioral Intervention Additional resources provided today: protein equivalence handout and Prescribed Pescetarian Plan  and Protein content handout.  Evidence-based interventions for health behavior change were utilized today including  the discussion of self monitoring techniques, problem-solving barriers and SMART goal setting techniques.   Regarding patient's less desirable eating habits and patterns, we employed the technique of small changes.  Pt will specifically work on: Following her meal  plan and increase daily protein intake for next visit.    FOLLOW UP: Return in about 4 weeks (around 05/17/2023). She was informed of the importance of frequent follow up visits to maximize her success with intensive lifestyle modifications for her multiple health conditions.  Subjective:   Chief complaint: Obesity [name] is here to discuss her progress with her obesity treatment plan. She is on the the [facility] and states she is following her eating plan approximately 80% of the time. She states she is exercising (yoga classes) 45 minutes 3 days per week.  Interval History:  Kellis Topete is here for a follow up office visit. Since last OV,  she reports an outpatient surgical procedure for choledocholithiasis. Due to her procedure, she was unable to follow her meal plan. She is now feeling better and has been able to follow her plan more in the last 2 weeks.    Pharmacotherapy for weight loss: She is currently taking  Metformin  for medical weight loss.  Denies side effects.    Review of Systems:  Pertinent positives were addressed with patient today.  Reviewed by clinician on day of visit: allergies, medications, problem list, medical history, surgical history, family history, social history, and previous encounter notes.  Weight Summary and Biometrics   Weight Lost Since Last Visit: 0lb  Weight Gained Since Last Visit: 2lb    Vitals Temp: 98.2 F (36.8 C) BP: 127/81 Pulse Rate: 75 SpO2: 98 %   Anthropometric Measurements Height: 5\' 3"  (1.6 m) Weight: 196 lb (88.9 kg) BMI (Calculated): 34.73 Weight at Last Visit: 194lb Weight Lost Since Last Visit: 0lb Weight Gained Since Last Visit: 2lb Starting Weight: 223lb Total Weight Loss (lbs): 19 lb (8.618 kg) Peak Weight: 230lb   Body Composition  Body Fat %: 43.8 % Fat Mass (lbs): 85.8 lbs Muscle Mass (lbs): 104.6 lbs Total Body Water (lbs): 75.4 lbs Visceral Fat Rating : 14   Other Clinical Data Fasting:  no Labs: no Today's Visit #: 76 Starting Date: 08/31/17    Objective:   PHYSICAL EXAM: Blood pressure 127/81, pulse 75, temperature 98.2 F (36.8 C), height 5\' 3"  (1.6 m), weight 196 lb (88.9 kg), SpO2 98%. Body mass index is 34.72 kg/m.  General: Well Developed, well nourished, and in no acute distress.  HEENT: Normocephalic, atraumatic Skin: Warm and dry, cap RF less 2 sec, good turgor Chest:  Normal excursion, shape, no gross abn Respiratory: speaking in full sentences, no conversational dyspnea NeuroM-Sk: Ambulates w/o assistance, moves * 4 Psych: A and O *3, insight good, mood-full  DIAGNOSTIC DATA REVIEWED:  BMET    Component Value Date/Time   NA 142 01/05/2022 0824   K 3.6 01/05/2022 0824   CL 102 01/05/2022 0824   CO2 27 01/05/2022 0824   GLUCOSE 121 (H) 01/05/2022 0824   GLUCOSE 134 (H) 09/12/2017 2118   BUN 14 01/05/2022 0824   CREATININE 0.94 01/05/2022 0824   CREATININE 0.90 03/23/2016 1116   CALCIUM 10.6 (H) 01/05/2022 0824   GFRNONAA 55 (L) 07/10/2020 0940   GFRAA 63 07/10/2020 0940   Lab Results  Component Value Date   HGBA1C 6.3 (H) 01/05/2022   HGBA1C (H) 08/31/2010    6.0 (NOTE)  According to the ADA Clinical Practice Recommendations for 2011, when HbA1c is used as a screening test:   >=6.5%   Diagnostic of Diabetes Mellitus           (if abnormal result  is confirmed)  5.7-6.4%   Increased risk of developing Diabetes Mellitus  References:Diagnosis and Classification of Diabetes Mellitus,Diabetes Care,2011,34(Suppl 1):S62-S69 and Standards of Medical Care in         Diabetes - 2011,Diabetes Care,2011,34  (Suppl 1):S11-S61.   Lab Results  Component Value Date   INSULIN 25.2 (H) 01/05/2022   INSULIN 20.4 08/31/2017   Lab Results  Component Value Date   TSH 1.680 08/31/2017   CBC    Component Value Date/Time   WBC 4.4 09/12/2017 2118   RBC 4.72 09/12/2017 2118   HGB 15.0  09/12/2017 2118   HGB 14.0 08/31/2017 1140   HCT 43.7 09/12/2017 2118   HCT 41.2 08/31/2017 1140   PLT 173 09/12/2017 2118   MCV 92.6 09/12/2017 2118   MCV 92 08/31/2017 1140   MCH 31.8 09/12/2017 2118   MCHC 34.3 09/12/2017 2118   RDW 13.1 09/12/2017 2118   RDW 13.7 08/31/2017 1140   Iron Studies No results found for: "IRON", "TIBC", "FERRITIN", "IRONPCTSAT" Lipid Panel     Component Value Date/Time   CHOL 149 07/14/2021 0954   TRIG 79 07/14/2021 0954   HDL 63 07/14/2021 0954   CHOLHDL 2.4 07/14/2021 0954   CHOLHDL 2.6 08/31/2010 0540   VLDL 12 08/31/2010 0540   LDLCALC 71 07/14/2021 0954   Hepatic Function Panel     Component Value Date/Time   PROT 7.5 01/05/2022 0824   ALBUMIN 4.8 (H) 01/05/2022 0824   AST 19 01/05/2022 0824   ALT 19 01/05/2022 0824   ALKPHOS 51 01/05/2022 0824   BILITOT 0.4 01/05/2022 0824      Component Value Date/Time   TSH 1.680 08/31/2017 1140   Nutritional Lab Results  Component Value Date   VD25OH 59.2 01/05/2022   VD25OH 50.3 07/14/2021   VD25OH 48.9 01/20/2021    Attestations:   I, Isabelle Course, acting as a Stage manager for Thomasene Lot, DO., have compiled all relevant documentation for today's office visit on behalf of Thomasene Lot, DO, while in the presence of [employer], DO.  I have reviewed the above documentation for accuracy and completeness, and I agree with the above. Molly [name], D.O.  The 21st Century Cures Act was signed into law in 2016 which includes the topic of electronic health records.  This provides immediate access to information in MyChart.  This includes consultation notes, operative notes, office notes, lab results and pathology reports.  If you have any questions about what you read please let us know at your next visit so we can discuss your concerns and take corrective action if need be.  We are right here with you.

## 2023-04-20 LAB — VITAMIN B12: Vitamin B-12: 804 pg/mL (ref 232–1245)

## 2023-04-20 LAB — VITAMIN D 25 HYDROXY (VIT D DEFICIENCY, FRACTURES): Vit D, 25-Hydroxy: 54.5 ng/mL (ref 30.0–100.0)

## 2023-04-20 LAB — MAGNESIUM: Magnesium: 2.2 mg/dL (ref 1.6–2.3)

## 2023-05-18 ENCOUNTER — Ambulatory Visit (INDEPENDENT_AMBULATORY_CARE_PROVIDER_SITE_OTHER): Payer: Medicare HMO | Admitting: Family Medicine

## 2023-05-31 ENCOUNTER — Ambulatory Visit (INDEPENDENT_AMBULATORY_CARE_PROVIDER_SITE_OTHER): Payer: Medicare HMO | Admitting: Family Medicine

## 2023-05-31 ENCOUNTER — Encounter (INDEPENDENT_AMBULATORY_CARE_PROVIDER_SITE_OTHER): Payer: Self-pay | Admitting: Family Medicine

## 2023-05-31 VITALS — BP 148/82 | HR 84 | Temp 97.5°F | Ht 63.0 in | Wt 201.0 lb

## 2023-05-31 DIAGNOSIS — E559 Vitamin D deficiency, unspecified: Secondary | ICD-10-CM

## 2023-05-31 DIAGNOSIS — Z6835 Body mass index (BMI) 35.0-35.9, adult: Secondary | ICD-10-CM

## 2023-05-31 DIAGNOSIS — E1159 Type 2 diabetes mellitus with other circulatory complications: Secondary | ICD-10-CM | POA: Diagnosis not present

## 2023-05-31 DIAGNOSIS — E1169 Type 2 diabetes mellitus with other specified complication: Secondary | ICD-10-CM

## 2023-05-31 DIAGNOSIS — Z7984 Long term (current) use of oral hypoglycemic drugs: Secondary | ICD-10-CM

## 2023-05-31 DIAGNOSIS — E669 Obesity, unspecified: Secondary | ICD-10-CM

## 2023-05-31 DIAGNOSIS — I152 Hypertension secondary to endocrine disorders: Secondary | ICD-10-CM

## 2023-05-31 NOTE — Progress Notes (Signed)
[name], D.O.  ABFM, ABOM Specializing in Clinical Bariatric Medicine  Office located at: [address] [place], [place]  [address]     Assessment and Plan:   Medications Discontinued During This Encounter  Medication Reason   chlorthalidone (HYGROTON) 25 MG tablet    fluticasone (FLONASE) 50 MCG/ACT nasal spray    losartan (COZAAR) 100 MG tablet     Type 2 diabetes mellitus with obesity (HCC) Assessment: Condition is Not optimized.. This is being treated with Metformin BID and denies any adverse side effects or GI upset.Pt feels as since being to Metformin it was initially helping but now she feels as she at an standstill. She continues to check her sugars consistently throughout the day.  Lab Results  Component Value Date   HGBA1C 6.3 (H) 01/05/2022   HGBA1C 6.1 (H) 07/14/2021   HGBA1C 6.3 (H) 01/20/2021   INSULIN 25.2 (H) 01/05/2022   INSULIN 18.4 07/14/2021   INSULIN 17.6 01/20/2021   Plan:- Continue Metformin 500mg  BID. She denies need for refill.    - She is to contact her insurance agency to see if the cover Ozempic or Mounjaro due to elevation in her A1c.   - Reminded Thurmon Fair if she feels poorly- check Blood Sugar and Blood Pressure at that time.     - Importance of f/up with PCP and all other specialists, as scheduled, was stressed to the patient today   Labs were reviewed with patient today and education provided on them. We discussed how the foods patient eats may influence these laboratory findings.  All of the patient's questions about them were answered    Vitamin D deficiency, unspecified Assessment: Condition is Controlled.. Her vitamin D level dropped slightly from 59.2 to 54.5 but still within optimal range. Her vitamin B-12 level is within is optimal range. She does not take a individual vitamin D or B-12 supplement and has been taking a multivitamin supplement. She has been off of her vitamin D supplement since 01/07/2023. Her magnesium  level is within optimal range at 2.2 as of 04/19/2023.  Lab Results  Component Value Date   VD25OH 54.5 04/19/2023   VD25OH 59.2 01/05/2022   VD25OH 50.3 07/14/2021   Lab Results  Component Value Date   VITAMINB12 804 04/19/2023   Component Ref Range & Units 1 mo ago (04/19/23) 12 yr ago (09/01/10) 12 yr ago (08/31/10)  Magnesium 1.6 - 2.3 mg/dL 2.2 2.2 R 2.3 R   Plan: - Continue her multivitamin supplement as directed.   -  she was encouraged to continue to take the medicine until told otherwise.     - weight loss will likely improve availability of vitamin D, thus encouraged Dimitra to continue with meal plan and their weight loss efforts to further improve this condition.  Thus, we will need to monitor levels regularly (every 3-4 mo on average) to keep levels within normal limits and prevent over supplementation.  Labs were reviewed with patient today and education provided on them. We discussed how the foods patient eats may influence these laboratory findings.  All of the patient's questions about them were answered    Hypertension associated with type 2 diabetes mellitus (HCC) Assessment: Condition is Not optimized.[name] Her BP today is elevated at 148/82 and this is being treated with Norvasc. Her CMP back in May showed a calcium of 10.7 that decrease to 10.5 in September. Her corrected calcium when considered her serum albumin is actually 10.3.  Last 3 blood  pressure readings in our office are as follows: BP Readings from Last 3 Encounters:  05/31/23 (!) 148/82  04/19/23 127/81  02/11/23 113/72   Plan: - Continue with Norvasc 10mg  as directed.   - Lifestyle changes such as following our low salt, heart healthy meal plan and engaging in a regular exercise program discussed   - Ambulatory blood pressure monitoring encouraged.   - We will continue to monitor closely alongside PCP/ specialists.  Pt reminded to also f/up with those individuals as instructed by them.   Labs were  reviewed with patient today and education provided on them. We discussed how the foods patient eats may influence these laboratory findings.  All of the patient's questions about them were answered    TREATMENT PLAN FOR OBESITY: BMI 35.0-35.9,adult-current 35.61 Morbid obesity (HCC)-start bmi 39.50/date 08/31/2017 Assessment:  Keyonia Gluth is here to discuss her progress with her obesity treatment plan along with follow-up of her obesity related diagnoses. See Medical Weight Management Flowsheet for complete bioelectrical impedance results.  Condition is improving but not optimized. Biometric data collected today, was reviewed with patient.   Since last office visit on 05/31/2023 patient's  Muscle mass has decreased by 0.8lb. Fat mass has increased by 5.8lb. Total body water has increased by 3.2lb.  Counseling done on how various foods will affect these numbers and how to maximize success  Total lbs lost to date: 22 Total weight loss percentage to date: 9.87%   Plan: - Aayla is currently in the action stage of change and will continue to follow the Pescatarian meal plan.    Behavioral Intervention Additional resources provided today: category 1 meal plan information Evidence-based interventions for health behavior change were utilized today including the discussion of self monitoring techniques, problem-solving barriers and SMART goal setting techniques.   Regarding patient's less desirable eating habits and patterns, we employed the technique of small changes.  Pt will specifically work on: refocus on the meal plan.  for next visit.     She has agreed to Think about enjoyable ways to increase daily physical activity and overcoming barriers to exercise and Increase physical activity in their day and reduce sedentary time (increase NEAT).   FOLLOW UP: Return in about 3 weeks (around 06/21/2023).  She was informed of the importance of frequent follow up visits to maximize her success with  intensive lifestyle modifications for her multiple health conditions.  Subjective:   Chief complaint: Obesity Hopie is here to discuss her progress with her obesity treatment plan. She is on the the [facility] and states she is following her eating plan approximately 80% of the time. She states she is not exercising.  Interval History:  Tanna Loeffler is here for a follow up office visit.     Since last office visit:  She has recently returned from a church retreat and informed me that her niece passed. During this time she ate off plan such as desserts, carbs, and fried foods. She did extra walking during her vacation but did not specifically exercise individual from day to day activities there.   We reviewed her meal plan and all questions were answered.   Pharmacotherapy for weight loss: She is currently taking  Metformin  for medical weight loss.  Denies side effects.    Review of Systems:  Pertinent positives were addressed with patient today.  Reviewed by clinician on day of visit: allergies, medications, problem list, medical history, surgical history, family history, social history, and previous encounter notes.  Weight Summary and Biometrics   Weight Lost Since Last Visit: 0lb  Weight Gained Since Last Visit: 5lb   Vitals Temp: (!) 97.5 F (36.4 C) BP: (!) 148/82 Pulse Rate: 84 SpO2: 98 %   Anthropometric Measurements Height: 5\' 3"  (1.6 m) Weight: 201 lb (91.2 kg) BMI (Calculated): 35.61 Weight at Last Visit: 196lb Weight Lost Since Last Visit: 0lb Weight Gained Since Last Visit: 5lb Starting Weight: 223lb Total Weight Loss (lbs): 22 lb (9.979 kg) Peak Weight: 230lb   Body Composition  Body Fat %: 45.6 % Fat Mass (lbs): 91.6 lbs Muscle Mass (lbs): 103.8 lbs Total Body Water (lbs): 78.6 lbs Visceral Fat Rating : 15   Other Clinical Data Fasting: no Labs: no Today's Visit #: 77 Starting Date: 08/31/17     Objective:   PHYSICAL EXAM: Blood  pressure (!) 148/82, pulse 84, temperature (!) 97.5 F (36.4 C), height 5\' 3"  (1.6 m), weight 201 lb (91.2 kg), SpO2 98%. Body mass index is 35.61 kg/m.  General: Well Developed, well nourished, and in no acute distress.  HEENT: Normocephalic, atraumatic Skin: Warm and dry, cap RF less 2 sec, good turgor Chest:  Normal excursion, shape, no gross abn Respiratory: speaking in full sentences, no conversational dyspnea NeuroM-Sk: Ambulates w/o assistance, moves * 4 Psych: A and O *3, insight good, mood-full  DIAGNOSTIC DATA REVIEWED:  BMET    Component Value Date/Time   NA 142 01/05/2022 0824   K 3.6 01/05/2022 0824   CL 102 01/05/2022 0824   CO2 27 01/05/2022 0824   GLUCOSE 121 (H) 01/05/2022 0824   GLUCOSE 134 (H) 09/12/2017 2118   BUN 14 01/05/2022 0824   CREATININE 0.94 01/05/2022 0824   CREATININE 0.90 03/23/2016 1116   CALCIUM 10.6 (H) 01/05/2022 0824   GFRNONAA 55 (L) 07/10/2020 0940   GFRAA 63 07/10/2020 0940   Lab Results  Component Value Date   HGBA1C 6.3 (H) 01/05/2022   HGBA1C (H) 08/31/2010    6.0 (NOTE)                                                                       According to the ADA Clinical Practice Recommendations for 2011, when HbA1c is used as a screening test:   >=6.5%   Diagnostic of Diabetes Mellitus           (if abnormal result  is confirmed)  5.7-6.4%   Increased risk of developing Diabetes Mellitus  References:Diagnosis and Classification of Diabetes Mellitus,Diabetes Care,2011,34(Suppl 1):S62-S69 and Standards of Medical Care in         Diabetes - 2011,Diabetes Care,2011,34  (Suppl 1):S11-S61.   Lab Results  Component Value Date   INSULIN 25.2 (H) 01/05/2022   INSULIN 20.4 08/31/2017   Lab Results  Component Value Date   TSH 1.680 08/31/2017   CBC    Component Value Date/Time   WBC 4.4 09/12/2017 2118   RBC 4.72 09/12/2017 2118   HGB 15.0 09/12/2017 2118   HGB 14.0 08/31/2017 1140   HCT 43.7 09/12/2017 2118   HCT 41.2 08/31/2017  1140   PLT 173 09/12/2017 2118   MCV 92.6 09/12/2017 2118   MCV 92 08/31/2017 1140   MCH 31.8 09/12/2017 2118   MCHC 34.3 09/12/2017  2118   RDW 13.1 09/12/2017 2118   RDW 13.7 08/31/2017 1140   Iron Studies No results found for: "IRON", "TIBC", "FERRITIN", "IRONPCTSAT" Lipid Panel     Component Value Date/Time   CHOL 149 07/14/2021 0954   TRIG 79 07/14/2021 0954   HDL 63 07/14/2021 0954   CHOLHDL 2.4 07/14/2021 0954   CHOLHDL 2.6 08/31/2010 0540   VLDL 12 08/31/2010 0540   LDLCALC 71 07/14/2021 0954   Hepatic Function Panel     Component Value Date/Time   PROT 7.5 01/05/2022 0824   ALBUMIN 4.8 (H) 01/05/2022 0824   AST 19 01/05/2022 0824   ALT 19 01/05/2022 0824   ALKPHOS 51 01/05/2022 0824   BILITOT 0.4 01/05/2022 0824      Component Value Date/Time   TSH 1.680 08/31/2017 1140   Nutritional Lab Results  Component Value Date   VD25OH 54.5 04/19/2023   VD25OH 59.2 01/05/2022   VD25OH 50.3 07/14/2021    Attestations:   I, Clinical biochemist, acting as a Stage manager for [employer], DO., have compiled all relevant documentation for today's office visit on behalf of Thomasene Lot, DO, while in the presence of [employer], DO.  I have reviewed the above documentation for accuracy and completeness, and I agree with the above. [name], D.O.  The 21st Century Cures Act was signed into law in 2016 which includes the topic of electronic health records.  This provides immediate access to information in MyChart.  This includes consultation notes, operative notes, office notes, lab results and pathology reports.  If you have any questions about what you read please let us know at your next visit so we can discuss your concerns and take corrective action if need be.  We are right here with you.

## 2023-06-07 ENCOUNTER — Telehealth (INDEPENDENT_AMBULATORY_CARE_PROVIDER_SITE_OTHER): Payer: Self-pay | Admitting: Adult Health

## 2023-06-07 NOTE — Telephone Encounter (Signed)
Pt called to inform the doctor that Ozempic is covered by her insurance.

## 2023-06-21 ENCOUNTER — Ambulatory Visit (INDEPENDENT_AMBULATORY_CARE_PROVIDER_SITE_OTHER): Payer: Medicare HMO | Admitting: Adult Health

## 2023-06-28 ENCOUNTER — Ambulatory Visit (INDEPENDENT_AMBULATORY_CARE_PROVIDER_SITE_OTHER): Payer: Medicare HMO | Admitting: Adult Health

## 2023-06-28 ENCOUNTER — Encounter (INDEPENDENT_AMBULATORY_CARE_PROVIDER_SITE_OTHER): Payer: Self-pay | Admitting: Adult Health

## 2023-06-28 VITALS — BP 152/77 | HR 80 | Temp 98.4°F | Ht 63.0 in | Wt 199.0 lb

## 2023-06-28 DIAGNOSIS — E119 Type 2 diabetes mellitus without complications: Secondary | ICD-10-CM

## 2023-06-28 DIAGNOSIS — Z7985 Long-term (current) use of injectable non-insulin antidiabetic drugs: Secondary | ICD-10-CM | POA: Diagnosis not present

## 2023-06-28 DIAGNOSIS — Z6835 Body mass index (BMI) 35.0-35.9, adult: Secondary | ICD-10-CM | POA: Diagnosis not present

## 2023-06-28 DIAGNOSIS — E669 Obesity, unspecified: Secondary | ICD-10-CM

## 2023-06-28 DIAGNOSIS — Z7984 Long term (current) use of oral hypoglycemic drugs: Secondary | ICD-10-CM | POA: Diagnosis not present

## 2023-06-28 DIAGNOSIS — E1169 Type 2 diabetes mellitus with other specified complication: Secondary | ICD-10-CM | POA: Diagnosis not present

## 2023-06-28 DIAGNOSIS — E559 Vitamin D deficiency, unspecified: Secondary | ICD-10-CM | POA: Diagnosis not present

## 2023-06-28 MED ORDER — LANCET DEVICE MISC: 1.0000 | Freq: Three times a day (TID) | 0 refills | Status: AC

## 2023-06-28 MED ORDER — LANCETS MISC. MISC: 1.0000 | Freq: Three times a day (TID) | 0 refills | Status: AC

## 2023-06-28 MED ORDER — BLOOD GLUCOSE TEST VI STRP: 1.0000 | ORAL_STRIP | Freq: Three times a day (TID) | 0 refills | Status: AC

## 2023-06-28 MED ORDER — OZEMPIC (0.25 OR 0.5 MG/DOSE) 2 MG/1.5ML ~~LOC~~ SOPN: 0.2500 mg | PEN_INJECTOR | SUBCUTANEOUS | 0 refills | Status: DC

## 2023-06-28 MED ORDER — BLOOD GLUCOSE MONITORING SUPPL DEVI: 1.0000 | Freq: Three times a day (TID) | 0 refills | Status: AC

## 2023-06-28 NOTE — Progress Notes (Signed)
WEIGHT SUMMARY AND BIOMETRICS  Vitals Temp: 98.4 F (36.9 C) BP: (!) 152/77 Pulse Rate: 80 SpO2: 97 %   Anthropometric Measurements Height: 5\' 3"  (1.6 m) Weight: 199 lb (90.3 kg) BMI (Calculated): 35.26 Weight at Last Visit: 201lb Weight Lost Since Last Visit: 2lb Weight Gained Since Last Visit: 0 Starting Weight: 223lb Total Weight Loss (lbs): 24 lb (10.9 kg) Peak Weight: 230lb   Body Composition  Body Fat %: 44.2 % Fat Mass (lbs): 88 lbs Muscle Mass (lbs): 105.4 lbs Total Body Water (lbs): 75.6 lbs Visceral Fat Rating : 15   Other Clinical Data Fasting: yes Labs: no Today's Visit #: 77 Starting Date: 08/31/17    Chief Complaint:   OBESITY Molly [name] is here to discuss her progress with her obesity treatment plan. She is on the the [facility] and states she is following her eating plan approximately 80 % of the time.  She states she is exercising Yoga, [employer] 45 minutes 3 times per week.   Interim History:  Her BP is above goal at OV She reports NOT taking daily Amlodipine 10mg  this morning prior to OV She denies CP with exertion  She has researched Ozempic and Mounjaor therapy- she is agreeable to beginning Ozempic. She has confirmed that her insurance WILL cover Ozempic.  Subjective:   1. Type 2 diabetes mellitus with obesity (HCC) She is currently on Metformin 500mg  BID with meals She is tolerating well. She researched various injectable options and she would like to start ozempic therapy. Pt has confirmed with her insurance will cover GLP-1 therapy  She denies family hx of MEN 2 or MTC She denies personal hx of pancreatitis She is post menopausal   She reports fasting CBG to range from high 90s to 110s She denies sx's of hypoglycemia  2. Vitamin [name] deficiency, unspecified  Latest Reference Range & Units 07/14/21 09:54 01/05/22 08:24 04/19/23 11:14  Vitamin [name], 25-Hydroxy 30.0 - 100.0 ng/mL 50.3 59.2 54.5   She endorses  stable energy levels and significant reduction in diffuse pain  Assessment/Plan:   1. Type 2 diabetes mellitus with obesity (HCC) Continue Metformin 500mg  BID Start Semaglutide,0.25 or 0.5MG /DOS, (OZEMPIC, 0.25 OR 0.5 MG/DOSE,) 2 MG/1.5ML SOPN Inject 0.25 mg into the skin once a week. Dispense: 2 mL, Refills: 0 ordered   06/28/2023 -- Molly [name], Molly [name], Molly [name]                             Glucose Blood (BLOOD GLUCOSE TEST STRIPS) STRP 1 each by In Vitro route in the morning, at noon, and at bedtime. May substitute to any manufacturer covered by patient's insurance. Dispense: 100 strip, Refills: 0 ordered    Blood Glucose Monitoring Suppl DEVI 1 each by Does not apply route in the morning, at noon, and at bedtime. May substitute to any manufacturer covered by patient's insurance. Dispense: 1 each, Refills: 0 ordered    Lancet Device MISC 1 each by Does not apply route in the morning, at noon, and at bedtime. May substitute to any manufacturer covered by patient's insurance. Dispense: 1 each, Refills: 0 ordered   2. Vitamin [name] deficiency, unspecified Monitor Labs  3. BMI 35.0-35.9,adult-current 35.26  Molly [name] is currently in the action stage of change. As such, her goal is to continue with weight loss efforts. She has agreed to the [employer].   Exercise goals: Older adults should follow the adult guidelines. When older  adults cannot meet the adult guidelines, they should be as physically active as their abilities and conditions will allow.  Older adults should do exercises that maintain or improve balance if they are at risk of falling.  Older adults should determine their level of effort for physical activity relative to their level of fitness.  Older adults with chronic conditions should understand whether and how their conditions affect their ability to do regular physical activity safely.  Behavioral modification strategies: increasing lean protein intake, decreasing simple carbohydrates,  increasing vegetables, increasing water intake, no skipping meals, meal planning and cooking strategies, keeping healthy foods in the home, better snacking choices, and planning for success.  Pretty has agreed to follow-up with our clinic in 4 weeks. She was informed of the importance of frequent follow-up visits to maximize her success with intensive lifestyle modifications for her multiple health conditions.   Objective:   Blood pressure (!) 152/77, pulse 80, temperature 98.4 F (36.9 C), height 5\' 3"  (1.6 m), weight 199 lb (90.3 kg), SpO2 97%. Body mass index is 35.25 kg/m.  General: Cooperative, alert, well developed, in no acute distress. HEENT: Conjunctivae and lids unremarkable. Cardiovascular: Regular rhythm.  Lungs: Normal work of breathing. Neurologic: No focal deficits.   Lab Results  Component Value Date   CREATININE 0.94 01/05/2022   BUN 14 01/05/2022   NA 142 01/05/2022   K 3.6 01/05/2022   CL 102 01/05/2022   CO2 27 01/05/2022   Lab Results  Component Value Date   ALT 19 01/05/2022   AST 19 01/05/2022   ALKPHOS 51 01/05/2022   BILITOT 0.4 01/05/2022   Lab Results  Component Value Date   HGBA1C 6.3 (H) 01/05/2022   HGBA1C 6.1 (H) 07/14/2021   HGBA1C 6.3 (H) 01/20/2021   HGBA1C 6.2 (H) 07/10/2020   HGBA1C 5.9 (H) 12/14/2019   Lab Results  Component Value Date   INSULIN 25.2 (H) 01/05/2022   INSULIN 18.4 07/14/2021   INSULIN 17.6 01/20/2021   INSULIN 12.0 07/10/2020   INSULIN 16.0 12/14/2019   Lab Results  Component Value Date   TSH 1.680 08/31/2017   Lab Results  Component Value Date   CHOL 149 07/14/2021   HDL 63 07/14/2021   LDLCALC 71 07/14/2021   TRIG 79 07/14/2021   CHOLHDL 2.4 07/14/2021   Lab Results  Component Value Date   VD25OH 54.5 04/19/2023   VD25OH 59.2 01/05/2022   VD25OH 50.3 07/14/2021   Lab Results  Component Value Date   WBC 4.4 09/12/2017   HGB 15.0 09/12/2017   HCT 43.7 09/12/2017   MCV 92.6 09/12/2017   PLT 173  09/12/2017   No results found for: "IRON", "TIBC", "FERRITIN"  Attestation Statements:   Reviewed by clinician on day of visit: allergies, medications, problem list, medical history, surgical history, family history, social history, and previous encounter notes.  I have reviewed the above documentation for accuracy and completeness, and I agree with the above. -  Molly [name] [name]. Semir Brill, Molly [name]-C

## 2023-07-20 ENCOUNTER — Other Ambulatory Visit (INDEPENDENT_AMBULATORY_CARE_PROVIDER_SITE_OTHER): Payer: Self-pay | Admitting: Adult Health

## 2023-08-05 ENCOUNTER — Encounter (INDEPENDENT_AMBULATORY_CARE_PROVIDER_SITE_OTHER): Payer: Self-pay | Admitting: Family Medicine

## 2023-08-05 ENCOUNTER — Ambulatory Visit (INDEPENDENT_AMBULATORY_CARE_PROVIDER_SITE_OTHER): Payer: Medicare HMO | Admitting: Family Medicine

## 2023-08-05 VITALS — BP 139/87 | HR 77 | Temp 97.8°F | Ht 63.0 in | Wt 201.0 lb

## 2023-08-05 DIAGNOSIS — J302 Other seasonal allergic rhinitis: Secondary | ICD-10-CM | POA: Diagnosis not present

## 2023-08-05 DIAGNOSIS — Z7985 Long-term (current) use of injectable non-insulin antidiabetic drugs: Secondary | ICD-10-CM

## 2023-08-05 DIAGNOSIS — Z6835 Body mass index (BMI) 35.0-35.9, adult: Secondary | ICD-10-CM

## 2023-08-05 DIAGNOSIS — D696 Thrombocytopenia, unspecified: Secondary | ICD-10-CM | POA: Diagnosis not present

## 2023-08-05 DIAGNOSIS — E669 Obesity, unspecified: Secondary | ICD-10-CM

## 2023-08-05 DIAGNOSIS — E1169 Type 2 diabetes mellitus with other specified complication: Secondary | ICD-10-CM

## 2023-08-05 MED ORDER — OZEMPIC (0.25 OR 0.5 MG/DOSE) 2 MG/1.5ML ~~LOC~~ SOPN: 0.2500 mg | PEN_INJECTOR | SUBCUTANEOUS | 0 refills | Status: DC

## 2023-08-05 MED ORDER — LEVOCETIRIZINE DIHYDROCHLORIDE 5 MG PO TABS: 5.0000 mg | ORAL_TABLET | Freq: Every evening | ORAL | 0 refills | Status: DC

## 2023-08-05 NOTE — Progress Notes (Signed)
 Molly [name], D.O.  ABFM, ABOM Specializing in Clinical Bariatric Medicine  Office located at: [address] [place], [place]  [address]   Assessment and Plan:   FOR THE DISEASE OF OBESITY: BMI 35.0-35.9,adult-current 35.61 Morbid obesity (HCC)-start bmi 39.50/date 08/31/2017 Assessment & Plan: Since last office visit on 06/28/23 patient's  Muscle mass has increased by 0.2 lb. Fat mass has increased by 2 lb. Total body water has increased by 1 lb.  Counseling done on how various foods will affect these numbers and how to maximize success  Total lbs lost to date: 22 lbs  Total weight loss percentage to date: 9.87%    Recommended Dietary Goals Allianna is currently in the action stage of change. As such, her goal is to continue weight management plan.  She has agreed to: continue current plan   Behavioral Intervention We discussed the following today: increasing lean protein intake to established goals and avoiding skipping meals  Additional resources provided today:  handout on Healthy Tuna Salad Recipe   Evidence-based interventions for health behavior change were utilized today including the discussion of self monitoring techniques, problem-solving barriers and SMART goal setting techniques.   Regarding patient's less desirable eating habits and patterns, we employed the technique of small changes.   Pt will specifically work on: n/a   Recommended Physical Activity Goals [name] has been advised to work up to 150 minutes of moderate intensity aerobic activity a week and strengthening exercises 2-3 times per week for cardiovascular health, weight loss maintenance and preservation of muscle mass.   She has agreed to : Continue current level of physical activity    Pharmacotherapy We both agreed to : continue with nutritional and behavioral strategies and continue current anti-obesity medication regimen   FOR ASSOCIATED CONDITIONS ADDRESSED TODAY:  Type 2 diabetes  mellitus with obesity (HCC) Assessment & Plan: T2DM treated with Metformin  500 mg twice daily and Ozempic  0.25 mg weekly. Pt sometimes gets constipated as a SE from the Ozempic  - she takes Miralax  about once daily. Additionally, on the Ozempic , pt admits that there are times where she's not hungry, skip meals, and will later snack on the wrong foods.   Pt okayed to take Miralax  twice daily if constipation worsens. Discussed the importance of proper hydration and continuing exercise regiment to further help with constipation. Pt agrees to not skip meals and will work on getting in all her proteins. Continue Ozempic  & Metformin ; no dose change.   Orders: -     Ozempic  (0.25 or 0.5 MG/DOSE); Inject 0.25 mg into the skin once a week.  Dispense: 2 mL; Refill: 0   Low platelet count (HCC) Assessment & Plan: This condition has been ongoing for several years now. About 10 years ago, pt saw hematology. They did workup and no pathology was appreciated and no further workup was needed either. 3 months ago, her platelets and WBC were 137 and 3.20 respectively. This is not uncommon for her. However, if levels continue to trend down, we recommend possible hematology evaluation. Pt will continue to f/up with PCP and obtain repeat CBC in the future.    Seasonal allergies Assessment & Plan: Pt is on Xyzal  5 mg daily, which has helped alleviate her symptoms of sinus inflammation, head congestion, and runny nose. Requests refill of med. She will continue Xyzal  and her low-inflammatory diet.   Orders: -     Levocetirizine Dihydrochloride ; Take 1 tablet (5 mg total) by mouth every evening.  Dispense: 90  tablet; Refill: 0   Follow up:   Return 09/06/2023. She was informed of the importance of frequent follow up visits to maximize her success with intensive lifestyle modifications for her multiple health conditions.  Subjective:   Chief complaint: Obesity [name] is here to discuss her progress with her obesity  treatment plan. She is on the the [facility] and states she is following her eating plan approximately 80% of the time. She states she is going to the [facility] 45 minutes, 3 days per week.   Interval History:  [name] is here for a follow up office visit. Since last OV on 06/28/23, [name] is up 2 lbs. She endorses eating foods rich in fats and sugars for several days over the holiday period. She is somewhat back on track and has removed the unhealthy foods out of her home. On the Semaglutide , she admits that there are times where she's not hungry and will later snack on the wrong foods.   Pharmacotherapy for weight loss: She is currently taking  Metformin  500 mg twice daily and Semaglutide  0.25 mg once weekly .   Review of Systems:  Pertinent positives were addressed with patient today.  Reviewed by clinician on day of visit: allergies, medications, problem list, medical history, surgical history, family history, social history, and previous encounter notes.  Weight Summary and Biometrics   Weight Lost Since Last Visit: 0lb  Weight Gained Since Last Visit: 2lb   Vitals Temp: 97.8 F (36.6 C) BP: 139/87 Pulse Rate: 77 SpO2: 99 %   Anthropometric Measurements Height: 5' 3 (1.6 m) Weight: 201 lb (91.2 kg) BMI (Calculated): 35.61 Weight at Last Visit: 199lb Weight Lost Since Last Visit: 0lb Weight Gained Since Last Visit: 2lb Starting Weight: 223lb Total Weight Loss (lbs): 22 lb (9.979 kg) Peak Weight: 230lb   Body Composition  Body Fat %: 44.7 % Fat Mass (lbs): 90 lbs Muscle Mass (lbs): 105.6 lbs Total Body Water (lbs): 76.6 lbs Visceral Fat Rating : 15   Other Clinical Data Fasting: no Labs: no Today's Visit #: 79 Starting Date: 08/31/17   Objective:   PHYSICAL EXAM: Blood pressure 139/87, pulse 77, temperature 97.8 F (36.6 C), height 5' 3 (1.6 m), weight 201 lb (91.2 kg), SpO2 99%. Body mass index is 35.61 kg/m.  General: she is overweight,  cooperative and in no acute distress. PSYCH: Has normal mood, affect and thought process.   HEENT: EOMI, sclerae are anicteric. Lungs: Normal breathing effort, no conversational dyspnea. Extremities: Moves * 4 Neurologic: A and O * 3, good insight  DIAGNOSTIC DATA REVIEWED: BMET    Component Value Date/Time   NA 142 01/05/2022 0824   K 3.6 01/05/2022 0824   CL 102 01/05/2022 0824   CO2 27 01/05/2022 0824   GLUCOSE 121 (H) 01/05/2022 0824   GLUCOSE 134 (H) 09/12/2017 2118   BUN 14 01/05/2022 0824   CREATININE 0.94 01/05/2022 0824   CREATININE 0.90 03/23/2016 1116   CALCIUM  10.6 (H) 01/05/2022 0824   GFRNONAA 55 (L) 07/10/2020 0940   GFRAA 63 07/10/2020 0940   Lab Results  Component Value Date   HGBA1C 6.3 (H) 01/05/2022   HGBA1C (H) 08/31/2010    6.0 (NOTE)  According to the ADA Clinical Practice Recommendations for 2011, when HbA1c is used as a screening test:   >=6.5%   Diagnostic of Diabetes Mellitus           (if abnormal result  is confirmed)  5.7-6.4%   Increased risk of developing Diabetes Mellitus  References:Diagnosis and Classification of Diabetes Mellitus,Diabetes Care,2011,34(Suppl 1):S62-S69 and Standards of Medical Care in         Diabetes - 2011,Diabetes Care,2011,34  (Suppl 1):S11-S61.   Lab Results  Component Value Date   INSULIN  25.2 (H) 01/05/2022   INSULIN  20.4 08/31/2017   Lab Results  Component Value Date   TSH 1.680 08/31/2017   CBC    Component Value Date/Time   WBC 4.4 09/12/2017 2118   RBC 4.72 09/12/2017 2118   HGB 15.0 09/12/2017 2118   HGB 14.0 08/31/2017 1140   HCT 43.7 09/12/2017 2118   HCT 41.2 08/31/2017 1140   PLT 173 09/12/2017 2118   MCV 92.6 09/12/2017 2118   MCV 92 08/31/2017 1140   MCH 31.8 09/12/2017 2118   MCHC 34.3 09/12/2017 2118   RDW 13.1 09/12/2017 2118   RDW 13.7 08/31/2017 1140   Iron Studies No results found for: IRON, TIBC, FERRITIN,  IRONPCTSAT Lipid Panel     Component Value Date/Time   CHOL 149 07/14/2021 0954   TRIG 79 07/14/2021 0954   HDL 63 07/14/2021 0954   CHOLHDL 2.4 07/14/2021 0954   CHOLHDL 2.6 08/31/2010 0540   VLDL 12 08/31/2010 0540   LDLCALC 71 07/14/2021 0954   Hepatic Function Panel     Component Value Date/Time   PROT 7.5 01/05/2022 0824   ALBUMIN 4.8 (H) 01/05/2022 0824   AST 19 01/05/2022 0824   ALT 19 01/05/2022 0824   ALKPHOS 51 01/05/2022 0824   BILITOT 0.4 01/05/2022 0824      Component Value Date/Time   TSH 1.680 08/31/2017 1140   Nutritional Lab Results  Component Value Date   VD25OH 54.5 04/19/2023   VD25OH 59.2 01/05/2022   VD25OH 50.3 07/14/2021    Attestations:   I, Special Puri, acting as a stage manager for [employer], DO., have compiled all relevant documentation for today's office visit on behalf of Molly Jenkins, DO, while in the presence of [employer], DO.  I have reviewed the above documentation for accuracy and completeness, and I agree with the above. Molly JINNY [name], D.O.  The 21st Century Cures Act was signed into law in 2016 which includes the topic of electronic health records.  This provides immediate access to information in MyChart.  This includes consultation notes, operative notes, office notes, lab results and pathology reports.  If you have any questions about what you read please let us  know at your next visit so we can discuss your concerns and take corrective action if need be.  We are right here with you.

## 2023-09-05 ENCOUNTER — Other Ambulatory Visit (INDEPENDENT_AMBULATORY_CARE_PROVIDER_SITE_OTHER): Payer: Self-pay | Admitting: Family Medicine

## 2023-09-06 ENCOUNTER — Ambulatory Visit (INDEPENDENT_AMBULATORY_CARE_PROVIDER_SITE_OTHER): Payer: Medicare HMO | Admitting: Adult Health

## 2023-09-06 ENCOUNTER — Encounter (INDEPENDENT_AMBULATORY_CARE_PROVIDER_SITE_OTHER): Payer: Self-pay | Admitting: Adult Health

## 2023-09-06 DIAGNOSIS — E669 Obesity, unspecified: Secondary | ICD-10-CM

## 2023-09-06 DIAGNOSIS — D696 Thrombocytopenia, unspecified: Secondary | ICD-10-CM | POA: Diagnosis not present

## 2023-09-06 DIAGNOSIS — Z7984 Long term (current) use of oral hypoglycemic drugs: Secondary | ICD-10-CM

## 2023-09-06 DIAGNOSIS — R7989 Other specified abnormal findings of blood chemistry: Secondary | ICD-10-CM | POA: Diagnosis not present

## 2023-09-06 DIAGNOSIS — E1169 Type 2 diabetes mellitus with other specified complication: Secondary | ICD-10-CM | POA: Diagnosis not present

## 2023-09-06 DIAGNOSIS — E559 Vitamin D deficiency, unspecified: Secondary | ICD-10-CM | POA: Diagnosis not present

## 2023-09-06 DIAGNOSIS — Z7985 Long-term (current) use of injectable non-insulin antidiabetic drugs: Secondary | ICD-10-CM

## 2023-09-06 DIAGNOSIS — Z6834 Body mass index (BMI) 34.0-34.9, adult: Secondary | ICD-10-CM

## 2023-09-06 DIAGNOSIS — Z6835 Body mass index (BMI) 35.0-35.9, adult: Secondary | ICD-10-CM

## 2023-09-06 MED ORDER — OZEMPIC (0.25 OR 0.5 MG/DOSE) 2 MG/1.5ML ~~LOC~~ SOPN
0.2500 mg | PEN_INJECTOR | SUBCUTANEOUS | 0 refills | Status: DC
Start: 2023-09-06 — End: 2023-10-04

## 2023-09-06 NOTE — Progress Notes (Signed)
 WEIGHT SUMMARY AND BIOMETRICS  Vitals Temp: 97.6 F (36.4 C) BP: 130/81 Pulse Rate: 93 SpO2: 98 %   Anthropometric Measurements Height: 5\' 3"  (1.6 m) Weight: 196 lb (88.9 kg) BMI (Calculated): 34.73 Weight at Last Visit: 201lb Weight Lost Since Last Visit: 5lb Weight Gained Since Last Visit: 0 Starting Weight: 223lb Total Weight Loss (lbs): 27 lb (12.2 kg) Peak Weight: 230lb   Body Composition  Body Fat %: 43.6 % Fat Mass (lbs): 85.6 lbs Muscle Mass (lbs): 105.2 lbs Total Body Water (lbs): 73.2 lbs Visceral Fat Rating : 14   Other Clinical Data Fasting: no Labs: no Today's Visit #: 80 Starting Date: 08/31/17    Chief Complaint:   OBESITY Molly [name] is here to discuss her progress with her obesity treatment plan. She is on the the [facility] and states she is following her eating plan approximately 80 % of the time.  She states she is exercising Yoga 45 minutes 3 times per week.   Interim History:  Started on loading dose Ozempic  0.25mg  on/about 06/28/2023 She was on continued on 0.25mg  strength on 08/05/2023 PCP manages Metformin  500mg  1 tab with lunch and 1 tab with dinner  Reviewed Bioempedence results with pt: Muscle Mass: -0.4 lb Adipose Mass:- 4.4 lbs  She was seen by her [facility] PCP 07/12/2023- fasting labs completed- reviewed in Care Everywhere  Subjective:   1. Low platelet count (HCC) 07/12/2023  Platelet Count (PLT) 150 - 450 10*3/uL 137 Low   PCP completed labs and placed a hematology referral- she will be seen in Spring 2025  She is not on any anti platelet therapy  2. Elevated liver function tests She denies RUQ She is on daily Crestor  10mg  07/12/2023 Liver Fx Panel Component Ref Range & Units 1 mo ago  Albumin 3.5 - 5.7 g/dL 4.4  Bilirubin, Total 0.3 - 1.0 mg/dL 0.5  Bilirubin, Direct 0.0 - 0.2 mg/dL 0.1  Alkaline Phosphatase (ALP) 34 - 104 U/L 49  Aspartate Aminotransferase (AST) 13 - 39 U/L 17  Alanine  Aminotransferase (ALT) 7 - 52 U/L 12  Total Protein 6.4 - 8.9 g/dL 7.1   3. Type 2 diabetes mellitus with obesity (HCC) Started on loading dose Ozempic  0.25mg  on/about 06/28/2023 She was on continued on 0.25mg  strength on 08/05/2023 PCP manages Metformin  500mg  1 tab with lunch and 1 tab with dinner She reports fasting CBG will range from upper 90s to lower 100s She denies sx's of hypoglycemia Denies mass in neck, dysphagia, dyspepsia, persistent hoarseness, abdominal pain, or N/V She is treating constipation with one to two doses of OTC Miralax   4. Vitamin D  deficiency  Latest Reference Range & Units 01/20/21 09:36 07/14/21 09:54 01/05/22 08:24 04/19/23 11:14  Vitamin D , 25-Hydroxy 30.0 - 100.0 ng/mL 48.9 50.3 59.2 54.5   Level stable  Assessment/Plan:   1. Low platelet count (HCC) F/u with PCP and Hematologist   2. Elevated liver function tests Avoid Nephrotoxic substances Monitor Labs  3. Type 2 diabetes mellitus with obesity (HCC) Refill   Semaglutide ,0.25 or 0.5MG /DOS, (OZEMPIC , 0.25 OR 0.5 MG/DOSE,) 2 MG/1.5ML SOPN Inject 0.25 mg into the skin once a week. Dispense: 2 mL, Refills: 0 ordered   09/06/2023 -- Teaghan Formica, Acie Holiday D, NP                             4. Vitamin D  deficiency Monitor Labs  5. BMI 35.0-35.9,adult-current 34.73   Semaglutide ,0.25 or 0.5MG /DOS, (  OZEMPIC , 0.25 OR 0.5 MG/DOSE,) 2 MG/1.5ML SOPN Inject 0.25 mg into the skin once a week. Dispense: 2 mL, Refills: 0 ordered   [name] is currently in the action stage of change. As such, her goal is to continue with weight loss efforts. She has agreed to the [employer].   Exercise goals: Older adults should follow the adult guidelines. When older adults cannot meet the adult guidelines, they should be as physically active as their abilities and conditions will allow.  Older adults should do exercises that maintain or improve balance if they are at risk of falling.  Older adults should determine their level of  effort for physical activity relative to their level of fitness.  Older adults with chronic conditions should understand whether and how their conditions affect their ability to do regular physical activity safely.  Behavioral modification strategies: increasing lean protein intake, decreasing simple carbohydrates, increasing vegetables, increasing water intake, no skipping meals, meal planning and cooking strategies, keeping healthy foods in the home, and planning for success.  Gladiola has agreed to follow-up with our clinic in 4 weeks. She was informed of the importance of frequent follow-up visits to maximize her success with intensive lifestyle modifications for her multiple health conditions.   Objective:   Blood pressure 130/81, pulse 93, temperature 97.6 F (36.4 C), height 5\' 3"  (1.6 m), weight 196 lb (88.9 kg), SpO2 98%. Body mass index is 34.72 kg/m.  General: Cooperative, alert, well developed, in no acute distress. HEENT: Conjunctivae and lids unremarkable. Cardiovascular: Regular rhythm.  Lungs: Normal work of breathing. Neurologic: No focal deficits.   Lab Results  Component Value Date   CREATININE 0.94 01/05/2022   BUN 14 01/05/2022   NA 142 01/05/2022   K 3.6 01/05/2022   CL 102 01/05/2022   CO2 27 01/05/2022   Lab Results  Component Value Date   ALT 19 01/05/2022   AST 19 01/05/2022   ALKPHOS 51 01/05/2022   BILITOT 0.4 01/05/2022   Lab Results  Component Value Date   HGBA1C 6.3 (H) 01/05/2022   HGBA1C 6.1 (H) 07/14/2021   HGBA1C 6.3 (H) 01/20/2021   HGBA1C 6.2 (H) 07/10/2020   HGBA1C 5.9 (H) 12/14/2019   Lab Results  Component Value Date   INSULIN  25.2 (H) 01/05/2022   INSULIN  18.4 07/14/2021   INSULIN  17.6 01/20/2021   INSULIN  12.0 07/10/2020   INSULIN  16.0 12/14/2019   Lab Results  Component Value Date   TSH 1.680 08/31/2017   Lab Results  Component Value Date   CHOL 149 07/14/2021   HDL 63 07/14/2021   LDLCALC 71 07/14/2021   TRIG 79  07/14/2021   CHOLHDL 2.4 07/14/2021   Lab Results  Component Value Date   VD25OH 54.5 04/19/2023   VD25OH 59.2 01/05/2022   VD25OH 50.3 07/14/2021   Lab Results  Component Value Date   WBC 4.4 09/12/2017   HGB 15.0 09/12/2017   HCT 43.7 09/12/2017   MCV 92.6 09/12/2017   PLT 173 09/12/2017   No results found for: "IRON", "TIBC", "FERRITIN"  Attestation Statements:   Reviewed by clinician on day of visit: allergies, medications, problem list, medical history, surgical history, family history, social history, and previous encounter notes.  I have reviewed the above documentation for accuracy and completeness, and I agree with the above. -  Jaimin Krupka d. Andreea Arca, NP-C

## 2023-10-02 ENCOUNTER — Other Ambulatory Visit (INDEPENDENT_AMBULATORY_CARE_PROVIDER_SITE_OTHER): Payer: Self-pay | Admitting: Adult Health

## 2023-10-04 ENCOUNTER — Encounter (INDEPENDENT_AMBULATORY_CARE_PROVIDER_SITE_OTHER): Payer: Self-pay | Admitting: Adult Health

## 2023-10-04 ENCOUNTER — Ambulatory Visit (INDEPENDENT_AMBULATORY_CARE_PROVIDER_SITE_OTHER): Payer: Medicare HMO | Admitting: Adult Health

## 2023-10-04 VITALS — BP 165/93 | HR 78 | Temp 98.0°F | Ht 63.0 in | Wt 199.0 lb

## 2023-10-04 DIAGNOSIS — R7989 Other specified abnormal findings of blood chemistry: Secondary | ICD-10-CM

## 2023-10-04 DIAGNOSIS — Z6835 Body mass index (BMI) 35.0-35.9, adult: Secondary | ICD-10-CM

## 2023-10-04 DIAGNOSIS — J302 Other seasonal allergic rhinitis: Secondary | ICD-10-CM

## 2023-10-04 DIAGNOSIS — E669 Obesity, unspecified: Secondary | ICD-10-CM

## 2023-10-04 DIAGNOSIS — E1169 Type 2 diabetes mellitus with other specified complication: Secondary | ICD-10-CM | POA: Diagnosis not present

## 2023-10-04 DIAGNOSIS — I152 Hypertension secondary to endocrine disorders: Secondary | ICD-10-CM | POA: Diagnosis not present

## 2023-10-04 DIAGNOSIS — E1159 Type 2 diabetes mellitus with other circulatory complications: Secondary | ICD-10-CM | POA: Diagnosis not present

## 2023-10-04 DIAGNOSIS — Z7984 Long term (current) use of oral hypoglycemic drugs: Secondary | ICD-10-CM

## 2023-10-04 DIAGNOSIS — Z7985 Long-term (current) use of injectable non-insulin antidiabetic drugs: Secondary | ICD-10-CM

## 2023-10-04 MED ORDER — OZEMPIC (0.25 OR 0.5 MG/DOSE) 2 MG/1.5ML ~~LOC~~ SOPN: 0.2500 mg | PEN_INJECTOR | SUBCUTANEOUS | 0 refills | Status: DC

## 2023-10-04 NOTE — Progress Notes (Signed)
 WEIGHT SUMMARY AND BIOMETRICS  Vitals Temp: 98 F (36.7 C) BP: (!) 165/93 (not taken meds yet) Pulse Rate: 78 SpO2: 99 %   Anthropometric Measurements Height: 5\' 3"  (1.6 m) Weight: 199 lb (90.3 kg) BMI (Calculated): 35.26 Weight at Last Visit: 196 lb Weight Lost Since Last Visit: 0 Weight Gained Since Last Visit: 3 lb Starting Weight: 223 lb Total Weight Loss (lbs): 24 lb (10.9 kg) Peak Weight: 230 lb   Body Composition  Body Fat %: 44.1 % Fat Mass (lbs): 87.8 lbs Muscle Mass (lbs): 105.8 lbs Total Body Water (lbs): 76.2 lbs Visceral Fat Rating : 15   Other Clinical Data Fasting: no Labs: no Today's Visit #: 68 Starting Date: 08/31/17    Chief Complaint:   OBESITY Molly [name] is here to discuss her progress with her obesity treatment plan.  She is on the the Category 2 Plan and states she is following her eating plan approximately 50 % of the time.  She states she is exercising Yoga at [facility] or on YouTube 30-45 minutes 3 times per week.   Interim History:  Molly [name] reports recent URI- nasal congestion, non-productive cough, and fatigue. Sx's started beginning of March- lastesd 4-7 days. She denies current acute sx's. She have hx of seasonal allergies. She feels that Spring causes most sigifianct seasonal allergy sx's.  Hunger/appetite-low appetite- on weekly loading dose of Ozempic 0.25mg  since 06/28/2023 Remained on loading dose due to constipation and low appetite.  Exercise-Yoga Practice- either at [facility] or at home via YouTube  Subjective:   1. Hypertension associated with type 2 diabetes mellitus (HCC) BP above goal at OV Molly [name] has NOT taken her daily antihypertensive therapy prior to OV Her PCP manages daily Amlodipine 10 mg, Losartan 100mg , and triamterene-hydroCHLOROthiazide (DYAZIDE) 37.5-25  eGFR 59 (L) >59 mL/min/1.46m2   08/10/2023 12:17 PM EST   She denies acute cardiac sx's at present  2. Type 2 diabetes mellitus with obesity  (HCC) Lab Results  Component Value Date   HGBA1C 6.3 (H) 01/05/2022   HGBA1C 6.1 (H) 07/14/2021   HGBA1C 6.3 (H) 01/20/2021    Home fasting CBG: 100s, PP CBG: 130s She denies sx's of hypoglycemia PCP manages Metformin 500mg  BID and HWW manages weekly Ozempic 0.25mg  She has been on loading dose Ozempic 0.25mg  on/about 06/28/2023 Denies mass in neck, dysphagia, dyspepsia, persistent hoarseness, abdominal pain, or N/V/Worsening Constipation She has been on daily to BID Miralax- will have daily BM She denies hematochezia  3. Seasonal allergies She have hx of seasonal allergies. She feels that Spring causes most sigifianct seasonal allergy sx's. She is on daily Xyzal 5mg  She states "all I drink is water"  4. Elevated liver function tests She denies RUQ pain  06/25/2022 US Abdominal Limited IMPRESSION:  Diffuse increased echogenicity of the hepatic parenchyma is a  nonspecific indicator of hepatocellular dysfunction, most commonly  steatosis.   Assessment/Plan:   1. Hypertension associated with type 2 diabetes mellitus (HCC) Take ALL antihypertensives as directed. Increase water and limit Na+ Monitor home BP  2. Type 2 diabetes mellitus with obesity (HCC) (Primary) Refill Semaglutide,0.25 or 0.5MG /DOS, (OZEMPIC, 0.25 OR 0.5 MG/DOSE,) 2 MG/1.5ML SOPN Inject 0.25 mg into the skin once a week. Dispense: 2 mL, Refills: 0 ordered   3. Seasonal allergies Remain well hydrated with water Limit exposure to known allergens  4. Elevated liver function tests Continue with weight loss efforts  5. BMI 35.0-35.9,adult-CURRENT BMI 35.3  Molly [name] is currently in the action stage  of change. As such, her goal is to continue with weight loss efforts. She has agreed to the Category 2 Plan.   Exercise goals: Older adults should follow the adult guidelines. When older adults cannot meet the adult guidelines, they should be as physically active as their abilities and conditions will allow.  Older  adults should do exercises that maintain or improve balance if they are at risk of falling.  Older adults should determine their level of effort for physical activity relative to their level of fitness.  Older adults with chronic conditions should understand whether and how their conditions affect their ability to do regular physical activity safely.  Behavioral modification strategies: increasing lean protein intake, decreasing simple carbohydrates, increasing vegetables, increasing water intake, no skipping meals, meal planning and cooking strategies, keeping healthy foods in the home, and planning for success.  Molly [name] has agreed to follow-up with our clinic in 4 weeks. She was informed of the importance of frequent follow-up visits to maximize her success with intensive lifestyle modifications for her multiple health conditions.   Check Fasting Labs at next OV  Objective:   Blood pressure (!) 165/93, pulse 78, temperature 98 F (36.7 C), height 5\' 3"  (1.6 m), weight 199 lb (90.3 kg), SpO2 99%. Body mass index is 35.25 kg/m.  General: Cooperative, alert, well developed, in no acute distress. HEENT: Conjunctivae and lids unremarkable. Cardiovascular: Regular rhythm.  Lungs: Normal work of breathing. Neurologic: No focal deficits.   Lab Results  Component Value Date   CREATININE 0.94 01/05/2022   BUN 14 01/05/2022   NA 142 01/05/2022   K 3.6 01/05/2022   CL 102 01/05/2022   CO2 27 01/05/2022   Lab Results  Component Value Date   ALT 19 01/05/2022   AST 19 01/05/2022   ALKPHOS 51 01/05/2022   BILITOT 0.4 01/05/2022   Lab Results  Component Value Date   HGBA1C 6.3 (H) 01/05/2022   HGBA1C 6.1 (H) 07/14/2021   HGBA1C 6.3 (H) 01/20/2021   HGBA1C 6.2 (H) 07/10/2020   HGBA1C 5.9 (H) 12/14/2019   Lab Results  Component Value Date   INSULIN 25.2 (H) 01/05/2022   INSULIN 18.4 07/14/2021   INSULIN 17.6 01/20/2021   INSULIN 12.0 07/10/2020   INSULIN 16.0 12/14/2019   Lab  Results  Component Value Date   TSH 1.680 08/31/2017   Lab Results  Component Value Date   CHOL 149 07/14/2021   HDL 63 07/14/2021   LDLCALC 71 07/14/2021   TRIG 79 07/14/2021   CHOLHDL 2.4 07/14/2021   Lab Results  Component Value Date   VD25OH 54.5 04/19/2023   VD25OH 59.2 01/05/2022   VD25OH 50.3 07/14/2021   Lab Results  Component Value Date   WBC 4.4 09/12/2017   HGB 15.0 09/12/2017   HCT 43.7 09/12/2017   MCV 92.6 09/12/2017   PLT 173 09/12/2017   No results found for: "IRON", "TIBC", "FERRITIN"  Attestation Statements:   Reviewed by clinician on day of visit: allergies, medications, problem list, medical history, surgical history, family history, social history, and previous encounter notes.  I have reviewed the above documentation for accuracy and completeness, and I agree with the above. -  Elijah Phommachanh d. Damont Balles, NP-C

## 2023-11-01 ENCOUNTER — Other Ambulatory Visit (INDEPENDENT_AMBULATORY_CARE_PROVIDER_SITE_OTHER): Payer: Self-pay | Admitting: Family Medicine

## 2023-11-01 DIAGNOSIS — J302 Other seasonal allergic rhinitis: Secondary | ICD-10-CM

## 2023-11-06 ENCOUNTER — Other Ambulatory Visit (INDEPENDENT_AMBULATORY_CARE_PROVIDER_SITE_OTHER): Payer: Self-pay | Admitting: Adult Health

## 2023-11-09 ENCOUNTER — Ambulatory Visit (INDEPENDENT_AMBULATORY_CARE_PROVIDER_SITE_OTHER): Admitting: Family Medicine

## 2023-12-01 ENCOUNTER — Ambulatory Visit (INDEPENDENT_AMBULATORY_CARE_PROVIDER_SITE_OTHER): Admitting: Family Medicine

## 2023-12-01 ENCOUNTER — Encounter (INDEPENDENT_AMBULATORY_CARE_PROVIDER_SITE_OTHER): Payer: Self-pay | Admitting: Family Medicine

## 2023-12-01 VITALS — BP 155/85 | HR 76 | Temp 98.4°F | Ht 63.0 in | Wt 198.0 lb

## 2023-12-01 DIAGNOSIS — E559 Vitamin D deficiency, unspecified: Secondary | ICD-10-CM | POA: Diagnosis not present

## 2023-12-01 DIAGNOSIS — E1159 Type 2 diabetes mellitus with other circulatory complications: Secondary | ICD-10-CM

## 2023-12-01 DIAGNOSIS — E1169 Type 2 diabetes mellitus with other specified complication: Secondary | ICD-10-CM | POA: Diagnosis not present

## 2023-12-01 DIAGNOSIS — I152 Hypertension secondary to endocrine disorders: Secondary | ICD-10-CM | POA: Diagnosis not present

## 2023-12-01 DIAGNOSIS — Z6835 Body mass index (BMI) 35.0-35.9, adult: Secondary | ICD-10-CM

## 2023-12-01 DIAGNOSIS — Z7985 Long-term (current) use of injectable non-insulin antidiabetic drugs: Secondary | ICD-10-CM

## 2023-12-01 MED ORDER — OZEMPIC (0.25 OR 0.5 MG/DOSE) 2 MG/1.5ML ~~LOC~~ SOPN: 0.2500 mg | PEN_INJECTOR | SUBCUTANEOUS | 0 refills | Status: DC

## 2023-12-01 NOTE — Progress Notes (Signed)
 Rae Bugler, D.O.  ABFM, ABOM Specializing in Clinical Bariatric Medicine  Office located at: [address] [place], [place]  [address]   Assessment and Plan:   Orders Placed This Encounter  Procedures   Lipid panel   Magnesium   Vitamin B12   Hemoglobin A1c   Comprehensive metabolic panel with GFR   VITAMIN D  25 Hydroxy (Vit-D Deficiency, Fractures)   Medications Discontinued During This Encounter  Medication Reason   Semaglutide ,0.25 or 0.5MG /DOS, (OZEMPIC , 0.25 OR 0.5 MG/DOSE,) 2 MG/1.5ML SOPN Reorder    Meds ordered this encounter  Medications   Semaglutide ,0.25 or 0.5MG /DOS, (OZEMPIC , 0.25 OR 0.5 MG/DOSE,) 2 MG/1.5ML SOPN    Sig: Inject 0.25 mg into the skin once a week.    Dispense:  4 mL    Refill:  0     FOR THE DISEASE OF OBESITY:  BMI 35.0-35.9,adult-CURRENT BMI 35.08 Morbid obesity (HCC)-start bmi 39.50/date 08/31/2017 Assessment & Plan: Since last office visit on 10/04/2023 patient's  Muscle mass has decreased by 2.8 lbs. Fat mass has increased by 2 lbs. Total body water has decreased by 0.6 lbs.  Counseling done on how various foods will affect these numbers and how to maximize success  Total lbs lost to date: 25 lbs Total weight loss percentage to date: 11.21%    Recommended Dietary Goals Fiorela is currently in the action stage of change. As such, her goal is to continue weight management plan.  She has agreed to: continue current plan   Behavioral Intervention We discussed the following today: Making healthy choices when eating out, increasing lean protein intake to established goals and continue to practice mindfulness when eating  Additional resources provided today: Handout on balanced plate concepts.    Evidence-based interventions for health behavior change were utilized today including the discussion of self monitoring techniques, problem-solving barriers and SMART goal setting techniques.   Regarding patient's less desirable eating  habits and patterns, we employed the technique of small changes.   Pt will specifically work on: n/a   Recommended Physical Activity Goals Alize has been advised to work up to 300-450 minutes of moderate intensity aerobic activity a week and strengthening exercises 2-3 times per week for cardiovascular health, weight loss maintenance and preservation of muscle mass.   She has agreed to :  Continue current level of physical activity    Pharmacotherapy We both agreed to : continue with nutritional and behavioral strategies and current medication regimen   ASSOCIATED CONDITIONS ADDRESSED TODAY:  Type 2 diabetes mellitus with obesity (HCC) Assessment & Plan: Beni is on Metformin  500 mg twice daily and Ozempic  0.25 mg once weekly. She is tolerating both medications well. Pt is taking Miralax  to manage constipation, but otherwise no GI upset reported. Intensive lifestyle modification including diet, exercise and weight loss are the first line of treatment for diabetes. We extensively discussed the importance of decreasing simple carbs and how certain foods they eat will affect their blood sugars. Reminded Kahlilah Epp if she feels poorly- check Blood Sugar and Blood Pressure at that time. Continue closely following RCNP. Will recheck A1c today.   Relevant Orders: -     Lipid panel -     Ozempic  (0.25 or 0.5 MG/DOSE); Inject 0.25 mg into the skin once a week.  Dispense: 4 mL; Refill: 0 -     Hemoglobin A1c   Hypertension associated with type 2 diabetes mellitus (HCC) Assessment & Plan: BP Readings from Last 3 Encounters:  12/01/23 Aaron Aas)  155/85  10/04/23 (!) 165/93  09/06/23 130/81  Kaziyah is taking Cozaar  100 mg, Norvasc  10 mg, and Dyazide 37.5-25 mg daily. BP is above goal today at 155/85, pt reports BP was 160/80 this morning. She endorses checking BP 1-2 times daily with an average of 130s/70-80s. Goal BP 140/90 or lower. Recommended pt consult with PCP about condition if levels are  consistently above goal at home. Continue following heart-healthy, low sodium diet. Continue ambulatory BP monitoring. Will continue to monitor closely alongside PCP.  Relevant Orders: -     Magnesium -     Vitamin B12 -     Comprehensive metabolic panel with GFR   Vitamin D  deficiency Assessment & Plan: Lab Results  Component Value Date   VD25OH 43.6 07/12/2023  Alvin is not taking any Vit D supplements. She has not taking this supplement since 12/2022 per PCP. Informed pt about importance of optimal Vit D levels for energy, mood, bone health, and overall health and wellbeing. Will recheck levels today to assess any changes.    Relevant Orders: -     VITAMIN D  25 Hydroxy (Vit-D Deficiency, Fractures)  Follow up:   Return in about 6 weeks (around 01/11/2024). She was informed of the importance of frequent follow up visits to maximize her success with intensive lifestyle modifications for her multiple health conditions.  Vanassa Schlink is aware that we will review all of her lab results at our next visit together in person.  She is aware that if anything is critical/ life threatening with the results, we will be contacting her via MyChart or by my CMA will be calling them prior to the office visit to discuss acute management.    Subjective:   Chief complaint: Obesity Warner is here to discuss her progress with her obesity treatment plan. She is on the the Category 2 Plan and states she is following her eating plan approximately 75% of the time. She states she is doing yoga at [facility] 45 minutes 3 days per week.  Interval History:  Shyann Yannuzzi is here for a follow up office visit. Since last OV on 10/04/2023, pt is down 1 lb. She reports going to [employer], celebrating Easter, and several birthdays where she ate off plan. Additionally, pt is restarting swim classes soon.   Pharmacotherapy for weight loss: She is currently taking Metformin  500 mg twice daily and Ozempic  0.25 mg once weekly.    Review of Systems:  Pertinent positives were addressed with patient today.  Reviewed by clinician on day of visit: allergies, medications, problem list, medical history, surgical history, family history, social history, and previous encounter notes.  Weight Summary and Biometrics   Weight Lost Since Last Visit: 1lb  Weight Gained Since Last Visit: 0lb   Vitals Temp: 98.4 F (36.9 C) BP: (!) 155/85 Pulse Rate: 76 SpO2: 99 %   Anthropometric Measurements Height: 5\' 3"  (1.6 m) Weight: 198 lb (89.8 kg) BMI (Calculated): 35.08 Weight at Last Visit: 199lb Weight Lost Since Last Visit: 1lb Weight Gained Since Last Visit: 0lb Starting Weight: 223lb Total Weight Loss (lbs): 25 lb (11.3 kg) Peak Weight: 230lb   Body Composition  Body Fat %: 45.3 % Fat Mass (lbs): 89.8 lbs Muscle Mass (lbs): 103 lbs Total Body Water (lbs): 75.6 lbs Visceral Fat Rating : 15   Other Clinical Data Fasting: Yes Labs: Yes Today's Visit #: 82 Starting Date: 08/31/17    Objective:   PHYSICAL EXAM: Blood pressure (!) 155/85, pulse 76, temperature 98.4  F (36.9 C), height 5\' 3"  (1.6 m), weight 198 lb (89.8 kg), SpO2 99%. Body mass index is 35.07 kg/m.  General: she is overweight, cooperative and in no acute distress. PSYCH: Has normal mood, affect and thought process.   HEENT: EOMI, sclerae are anicteric. Lungs: Normal breathing effort, no conversational dyspnea. Extremities: Moves * 4 Neurologic: A and O * 3, good insight  DIAGNOSTIC DATA REVIEWED: BMET    Component Value Date/Time   NA 142 01/05/2022 0824   K 3.6 01/05/2022 0824   CL 102 01/05/2022 0824   CO2 27 01/05/2022 0824   GLUCOSE 121 (H) 01/05/2022 0824   GLUCOSE 134 (H) 09/12/2017 2118   BUN 14 01/05/2022 0824   CREATININE 0.94 01/05/2022 0824   CREATININE 0.90 03/23/2016 1116   CALCIUM  10.6 (H) 01/05/2022 0824   GFRNONAA 55 (L) 07/10/2020 0940   GFRAA 63 07/10/2020 0940   Lab Results  Component Value Date    HGBA1C 6.3 (H) 01/05/2022   HGBA1C (H) 08/31/2010    6.0 (NOTE)                                                                       According to the ADA Clinical Practice Recommendations for 2011, when HbA1c is used as a screening test:   >=6.5%   Diagnostic of Diabetes Mellitus           (if abnormal result  is confirmed)  5.7-6.4%   Increased risk of developing Diabetes Mellitus  References:Diagnosis and Classification of Diabetes Mellitus,Diabetes Care,2011,34(Suppl 1):S62-S69 and Standards of Medical Care in         Diabetes - 2011,Diabetes Care,2011,34  (Suppl 1):S11-S61.   Lab Results  Component Value Date   INSULIN  25.2 (H) 01/05/2022   INSULIN  20.4 08/31/2017   Lab Results  Component Value Date   TSH 1.680 08/31/2017   CBC    Component Value Date/Time   WBC 4.4 09/12/2017 2118   RBC 4.72 09/12/2017 2118   HGB 15.0 09/12/2017 2118   HGB 14.0 08/31/2017 1140   HCT 43.7 09/12/2017 2118   HCT 41.2 08/31/2017 1140   PLT 173 09/12/2017 2118   MCV 92.6 09/12/2017 2118   MCV 92 08/31/2017 1140   MCH 31.8 09/12/2017 2118   MCHC 34.3 09/12/2017 2118   RDW 13.1 09/12/2017 2118   RDW 13.7 08/31/2017 1140   Iron Studies No results found for: "IRON", "TIBC", "FERRITIN", "IRONPCTSAT" Lipid Panel     Component Value Date/Time   CHOL 149 07/14/2021 0954   TRIG 79 07/14/2021 0954   HDL 63 07/14/2021 0954   CHOLHDL 2.4 07/14/2021 0954   CHOLHDL 2.6 08/31/2010 0540   VLDL 12 08/31/2010 0540   LDLCALC 71 07/14/2021 0954   Hepatic Function Panel     Component Value Date/Time   PROT 7.5 01/05/2022 0824   ALBUMIN 4.8 (H) 01/05/2022 0824   AST 19 01/05/2022 0824   ALT 19 01/05/2022 0824   ALKPHOS 51 01/05/2022 0824   BILITOT 0.4 01/05/2022 0824      Component Value Date/Time   TSH 1.680 08/31/2017 1140   Nutritional Lab Results  Component Value Date   VD25OH 54.5 04/19/2023   VD25OH 59.2 01/05/2022   VD25OH 50.3 07/14/2021  Attestations:   I, Camryn Mix,  acting as a Stage manager for [employer], DO., have compiled all relevant documentation for today's office visit on behalf of Marceil Sensor, DO, while in the presence of [employer], DO.  I have reviewed the above documentation for accuracy and completeness, and I agree with the above. Rae Bugler, D.O.  The 21st Century Cures Act was signed into law in 2016 which includes the topic of electronic health records.  This provides immediate access to information in MyChart.  This includes consultation notes, operative notes, office notes, lab results and pathology reports.  If you have any questions about what you read please let us  know at your next visit so we can discuss your concerns and take corrective action if need be.  We are right here with you.

## 2023-12-02 LAB — COMPREHENSIVE METABOLIC PANEL WITH GFR
ALT: 12 IU/L (ref 0–32)
AST: 21 IU/L (ref 0–40)
Albumin: 4.7 g/dL (ref 3.8–4.8)
Alkaline Phosphatase: 59 IU/L (ref 44–121)
BUN/Creatinine Ratio: 13 (ref 12–28)
BUN: 12 mg/dL (ref 8–27)
Bilirubin Total: 0.5 mg/dL (ref 0.0–1.2)
CO2: 23 mmol/L (ref 20–29)
Calcium: 10.6 mg/dL — ABNORMAL HIGH (ref 8.7–10.3)
Chloride: 101 mmol/L (ref 96–106)
Creatinine, Ser: 0.93 mg/dL (ref 0.57–1.00)
Globulin, Total: 2.6 g/dL (ref 1.5–4.5)
Glucose: 88 mg/dL (ref 70–99)
Potassium: 4.2 mmol/L (ref 3.5–5.2)
Sodium: 140 mmol/L (ref 134–144)
Total Protein: 7.3 g/dL (ref 6.0–8.5)
eGFR: 63 mL/min/{1.73_m2} (ref >59)

## 2023-12-02 LAB — MAGNESIUM: Magnesium: 2.2 mg/dL (ref 1.6–2.3)

## 2023-12-02 LAB — VITAMIN D 25 HYDROXY (VIT D DEFICIENCY, FRACTURES): Vit D, 25-Hydroxy: 45.1 ng/mL (ref 30.0–100.0)

## 2023-12-02 LAB — LIPID PANEL
Chol/HDL Ratio: 2.6 ratio (ref 0.0–4.4)
Cholesterol, Total: 169 mg/dL (ref 100–199)
HDL: 65 mg/dL (ref >39)
LDL Chol Calc (NIH): 86 mg/dL (ref 0–99)
Triglycerides: 100 mg/dL (ref 0–149)
VLDL Cholesterol Cal: 18 mg/dL (ref 5–40)

## 2023-12-02 LAB — HEMOGLOBIN A1C
Est. average glucose Bld gHb Est-mCnc: 128 mg/dL
Hgb A1c MFr Bld: 6.1 % — ABNORMAL HIGH (ref 4.8–5.6)

## 2023-12-02 LAB — VITAMIN B12: Vitamin B-12: 837 pg/mL (ref 232–1245)

## 2023-12-05 ENCOUNTER — Other Ambulatory Visit (INDEPENDENT_AMBULATORY_CARE_PROVIDER_SITE_OTHER): Payer: Self-pay | Admitting: Family Medicine

## 2023-12-05 DIAGNOSIS — J302 Other seasonal allergic rhinitis: Secondary | ICD-10-CM

## 2023-12-07 ENCOUNTER — Ambulatory Visit (INDEPENDENT_AMBULATORY_CARE_PROVIDER_SITE_OTHER): Admitting: Family Medicine

## 2023-12-26 ENCOUNTER — Other Ambulatory Visit (INDEPENDENT_AMBULATORY_CARE_PROVIDER_SITE_OTHER): Payer: Self-pay | Admitting: Family Medicine

## 2023-12-26 DIAGNOSIS — I152 Hypertension secondary to endocrine disorders: Secondary | ICD-10-CM

## 2023-12-26 DIAGNOSIS — E1169 Type 2 diabetes mellitus with other specified complication: Secondary | ICD-10-CM

## 2024-01-11 ENCOUNTER — Ambulatory Visit (INDEPENDENT_AMBULATORY_CARE_PROVIDER_SITE_OTHER): Admitting: Adult Health

## 2024-01-11 ENCOUNTER — Encounter (INDEPENDENT_AMBULATORY_CARE_PROVIDER_SITE_OTHER): Payer: Self-pay | Admitting: Adult Health

## 2024-01-11 VITALS — BP 139/85 | HR 67 | Temp 98.2°F | Ht 63.0 in | Wt 194.0 lb

## 2024-01-11 DIAGNOSIS — Z7985 Long-term (current) use of injectable non-insulin antidiabetic drugs: Secondary | ICD-10-CM

## 2024-01-11 DIAGNOSIS — E1169 Type 2 diabetes mellitus with other specified complication: Secondary | ICD-10-CM

## 2024-01-11 DIAGNOSIS — E669 Obesity, unspecified: Secondary | ICD-10-CM

## 2024-01-11 DIAGNOSIS — J302 Other seasonal allergic rhinitis: Secondary | ICD-10-CM | POA: Diagnosis not present

## 2024-01-11 DIAGNOSIS — I152 Hypertension secondary to endocrine disorders: Secondary | ICD-10-CM | POA: Diagnosis not present

## 2024-01-11 DIAGNOSIS — E1159 Type 2 diabetes mellitus with other circulatory complications: Secondary | ICD-10-CM | POA: Diagnosis not present

## 2024-01-11 DIAGNOSIS — Z7984 Long term (current) use of oral hypoglycemic drugs: Secondary | ICD-10-CM

## 2024-01-11 DIAGNOSIS — Z6835 Body mass index (BMI) 35.0-35.9, adult: Secondary | ICD-10-CM

## 2024-01-11 DIAGNOSIS — E559 Vitamin D deficiency, unspecified: Secondary | ICD-10-CM

## 2024-01-11 NOTE — Progress Notes (Signed)
 WEIGHT SUMMARY AND BIOMETRICS  Vitals Temp: 98.2 F (36.8 C) BP: 139/85 (left arm) Pulse Rate: 67 SpO2: 98 %   Anthropometric Measurements Height: 5' 3 (1.6 m) Weight: 194 lb (88 kg) BMI (Calculated): 34.37 Weight at Last Visit: 198 lb Weight Lost Since Last Visit: 4 lb Weight Gained Since Last Visit: 0 Starting Weight: 223 lb Total Weight Loss (lbs): 29 lb (13.2 kg) Peak Weight: 230 lb   Body Composition  Body Fat %: 43.9 % Fat Mass (lbs): 85.4 lbs Muscle Mass (lbs): 103.4 lbs Total Body Water (lbs): 76 lbs Visceral Fat Rating : 14   Other Clinical Data Fasting: no Labs: no Today's Visit #: 46 Starting Date: 08/31/17    Chief Complaint:   OBESITY Molly [name] is here to discuss her progress with her obesity treatment plan.  She is on the the Category 2 Plan and states she is following her eating plan approximately 85 % of the time.  She states she is exercising Walking/Home Workout Videos 45 minutes 3-4 times per week.  Interim History:  PCP manages Metformin  500mg  BID with meals (lunch and dinner) HWW is managing low dose Ozempic  0.25mg  She endorses low appetite on GLP-1  Home CBG readings: Fasting hugh 80s to low 110s Post Prandial <130s She denies sx's of hypoglycemia  Subjective:   1. Seasonal allergies She reports allergy sx's are well controlled with daily Xyzal  5mg  She estimates to drink 3-4 bottles of water (16.9 oz)  2. Type 2 diabetes mellitus with obesity (HCC) Discussed Labs Vitamin B12 232 - 1,245 pg/mL 837    Latest Reference Range & Units 12/01/23 12:29  Glucose 70 - 99 mg/dL 88  Hemoglobin W0J 4.8 - 5.6 % 6.1 (H)  Est. average glucose Bld gHb Est-mCnc mg/dL 811  (H): Data is abnormally high  CBG, A1c, and Insulin  levels at goal B12 level at goal PCP manages Metformin  500mg  BID with meals (lunch and dinner) HWW is managing low dose Ozempic  0.25mg  She endorses low appetite on GLP-1 Denies mass in neck, dysphagia, dyspepsia,  persistent hoarseness, abdominal pain, or N/V/C   3. Hypertension associated with type 2 diabetes mellitus (HCC) Discussed Labs BP recheck stable at OV 12/01/2023 CMP: Electrolytes, kidney fx, Liver enzymes: normal Her home readings: SBP: 130s DBP: 70-80s She is on amLODipine  (NORVASC ) 10 MG tablet  metFORMIN  (GLUCOPHAGE ) 500 MG tablet  losartan  (COZAAR ) 100 MG tablet  triamterene-hydrochlorothiazide (DYAZIDE) 37.5-25 MG capsule  rosuvastatin  (CRESTOR ) 10 MG tablet  OZEMPIC , 0.25 OR 0.5 MG/DOSE, 2 MG/3ML SOPN   4. Vitamin D  deficiency, unspecified Discussed Labs  Latest Reference Range & Units 12/01/23 12:29  Vitamin D , 25-Hydroxy 30.0 - 100.0 ng/mL 45.1       VIT D Level stable She states I feel great She is on daily OTC MVI  Assessment/Plan:   1. Seasonal allergies Remain well hydrated and continue OTC antihistamine  2. Type 2 diabetes mellitus with obesity (HCC) (Primary) Continue daily Metformin  500mg  BID and weekly low dose Ozempic  0.25mg   3. Hypertension associated with type 2 diabetes mellitus (HCC) Limit Na+ Continue regular exercise Continue amLODipine  (NORVASC ) 10 MG tablet  metFORMIN  (GLUCOPHAGE ) 500 MG tablet  losartan  (COZAAR ) 100 MG tablet  triamterene-hydrochlorothiazide (DYAZIDE) 37.5-25 MG capsule  rosuvastatin  (CRESTOR ) 10 MG tablet  OZEMPIC , 0.25 OR 0.5 MG/DOSE, 2 MG/3ML SOPN   4. Vitamin D  deficiency, unspecified Continue current daily OTC MVI  5. BMI 35.0-35.9,adult-CURRENT BMI 35.08  [name] is currently in the action stage of change. As  such, her goal is to continue with weight loss efforts. She has agreed to the Category 2 Plan.   Exercise goals: Older adults should follow the adult guidelines. When older adults cannot meet the adult guidelines, they should be as physically active as their abilities and conditions will allow.  Older adults should do exercises that maintain or improve balance if they are at risk of falling.  Older adults should  determine their level of effort for physical activity relative to their level of fitness.  Older adults with chronic conditions should understand whether and how their conditions affect their ability to do regular physical activity safely.  Behavioral modification strategies: increasing lean protein intake, decreasing simple carbohydrates, increasing vegetables, increasing water intake, no skipping meals, meal planning and cooking strategies, keeping healthy foods in the home, ways to avoid boredom eating, and planning for success.  Kieu has agreed to follow-up with our clinic in 4 weeks. She was informed of the importance of frequent follow-up visits to maximize her success with intensive lifestyle modifications for her multiple health conditions.   Objective:   Blood pressure 139/85, pulse 67, temperature 98.2 F (36.8 C), height 5' 3 (1.6 m), weight 194 lb (88 kg), SpO2 98%. Body mass index is 34.37 kg/m.  General: Cooperative, alert, well developed, in no acute distress. HEENT: Conjunctivae and lids unremarkable. Cardiovascular: Regular rhythm.  Lungs: Normal work of breathing. Neurologic: No focal deficits.   Lab Results  Component Value Date   CREATININE 0.93 12/01/2023   BUN 12 12/01/2023   NA 140 12/01/2023   K 4.2 12/01/2023   CL 101 12/01/2023   CO2 23 12/01/2023   Lab Results  Component Value Date   ALT 12 12/01/2023   AST 21 12/01/2023   ALKPHOS 59 12/01/2023   BILITOT 0.5 12/01/2023   Lab Results  Component Value Date   HGBA1C 6.1 (H) 12/01/2023   HGBA1C 6.3 (H) 01/05/2022   HGBA1C 6.1 (H) 07/14/2021   HGBA1C 6.3 (H) 01/20/2021   HGBA1C 6.2 (H) 07/10/2020   Lab Results  Component Value Date   INSULIN  25.2 (H) 01/05/2022   INSULIN  18.4 07/14/2021   INSULIN  17.6 01/20/2021   INSULIN  12.0 07/10/2020   INSULIN  16.0 12/14/2019   Lab Results  Component Value Date   TSH 1.680 08/31/2017   Lab Results  Component Value Date   CHOL 169 12/01/2023   HDL 65  12/01/2023   LDLCALC 86 12/01/2023   TRIG 100 12/01/2023   CHOLHDL 2.6 12/01/2023   Lab Results  Component Value Date   VD25OH 45.1 12/01/2023   VD25OH 54.5 04/19/2023   VD25OH 59.2 01/05/2022   Lab Results  Component Value Date   WBC 4.4 09/12/2017   HGB 15.0 09/12/2017   HCT 43.7 09/12/2017   MCV 92.6 09/12/2017   PLT 173 09/12/2017   No results found for: IRON, TIBC, FERRITIN  Attestation Statements:   Reviewed by clinician on day of visit: allergies, medications, problem list, medical history, surgical history, family history, social history, and previous encounter notes.  I have reviewed the above documentation for accuracy and completeness, and I agree with the above. -  Hau Sanor d. Noemi Ishmael, NP-C

## 2024-01-13 ENCOUNTER — Telehealth (INDEPENDENT_AMBULATORY_CARE_PROVIDER_SITE_OTHER): Payer: Self-pay | Admitting: Adult Health

## 2024-01-13 ENCOUNTER — Other Ambulatory Visit (INDEPENDENT_AMBULATORY_CARE_PROVIDER_SITE_OTHER): Payer: Self-pay | Admitting: Family Medicine

## 2024-01-13 NOTE — Telephone Encounter (Signed)
 Pt called in stating that the pharmacy has not received her prescription for Ozempic  from her 01/11/24 visit. Please follow up with the patient.

## 2024-01-17 ENCOUNTER — Other Ambulatory Visit (INDEPENDENT_AMBULATORY_CARE_PROVIDER_SITE_OTHER): Payer: Self-pay | Admitting: *Deleted

## 2024-01-17 DIAGNOSIS — E1169 Type 2 diabetes mellitus with other specified complication: Secondary | ICD-10-CM

## 2024-01-17 MED ORDER — OZEMPIC (0.25 OR 0.5 MG/DOSE) 2 MG/3ML ~~LOC~~ SOPN
0.2500 mg | PEN_INJECTOR | SUBCUTANEOUS | 0 refills | Status: DC
Start: 2024-01-17 — End: 2024-02-08

## 2024-01-17 NOTE — Telephone Encounter (Signed)
 Called pharmacy and they did not receive the medication(Ozempic ), resent to pharmacy on file, patient updated thru MyChart.

## 2024-01-18 ENCOUNTER — Other Ambulatory Visit (INDEPENDENT_AMBULATORY_CARE_PROVIDER_SITE_OTHER): Payer: Self-pay | Admitting: Family Medicine

## 2024-01-18 ENCOUNTER — Telehealth (INDEPENDENT_AMBULATORY_CARE_PROVIDER_SITE_OTHER): Payer: Self-pay | Admitting: Family Medicine

## 2024-01-18 DIAGNOSIS — J302 Other seasonal allergic rhinitis: Secondary | ICD-10-CM

## 2024-01-18 NOTE — Telephone Encounter (Signed)
 Called pt to inform her the Rx refill was place yesterday and if she could if her Pharmacy a call to check on Rx refill. Pt verbalized understanding. Pt will give us  a call back if she has any issues.

## 2024-01-18 NOTE — Telephone Encounter (Signed)
 6/24 pt has not received Refill on Ozempic  2.5

## 2024-02-08 ENCOUNTER — Encounter (INDEPENDENT_AMBULATORY_CARE_PROVIDER_SITE_OTHER): Payer: Self-pay | Admitting: Family Medicine

## 2024-02-08 ENCOUNTER — Ambulatory Visit (INDEPENDENT_AMBULATORY_CARE_PROVIDER_SITE_OTHER): Admitting: Family Medicine

## 2024-02-08 VITALS — BP 124/75 | HR 90 | Temp 98.7°F | Ht 63.0 in | Wt 200.0 lb

## 2024-02-08 DIAGNOSIS — Z7984 Long term (current) use of oral hypoglycemic drugs: Secondary | ICD-10-CM

## 2024-02-08 DIAGNOSIS — E1169 Type 2 diabetes mellitus with other specified complication: Secondary | ICD-10-CM

## 2024-02-08 DIAGNOSIS — Z6835 Body mass index (BMI) 35.0-35.9, adult: Secondary | ICD-10-CM | POA: Diagnosis not present

## 2024-02-08 DIAGNOSIS — I152 Hypertension secondary to endocrine disorders: Secondary | ICD-10-CM

## 2024-02-08 DIAGNOSIS — Z7985 Long-term (current) use of injectable non-insulin antidiabetic drugs: Secondary | ICD-10-CM

## 2024-02-08 MED ORDER — OZEMPIC (0.25 OR 0.5 MG/DOSE) 2 MG/3ML ~~LOC~~ SOPN: 0.5000 mg | PEN_INJECTOR | SUBCUTANEOUS | 0 refills | Status: DC

## 2024-02-08 NOTE — Progress Notes (Signed)
 Molly [name], D.O.  ABFM, ABOM Specializing in Clinical Bariatric Medicine  Office located at: [address] [place], [place]  [address]   Assessment and Plan:   Medications Discontinued During This Encounter  Medication Reason   Semaglutide ,0.25 or 0.5MG /DOS, (OZEMPIC , 0.25 OR 0.5 MG/DOSE,) 2 MG/3ML SOPN Reorder     Meds ordered this encounter  Medications   Semaglutide ,0.25 or 0.5MG /DOS, (OZEMPIC , 0.25 OR 0.5 MG/DOSE,) 2 MG/3ML SOPN    Sig: Inject 0.5 mg into the skin once a week.    Dispense:  3 mL    Refill:  0     FOR THE DISEASE OF OBESITY:  BMI 35.0-35.9,adult-CURRENT BMI 35.43 Morbid obesity (HCC)-start bmi 39.50/date 08/31/2017 Assessment & Plan: Since last office visit on 01/11/2024 patient's muscle mass has increased by 1.8 lbs. Fat mass has increased by 4.2 lbs. Total body water has increased by 1.2 lbs.  Counseling done on how various foods will affect these numbers and how to maximize success  Total lbs lost to date: - 23 lbs  Total weight loss percentage to date: -10.31%    Recommended Dietary Goals Molly [name] is currently in the action stage of change. As such, her goal is to continue weight management plan.  We slightly modified her meal plan, she will be following the CAT 2 MP with only 100 snack calories in hopes of achieving more weight loss.    Behavioral Intervention We discussed the following today: increasing lean protein intake to established goals, increasing vegetables, and continue to practice mindfulness when eating  Additional resources provided today: Handout on CAT 2 meal plan, Handout on Mindful-Eating Tips.   Evidence-based interventions for health behavior change were utilized today including the discussion of self monitoring techniques, problem-solving barriers and SMART goal setting techniques.   Regarding patient's less desirable eating habits and patterns, we employed the technique of small changes.   Pt will specifically work  on: n/a   Recommended Physical Activity Goals Molly [name] has been advised to work up to 300-450 minutes of moderate intensity aerobic activity a week and strengthening exercises 2-3 times per week for cardiovascular health, weight loss maintenance and preservation of muscle mass.   She has agreed to INCREASE current exercise regimen to 5 days a week.    Pharmacotherapy Continue same regimen.    ASSOCIATED CONDITIONS ADDRESSED TODAY:   Type 2 diabetes mellitus with obesity [facility]) Assessment & Plan: Lab Results  Component Value Date   HGBA1C 6.1 (H) 12/01/2023   HGBA1C 6.3 (H) 01/05/2022   HGBA1C 6.1 (H) 07/14/2021   INSULIN  25.2 (H) 01/05/2022   INSULIN  18.4 07/14/2021   INSULIN  17.6 01/20/2021    HgbA1c is controlled for age and comorbid conditions. Denies symptoms of hypoglycemia or hyperglycemia. On Ozempic  0.25 mg weekly and Metformin  500 mg twice daily with good adherence. Her constipation is controlled on Miralax . She endorses having cravings at night while watching TV which leads to snacking at times.  - Educated patient on mindful-eating; handout provided.  - Maintain with Metformin  at current dose.  - INCREASE Ozempic  to 0.5 mg weekly; extensive discussion had regarding side effects and protecting against constipation.  - Continue Miralax  once or twice daily. Encouraged adequate hydration and daily movement.  - Continue with reduced calorie meal plan low on processed crabs and simple sugars.    Follow up:   Return 03/07/2024 at 8:40 AM.  She was informed of the importance of frequent follow up visits to maximize her success with intensive  lifestyle modifications for her multiple health conditions.   Subjective:   Chief complaint: Obesity Molly [name] is here to discuss her progress with her obesity treatment plan. She is on the Category 2 Plan and states she is following her eating plan approximately 80% of the time. She states she is going to the gym or walking 45 minutes 3 days  per week.  Interval History:  Molly [name] is here for a follow up office visit. Since last OV on 01/11/2024 , she is up 6 lbs. Returned from a pleasant 2 week vacation in [place] . While there, she endorses engaging in off-plan eating.  She is somewhat back on track food wise. She endorses having cravings at night while watching TV which leads to snacking at times.   Pharmacotherapy that aid with weight loss: She is currently taking Ozempic  0.25 mg wkly and Metformin  500 mg twice daily.    Review of Systems:  Pertinent positives were addressed with patient today.  Reviewed by clinician on day of visit: allergies, medications, problem list, medical history, surgical history, family history, social history, and previous encounter notes.  Weight Summary and Biometrics   Weight Lost Since Last Visit: 0lb  Weight Gained Since Last Visit: 6lb   Vitals Temp: 98.7 F (37.1 C) BP: 124/75 Pulse Rate: 90 SpO2: 99 %   Anthropometric Measurements Height: 5' 3 (1.6 m) Weight: 200 lb (90.7 kg) BMI (Calculated): 35.44 Weight at Last Visit: 194lb Weight Lost Since Last Visit: 0lb Weight Gained Since Last Visit: 6lb Starting Weight: 223lb Total Weight Loss (lbs): 23 lb (10.4 kg) Peak Weight: 230lb   Body Composition  Body Fat %: 44.7 % Fat Mass (lbs): 89.6 lbs Muscle Mass (lbs): 105.2 lbs Total Body Water (lbs): 77.2 lbs Visceral Fat Rating : 15   Other Clinical Data Fasting: No Labs: no Today's Visit #: 84 Starting Date: 08/31/17    Objective:   PHYSICAL EXAM: Blood pressure 124/75, pulse 90, temperature 98.7 F (37.1 C), height 5' 3 (1.6 m), weight 200 lb (90.7 kg), SpO2 99%. Body mass index is 35.43 kg/m.  General: she is overweight, cooperative and in no acute distress. PSYCH: Has normal mood, affect and thought process.   HEENT: EOMI, sclerae are anicteric. Lungs: Normal breathing effort, no conversational dyspnea. Extremities: Moves * 4 Neurologic: A  and O * 3, good insight  DIAGNOSTIC DATA REVIEWED: BMET    Component Value Date/Time   NA 140 12/01/2023 1229   K 4.2 12/01/2023 1229   CL 101 12/01/2023 1229   CO2 23 12/01/2023 1229   GLUCOSE 88 12/01/2023 1229   GLUCOSE 134 (H) 09/12/2017 2118   BUN 12 12/01/2023 1229   CREATININE 0.93 12/01/2023 1229   CREATININE 0.90 03/23/2016 1116   CALCIUM  10.6 (H) 12/01/2023 1229   GFRNONAA 55 (L) 07/10/2020 0940   GFRAA 63 07/10/2020 0940   Lab Results  Component Value Date   HGBA1C 6.1 (H) 12/01/2023   HGBA1C (H) 08/31/2010    6.0 (NOTE)                                                                       According to the ADA Clinical Practice Recommendations for 2011, when HbA1c is used as a screening  test:   >=6.5%   Diagnostic of Diabetes Mellitus           (if abnormal result  is confirmed)  5.7-6.4%   Increased risk of developing Diabetes Mellitus  References:Diagnosis and Classification of Diabetes Mellitus,Diabetes Care,2011,34(Suppl 1):S62-S69 and Standards of Medical Care in         Diabetes - 2011,Diabetes Care,2011,34  (Suppl 1):S11-S61.   Lab Results  Component Value Date   INSULIN  25.2 (H) 01/05/2022   INSULIN  20.4 08/31/2017   Lab Results  Component Value Date   TSH 1.680 08/31/2017   CBC    Component Value Date/Time   WBC 4.4 09/12/2017 2118   RBC 4.72 09/12/2017 2118   HGB 15.0 09/12/2017 2118   HGB 14.0 08/31/2017 1140   HCT 43.7 09/12/2017 2118   HCT 41.2 08/31/2017 1140   PLT 173 09/12/2017 2118   MCV 92.6 09/12/2017 2118   MCV 92 08/31/2017 1140   MCH 31.8 09/12/2017 2118   MCHC 34.3 09/12/2017 2118   RDW 13.1 09/12/2017 2118   RDW 13.7 08/31/2017 1140   Iron Studies No results found for: IRON, TIBC, FERRITIN, IRONPCTSAT Lipid Panel     Component Value Date/Time   CHOL 169 12/01/2023 1229   TRIG 100 12/01/2023 1229   HDL 65 12/01/2023 1229   CHOLHDL 2.6 12/01/2023 1229   CHOLHDL 2.6 08/31/2010 0540   VLDL 12 08/31/2010 0540    LDLCALC 86 12/01/2023 1229   Hepatic Function Panel     Component Value Date/Time   PROT 7.3 12/01/2023 1229   ALBUMIN 4.7 12/01/2023 1229   AST 21 12/01/2023 1229   ALT 12 12/01/2023 1229   ALKPHOS 59 12/01/2023 1229   BILITOT 0.5 12/01/2023 1229      Component Value Date/Time   TSH 1.680 08/31/2017 1140   Nutritional Lab Results  Component Value Date   VD25OH 45.1 12/01/2023   VD25OH 54.5 04/19/2023   VD25OH 59.2 01/05/2022    Attestations:   I, Special Puri, acting as a Stage manager for [employer], DO., have compiled all relevant documentation for today's office visit on behalf of Molly Jenkins, DO, while in the presence of [employer], DO.  I have reviewed the above documentation for accuracy and completeness, and I agree with the above. Molly [name], D.O.  The 21st Century Cures Act was signed into law in 2016 which includes the topic of electronic health records.  This provides immediate access to information in MyChart.  This includes consultation notes, operative notes, office notes, lab results and pathology reports.  If you have any questions about what you read please let us  know at your next visit so we can discuss your concerns and take corrective action if need be.  We are right here with you.

## 2024-03-07 ENCOUNTER — Encounter (INDEPENDENT_AMBULATORY_CARE_PROVIDER_SITE_OTHER): Payer: Self-pay | Admitting: Family Medicine

## 2024-03-07 ENCOUNTER — Ambulatory Visit (INDEPENDENT_AMBULATORY_CARE_PROVIDER_SITE_OTHER): Admitting: Family Medicine

## 2024-03-07 VITALS — BP 128/78 | HR 81 | Temp 98.5°F | Ht 63.0 in | Wt 197.0 lb

## 2024-03-07 DIAGNOSIS — Z7985 Long-term (current) use of injectable non-insulin antidiabetic drugs: Secondary | ICD-10-CM

## 2024-03-07 DIAGNOSIS — Z0000 Encounter for general adult medical examination without abnormal findings: Secondary | ICD-10-CM

## 2024-03-07 DIAGNOSIS — I152 Hypertension secondary to endocrine disorders: Secondary | ICD-10-CM

## 2024-03-07 DIAGNOSIS — E1159 Type 2 diabetes mellitus with other circulatory complications: Secondary | ICD-10-CM

## 2024-03-07 DIAGNOSIS — Z6834 Body mass index (BMI) 34.0-34.9, adult: Secondary | ICD-10-CM

## 2024-03-07 DIAGNOSIS — E785 Hyperlipidemia, unspecified: Secondary | ICD-10-CM

## 2024-03-07 DIAGNOSIS — E1169 Type 2 diabetes mellitus with other specified complication: Secondary | ICD-10-CM

## 2024-03-07 DIAGNOSIS — Z7984 Long term (current) use of oral hypoglycemic drugs: Secondary | ICD-10-CM

## 2024-03-07 DIAGNOSIS — E559 Vitamin D deficiency, unspecified: Secondary | ICD-10-CM

## 2024-03-07 MED ORDER — OZEMPIC (0.25 OR 0.5 MG/DOSE) 2 MG/3ML ~~LOC~~ SOPN: 0.5000 mg | PEN_INJECTOR | SUBCUTANEOUS | 0 refills | Status: DC

## 2024-03-07 NOTE — Progress Notes (Signed)
 Molly [name], D.O.  ABFM, ABOM Specializing in Clinical Bariatric Medicine  Office located at: [address] [place], [place]  [address]   Assessment and Plan:   Medications Discontinued During This Encounter  Medication Reason   Semaglutide,0.25 or 0.5MG /DOS, (OZEMPIC, 0.25 OR 0.5 MG/DOSE,) 2 MG/3ML SOPN Reorder     Meds ordered this encounter  Medications   Semaglutide,0.25 or 0.5MG /DOS, (OZEMPIC, 0.25 OR 0.5 MG/DOSE,) 2 MG/3ML SOPN    Sig: Inject 0.5 mg into the skin once a week.    Dispense:  3 mL    Refill:  0     FOR THE DISEASE OF OBESITY:  BMI 34.0-34.9,adult - current BMI 34.91 Morbid obesity (HCC)-start bmi 39.50/date 08/31/2017 Assessment & Plan: Since last office visit on 02/08/2024 patient's muscle mass has increased by 9.4 lbs. Fat mass has decreased by 13.2 lbs. Total body water has decreased by 0.6 lbs.  Body fat % has decreased by 6 %. Counseling done on how various foods will affect these numbers and how to maximize success  Total lbs lost to date: -26 lbs Total Molly loss percentage to date: -11.66 %   Recommended Dietary Goals Molly [name] is currently in the action stage of change. As such, her goal is to continue Molly management plan.  She has agreed to: continue current plan   Behavioral Intervention We discussed the following today: visceral obesity, increasing lean protein intake to established goals and increasing water intake   Additional resources provided today: None  Evidence-based interventions for [name] behavior change were utilized today including the discussion of self monitoring techniques, problem-solving barriers and SMART goal setting techniques.   Regarding patient's less desirable eating habits and patterns, we employed the technique of small changes.   Goal: n/a   Recommended Physical Activity Goals Molly [name] has been advised to work up to 300-450 minutes of moderate intensity aerobic activity a week and strengthening  exercises 2-3 times per week for cardiovascular [name], Molly loss maintenance and preservation of muscle mass.   She has agreed to increase her exercise regimen to 5 days/week.   Pharmacotherapy See T2DM note.   ASSOCIATED CONDITIONS ADDRESSED TODAY:  Pt requested I review labs with PCP dated 02/03/2024. Labs were reviewed with pt today and extensive education provided on them. All questions were answered appropriately.    Type 2 diabetes mellitus with obesity (HCC) Assessment & Plan: Most recent Hemoglobin A1c was very well controlled at 5.7. She notes feeling dizzy on one occasion; her blood sugar was 87 at that time. She has not had any repeat incidents since then. She is on Metformin 500 mg twice daily and Ozempic 0.5 mg weekly with good adherence. She has taken two shots of the 0.5 mg dose so far. She is having daily bowel movements with the help of Miralax 1-2 times daily. Her acid reflux has been controlled on Protonix.  - Continue all anti-diabetic medications at current doses.  - Preventative strategies for acid reflux were discussed today; maintain w/ Protonix.  - Continue working on nutrition plan to decrease simple carbohydrates, increase lean proteins and exercise to promote Molly loss.    Hypertension associated with type 2 diabetes mellitus (HCC) Assessment & Plan: Last 3 blood pressure readings in our office are as follows: BP Readings from Last 3 Encounters:  03/07/24 128/78  02/08/24 124/75  01/11/24 139/85   The 10-year ASCVD risk score (Arnett DK, et al., 2019) is: 35.1%  Lab Results  Component Value Date  CREATININE 0.93 12/01/2023   On Norvasc  10 mg daily, Cozaar  100 mg daily, and Dyazide 37.5/25 mg with reported good compliance and tolerance. Blood pressure is controlled today. Pt asx. Reviewed recent renal parameters on 02/03/2024 which were within acceptable ranges.  - Maintain with current regimen. - Continue with low sodium diet - advance  exercise    Hyperlipidemia associated with type 2 diabetes mellitus (HCC) Assessment & Plan: On Crestor  10 mg daily with reported good compliance and tolerance. She had a lipid panel on 02/03/2024. TRIG were elevated at 184. LDL was 91; optimal level <29 for a diabetic. HDL was excellent at 59. Liver enzymes dated were WNL.  - Continue with our prudent nutritional plan that is low in saturated and trans fats, and low in fatty carbs to improve these numbers.  - Advance exercise as able - Recommend she discuss with PCP about potentially increasing Crestor  dose.    Vitamin D  deficiency, unspecified Assessment & Plan: Her Vit D levels were 45 on 02/03/2024 which is close to the optimal level of 50 to 70. Pt will continue her daily MVM which contains some Vit D. Will continue to monitor levels expectantly.    Healthcare maintenance Assessment & Plan: She had a mammogram on 02/29/2024 which showed no suspicious lesions and no mammographic evidence of malignancy. Continue annual screening per OB/gyn.    Follow up:   Return 04/18/2024 12:20 PM.  She was informed of the importance of frequent follow up visits to maximize her success with intensive lifestyle modifications for her multiple [name] conditions.   Subjective:   Chief complaint: Obesity Molly [name] is here to discuss her progress with her obesity treatment plan. She is on CAT 2 MP with only 100 snack calories. She states she is doing yoga or walking 45 minutes 3-4 days per week.   Interval History:  Molly [name] is here for a follow up office visit. Since last OV on 02/08/2024 , she is down 3 lbs.States she is following her eating plan approximately 85% of the time.  She endorses not getting in the full recommended amounts of lean proteins but is trying to do better.    Pharmacotherapy that aid with Molly loss: She is currently taking Metformin  500 mg twice daily and Ozempic  0.5 mg weekly.   Review of Systems:  Pertinent positives  were addressed with patient today.  Reviewed by clinician on day of visit: allergies, medications, problem list, medical history, surgical history, family history, social history, and previous encounter notes.  Molly Summary and Biometrics   Molly Lost Since Last Visit: 3lb  Molly Gained Since Last Visit: 0   Vitals Temp: 98.5 F (36.9 C) BP: 128/78 Pulse Rate: 81 SpO2: 96 %   Anthropometric Measurements Height: 5' 3 (1.6 m) Molly: 197 lb (89.4 kg) BMI (Calculated): 34.91 Molly at Last Visit: 200lb Molly Lost Since Last Visit: 3lb Molly Gained Since Last Visit: 0 Starting Molly: 223lb Total Molly Loss (lbs): 26 lb (11.8 kg) Peak Molly: 230lb   Body Composition  Body Fat %: 38.7 % Fat Mass (lbs): 76.4 lbs Muscle Mass (lbs): 114.6 lbs Total Body Water (lbs): 76.6 lbs Visceral Fat Rating : 13   Other Clinical Data Fasting: Yes Labs: no Today's Visit #: 85 Starting Date: 08/31/17    Objective:   PHYSICAL EXAM: Blood pressure 128/78, pulse 81, temperature 98.5 F (36.9 C), height 5' 3 (1.6 m), Molly 197 lb (89.4 kg), SpO2 96%. Body mass index is 34.9 kg/m.  General: she  is overweight, cooperative and in no acute distress. PSYCH: Has normal mood, affect and thought process.   HEENT: EOMI, sclerae are anicteric. Lungs: Normal breathing effort, no conversational dyspnea. Extremities: Moves * 4 Neurologic: A and O * 3, good insight  DIAGNOSTIC DATA REVIEWED: BMET    Component Value Date/Time   NA 140 12/01/2023 1229   K 4.2 12/01/2023 1229   CL 101 12/01/2023 1229   CO2 23 12/01/2023 1229   GLUCOSE 88 12/01/2023 1229   GLUCOSE 134 (H) 09/12/2017 2118   BUN 12 12/01/2023 1229   CREATININE 0.93 12/01/2023 1229   CREATININE 0.90 03/23/2016 1116   CALCIUM  10.6 (H) 12/01/2023 1229   GFRNONAA 55 (L) 07/10/2020 0940   GFRAA 63 07/10/2020 0940   Lab Results  Component Value Date   HGBA1C 6.1 (H) 12/01/2023   HGBA1C (H) 08/31/2010     6.0 (NOTE)                                                                       According to the ADA Clinical Practice Recommendations for 2011, when HbA1c is used as a screening test:   >=6.5%   Diagnostic of Diabetes Mellitus           (if abnormal result  is confirmed)  5.7-6.4%   Increased risk of developing Diabetes Mellitus  References:Diagnosis and Classification of Diabetes Mellitus,Diabetes Care,2011,34(Suppl 1):S62-S69 and Standards of Medical Care in         Diabetes - 2011,Diabetes Care,2011,34  (Suppl 1):S11-S61.   Lab Results  Component Value Date   INSULIN  25.2 (H) 01/05/2022   INSULIN  20.4 08/31/2017   Lab Results  Component Value Date   TSH 1.680 08/31/2017   CBC    Component Value Date/Time   WBC 4.4 09/12/2017 2118   RBC 4.72 09/12/2017 2118   HGB 15.0 09/12/2017 2118   HGB 14.0 08/31/2017 1140   HCT 43.7 09/12/2017 2118   HCT 41.2 08/31/2017 1140   PLT 173 09/12/2017 2118   MCV 92.6 09/12/2017 2118   MCV 92 08/31/2017 1140   MCH 31.8 09/12/2017 2118   MCHC 34.3 09/12/2017 2118   RDW 13.1 09/12/2017 2118   RDW 13.7 08/31/2017 1140   Iron Studies No results found for: IRON, TIBC, FERRITIN, IRONPCTSAT Lipid Panel     Component Value Date/Time   CHOL 169 12/01/2023 1229   TRIG 100 12/01/2023 1229   HDL 65 12/01/2023 1229   CHOLHDL 2.6 12/01/2023 1229   CHOLHDL 2.6 08/31/2010 0540   VLDL 12 08/31/2010 0540   LDLCALC 86 12/01/2023 1229   Hepatic Function Panel     Component Value Date/Time   PROT 7.3 12/01/2023 1229   ALBUMIN 4.7 12/01/2023 1229   AST 21 12/01/2023 1229   ALT 12 12/01/2023 1229   ALKPHOS 59 12/01/2023 1229   BILITOT 0.5 12/01/2023 1229      Component Value Date/Time   TSH 1.680 08/31/2017 1140   Nutritional Lab Results  Component Value Date   VD25OH 45.1 12/01/2023   VD25OH 54.5 04/19/2023   VD25OH 59.2 01/05/2022    Attestations:   I, Special Puri, acting as a Stage manager for [employer], DO., have  compiled all relevant documentation for today's office  visit on behalf of Molly Jenkins, DO, while in the presence of Molly Jenkins, DO.  I have reviewed the above documentation for accuracy and completeness, and I agree with the above. Molly [name], D.O.  The 21st Century Cures Act was signed into law in 2016 which includes the topic of electronic [name] records.  This provides immediate access to information in MyChart.  This includes consultation notes, operative notes, office notes, lab results and pathology reports.  If you have any questions about what you read please let us  know at your next visit so we can discuss your concerns and take corrective action if need be.  We are right here with you.

## 2024-04-06 ENCOUNTER — Other Ambulatory Visit (INDEPENDENT_AMBULATORY_CARE_PROVIDER_SITE_OTHER): Payer: Self-pay | Admitting: Family Medicine

## 2024-04-06 DIAGNOSIS — E1169 Type 2 diabetes mellitus with other specified complication: Secondary | ICD-10-CM

## 2024-04-18 ENCOUNTER — Ambulatory Visit (INDEPENDENT_AMBULATORY_CARE_PROVIDER_SITE_OTHER): Admitting: Family Medicine

## 2024-04-18 ENCOUNTER — Encounter (INDEPENDENT_AMBULATORY_CARE_PROVIDER_SITE_OTHER): Payer: Self-pay | Admitting: Family Medicine

## 2024-04-18 DIAGNOSIS — J302 Other seasonal allergic rhinitis: Secondary | ICD-10-CM | POA: Diagnosis not present

## 2024-04-18 DIAGNOSIS — Z6834 Body mass index (BMI) 34.0-34.9, adult: Secondary | ICD-10-CM

## 2024-04-18 DIAGNOSIS — E785 Hyperlipidemia, unspecified: Secondary | ICD-10-CM

## 2024-04-18 DIAGNOSIS — E1169 Type 2 diabetes mellitus with other specified complication: Secondary | ICD-10-CM | POA: Diagnosis not present

## 2024-04-18 DIAGNOSIS — Z7985 Long-term (current) use of injectable non-insulin antidiabetic drugs: Secondary | ICD-10-CM | POA: Diagnosis not present

## 2024-04-18 DIAGNOSIS — I152 Hypertension secondary to endocrine disorders: Secondary | ICD-10-CM | POA: Diagnosis not present

## 2024-04-18 DIAGNOSIS — E1159 Type 2 diabetes mellitus with other circulatory complications: Secondary | ICD-10-CM

## 2024-04-18 DIAGNOSIS — Z7984 Long term (current) use of oral hypoglycemic drugs: Secondary | ICD-10-CM

## 2024-04-18 DIAGNOSIS — E66813 Obesity, class 3: Secondary | ICD-10-CM | POA: Diagnosis not present

## 2024-04-18 MED ORDER — OZEMPIC (0.25 OR 0.5 MG/DOSE) 2 MG/3ML ~~LOC~~ SOPN: 0.5000 mg | PEN_INJECTOR | SUBCUTANEOUS | 1 refills | Status: DC

## 2024-04-18 MED ORDER — LEVOCETIRIZINE DIHYDROCHLORIDE 5 MG PO TABS: 5.0000 mg | ORAL_TABLET | Freq: Every evening | ORAL | 0 refills | Status: DC

## 2024-04-18 NOTE — Progress Notes (Signed)
 Molly [name], D.O.  ABFM, ABOM Specializing in Clinical Bariatric Medicine  Office located at: [address] [place], [place]  [address]   Assessment and Plan:  No orders of the defined types were placed in this encounter.   There are no discontinued medications.   No orders of the defined types were placed in this encounter.     FOR THE DISEASE OF OBESITY:  Morbid obesity (HCC) start 39.50 Class 3 severe obesity with serious comorbidity and body mass index (BMI) of 40.0 to 44.9 in adult, current 34.37 Assessment & Plan: Since last office visit on 03/07/24 patient's muscle mass has decreased by 7.8 lbs. Fat mass has {DID:29233} by ***lbs. Total body water has {DID:29233} by ***lbs.  Body fat % has {DID:29233} by *** %. Counseling done on how various foods will affect these numbers and how to maximize success  Total lbs lost to date: *** lbs Total weight loss percentage to date: *** %   Recommended Dietary Goals Molly [name] is currently in the action stage of change. As such, her goal is to continue weight management plan.  She has agreed to: CAT 2 MP with only 100 snack calories.    Behavioral Intervention We discussed the following today: increasing lean protein intake to established goals, work on managing stress, creating time for self-care and relaxation, and continue to work on implementation of reduced calorie nutritional plan  Additional resources provided today: {DOhandouts:31655::None}  Evidence-based interventions for health behavior change were utilized today including the discussion of self monitoring techniques, problem-solving barriers and SMART goal setting techniques.   Regarding patient's less desirable eating habits and patterns, we employed the technique of small changes.   Goal(s) for next OV: ***    Recommended Physical Activity Goals Molly [name] has been advised to work up to 300-450 minutes of moderate intensity aerobic activity a week and  strengthening exercises 2-3 times per week for cardiovascular health, weight loss maintenance and preservation of muscle mass.   She has agreed to: Think about enjoyable ways to increase daily physical activity and overcoming barriers to exercise, Increase physical activity in their day and reduce sedentary time (increase NEAT)., and Increase volume of physical activity to a goal of 240 minutes a week   Pharmacotherapy We both agreed to: Continue with current nutritional and behavioral strategies and weight loss medication Metformin  and Ozempic     ASSOCIATED CONDITIONS ADDRESSED TODAY:   Type 2 diabetes mellitus with obesity Assessment & Plan Lab Results  Component Value Date   HGBA1C 6.1 (H) 12/01/2023   HGBA1C 6.3 (H) 01/05/2022   HGBA1C 6.1 (H) 07/14/2021   INSULIN  25.2 (H) 01/05/2022   INSULIN  18.4 07/14/2021   INSULIN  17.6 01/20/2021    Last A1C from PCP shows an A1c of 5.7 Under great control.  Metformin  500 mg tiee daily and ozempic  0.5 mg wekly  Good control of hunger and cravings  Taking both takes ozempic  on Tuesdays (refill O) Doesn't feel too much of a difference  Has cramps in stimach and maybe a little headache  Taking protonix when reflux is bad  Eating wrong food like fried food it causes loose stool  No constipation foes miralax   Quiets foods noise and does not want them as often Overall helping sweet cravings   7/15 startes on 0.5 dose of ozempic   197-194    Seasonal allergies Assessment & Plan Assessment: Condition is {EWCOURSE:24262}.  Symptoms are well controlled with {allergymeds:29240}. Patient reports compliance with medications and good tolerance.  Plan: Continue current regimen. Will refill *** today. - refill zyxal for allergies   Hypertension associated with type 2 diabetes mellitus [facility]) Assessment & Plan BP Readings from Last 3 Encounters:  04/18/24 132/77  03/07/24 128/78  02/08/24 124/75   The 10-year ASCVD risk score (Arnett DK,  et al., 2019) is: 36.2%  Lab Results  Component Value Date   CREATININE 0.93 12/01/2023    BP todau at goal today controlled. On Cozaar  100 mg daily and Dyazide  Not taking Norvasc  10 mg regularly because pt states that it causes her bp to go too far down. ( 2 -3 weeks)   spells felt dizzy and when check bp 100/50s  When dizzinees its usually under 100 systolic  150/90 is goal  Recommended to contact PCP  hOld off amlodopin until speaking with PCP  Chek bp every couple days and especially if feeling dizzy or light headed     Hyperlipidemia associated with type 2 diabetes mellitus [facility]) Assessment & Plan Lab Results  Component Value Date   CHOL 169 12/01/2023   HDL 65 12/01/2023   LDLCALC 86 12/01/2023   TRIG 100 12/01/2023   CHOLHDL 2.6 12/01/2023   Crestor  10 mg  with good compliance and tolerance. PCP obtained labs on 02/03/24. TRIG were elevated at 184. LDL was 91; optimal level <29 for a diabetic.       Follow up:   No follow-ups on file. *** She was informed of the importance of frequent follow up visits to maximize her success with intensive lifestyle modifications for her multiple health conditions.    Subjective:    Chief complaint: Obesity [name] is here to discuss her progress with her obesity treatment plan. She is on CAT 2 MP with only 100 snack calories. and states she is following her eating plan approximately 75% of the time. [facility]    Interval History:  Molly [name] is here for a follow up office visit. Pt has experienced a weight *** of *** since last OV on ***.    Their dietary and life style habits include:  - Tracking Calories/Macros: no - ***  - Eating More Whole Foods: yes  - Adequate Protein Intake: no - ***  - Adequate Water Intake: yes  - Skipping Meals: no - ***  - Sleeping 7-9 Hours/ Night: yes  - bad reaction to to pnewmonia shot  - had no energy  - gave benadryl and cream  Itches and  burns at arm right arm swelling redness and itching.. uncomfortable distracted from seld help activities   Pharmacotherapy that aid with weight loss: She is currently taking Metformin  500 mg twice daily and Ozempic  0.5 mg weekly.    Review of Systems:  Pertinent positives were addressed with patient today.  Reviewed by clinician on day of visit: allergies, medications, problem list, medical history, surgical history, family history, social history, and previous encounter notes.  Weight Summary and Biometrics   Weight Lost Since Last Visit: 3lb  Weight Gained Since Last Visit: 0lb  ***  Vitals Temp: 97.9 F (36.6 C) BP: 132/77 Pulse Rate: 62 SpO2: 100 %   Anthropometric Measurements Height: 5' 3 (1.6 m) Weight: 194 lb (88 kg) BMI (Calculated): 34.37 Weight at Last Visit: 197lb Weight Lost Since Last Visit: 3lb Weight Gained Since Last Visit: 0lb Starting Weight: 223lb Total Weight Loss (lbs): 29 lb (13.2 kg) Peak Weight: 230lb   Body Composition  Body  Fat %: 42.1 % Fat Mass (lbs): 81.8 lbs Muscle Mass (lbs): 106.8 lbs Total Body Water (lbs): 73.8 lbs Visceral Fat Rating : 14   Other Clinical Data Fasting: no Labs: no Today's Visit #: 86 Starting Date: 08/31/17    Objective:   PHYSICAL EXAM: Blood pressure 132/77, pulse 62, temperature 97.9 F (36.6 C), height 5' 3 (1.6 m), weight 194 lb (88 kg), SpO2 100%. Body mass index is 34.37 kg/m.  General: she is overweight, cooperative and in no acute distress. PSYCH: Has normal mood, affect and thought process.   HEENT: EOMI, sclerae are anicteric. Lungs: Normal breathing effort, no conversational dyspnea. Extremities: Moves * 4 Neurologic: A and O * 3, good insight  DIAGNOSTIC DATA REVIEWED: BMET    Component Value Date/Time   NA 140 12/01/2023 1229   K 4.2 12/01/2023 1229   CL 101 12/01/2023 1229   CO2 23 12/01/2023 1229   GLUCOSE 88 12/01/2023 1229   GLUCOSE 134 (H) 09/12/2017 2118   BUN 12  12/01/2023 1229   CREATININE 0.93 12/01/2023 1229   CREATININE 0.90 03/23/2016 1116   CALCIUM  10.6 (H) 12/01/2023 1229   GFRNONAA 55 (L) 07/10/2020 0940   GFRAA 63 07/10/2020 0940   Lab Results  Component Value Date   HGBA1C 6.1 (H) 12/01/2023   HGBA1C (H) 08/31/2010    6.0 (NOTE)                                                                       According to the ADA Clinical Practice Recommendations for 2011, when HbA1c is used as a screening test:   >=6.5%   Diagnostic of Diabetes Mellitus           (if abnormal result  is confirmed)  5.7-6.4%   Increased risk of developing Diabetes Mellitus  References:Diagnosis and Classification of Diabetes Mellitus,Diabetes Care,2011,34(Suppl 1):S62-S69 and Standards of Medical Care in         Diabetes - 2011,Diabetes Care,2011,34  (Suppl 1):S11-S61.   Lab Results  Component Value Date   INSULIN  25.2 (H) 01/05/2022   INSULIN  20.4 08/31/2017   Lab Results  Component Value Date   TSH 1.680 08/31/2017   CBC    Component Value Date/Time   WBC 4.4 09/12/2017 2118   RBC 4.72 09/12/2017 2118   HGB 15.0 09/12/2017 2118   HGB 14.0 08/31/2017 1140   HCT 43.7 09/12/2017 2118   HCT 41.2 08/31/2017 1140   PLT 173 09/12/2017 2118   MCV 92.6 09/12/2017 2118   MCV 92 08/31/2017 1140   MCH 31.8 09/12/2017 2118   MCHC 34.3 09/12/2017 2118   RDW 13.1 09/12/2017 2118   RDW 13.7 08/31/2017 1140   Iron Studies No results found for: IRON, TIBC, FERRITIN, IRONPCTSAT Lipid Panel     Component Value Date/Time   CHOL 169 12/01/2023 1229   TRIG 100 12/01/2023 1229   HDL 65 12/01/2023 1229   CHOLHDL 2.6 12/01/2023 1229   CHOLHDL 2.6 08/31/2010 0540   VLDL 12 08/31/2010 0540   LDLCALC 86 12/01/2023 1229   Hepatic Function Panel     Component Value Date/Time   PROT 7.3 12/01/2023 1229   ALBUMIN 4.7 12/01/2023 1229   AST 21 12/01/2023 1229   ALT  12 12/01/2023 1229   ALKPHOS 59 12/01/2023 1229   BILITOT 0.5 12/01/2023 1229       Component Value Date/Time   TSH 1.680 08/31/2017 1140   Nutritional Lab Results  Component Value Date   VD25OH 45.1 12/01/2023   VD25OH 54.5 04/19/2023   VD25OH 59.2 01/05/2022    Attestations:   I, ***, acting as a Stage manager for Molly Jenkins, DO., have compiled all relevant documentation for today's office visit on behalf of Molly Jenkins, DO, while in the presence of [employer], DO.  I have spent *** minutes in the care of the patient today including *** minutes face-to-face assessing and reviewing listed medical problems above as outlined in office visit note and providing nutritional and behavioral counseling as outlined in obesity care plan.   I have reviewed the above documentation for accuracy and completeness, and I agree with the above. Molly JINNY [name], D.O.  The 21st Century Cures Act was signed into law in 2016 which includes the topic of electronic health records.  This provides immediate access to information in MyChart.  This includes consultation notes, operative notes, office notes, lab results and pathology reports.  If you have any questions about what you read please let us  know at your next visit so we can discuss your concerns and take corrective action if need be.  We are right here with you.

## 2024-04-26 DIAGNOSIS — H524 Presbyopia: Secondary | ICD-10-CM | POA: Diagnosis not present

## 2024-05-18 DIAGNOSIS — H527 Unspecified disorder of refraction: Secondary | ICD-10-CM | POA: Diagnosis not present

## 2024-05-18 DIAGNOSIS — H401122 Primary open-angle glaucoma, left eye, moderate stage: Secondary | ICD-10-CM | POA: Diagnosis not present

## 2024-05-18 DIAGNOSIS — H26491 Other secondary cataract, right eye: Secondary | ICD-10-CM | POA: Diagnosis not present

## 2024-05-18 DIAGNOSIS — H401111 Primary open-angle glaucoma, right eye, mild stage: Secondary | ICD-10-CM | POA: Diagnosis not present

## 2024-05-18 DIAGNOSIS — H25812 Combined forms of age-related cataract, left eye: Secondary | ICD-10-CM | POA: Diagnosis not present

## 2024-05-18 DIAGNOSIS — Z961 Presence of intraocular lens: Secondary | ICD-10-CM | POA: Diagnosis not present

## 2024-05-29 DIAGNOSIS — D704 Cyclic neutropenia: Secondary | ICD-10-CM | POA: Diagnosis not present

## 2024-05-29 DIAGNOSIS — D696 Thrombocytopenia, unspecified: Secondary | ICD-10-CM | POA: Diagnosis not present

## 2024-05-31 ENCOUNTER — Ambulatory Visit (INDEPENDENT_AMBULATORY_CARE_PROVIDER_SITE_OTHER): Payer: Self-pay | Admitting: Family Medicine

## 2024-05-31 ENCOUNTER — Encounter (INDEPENDENT_AMBULATORY_CARE_PROVIDER_SITE_OTHER): Payer: Self-pay | Admitting: Family Medicine

## 2024-05-31 VITALS — BP 140/87 | HR 97 | Temp 97.8°F | Ht 63.0 in | Wt 197.0 lb

## 2024-05-31 DIAGNOSIS — Z7985 Long-term (current) use of injectable non-insulin antidiabetic drugs: Secondary | ICD-10-CM

## 2024-05-31 DIAGNOSIS — E669 Obesity, unspecified: Secondary | ICD-10-CM

## 2024-05-31 DIAGNOSIS — Z7984 Long term (current) use of oral hypoglycemic drugs: Secondary | ICD-10-CM

## 2024-05-31 DIAGNOSIS — Z6834 Body mass index (BMI) 34.0-34.9, adult: Secondary | ICD-10-CM | POA: Diagnosis not present

## 2024-05-31 DIAGNOSIS — I152 Hypertension secondary to endocrine disorders: Secondary | ICD-10-CM

## 2024-05-31 DIAGNOSIS — E1159 Type 2 diabetes mellitus with other circulatory complications: Secondary | ICD-10-CM

## 2024-05-31 MED ORDER — OZEMPIC (0.25 OR 0.5 MG/DOSE) 2 MG/3ML ~~LOC~~ SOPN: 0.5000 mg | PEN_INJECTOR | SUBCUTANEOUS | 0 refills | Status: DC

## 2024-05-31 NOTE — Progress Notes (Signed)
 Molly [name], D.O.  ABFM, ABOM Specializing in Clinical Bariatric Medicine  Office located at: [address] [place], [place]  [address]    FOR THE CHRONIC DISEASE OF OBESITY:   Morbid obesity (HCC) start BMI 39.50 BMI 34.0-34.9,adult - current BMI 34.91  Weight Summary and Body Composition Analysis  Weight Lost Since Last Visit: 0lb  Weight Gained Since Last Visit: 3lb    Vitals Temp: 97.8 F (36.6 C) BP: (!) 140/87 Pulse Rate: 97 SpO2: 99 %   Anthropometric Measurements Height: 5' 3 (1.6 m) Weight: 197 lb (89.4 kg) BMI (Calculated): 34.91 Weight at Last Visit: 194lb Weight Lost Since Last Visit: 0lb Weight Gained Since Last Visit: 3lb Starting Weight: 223lb Total Weight Loss (lbs): 26 lb (11.8 kg) Peak Weight: 230lb   Body Composition  Body Fat %: 43.4 % Fat Mass (lbs): 85.6 lbs Muscle Mass (lbs): 106 lbs Total Body Water (lbs): 74 lbs Visceral Fat Rating : 14   Other Clinical Data Fasting: no Labs: no Today's Visit #: 63 Starting Date: 08/31/17    Chief complaint: Obesity  Interval History Molly [name] is here for a follow-up office visit to discuss Molly [name] progress with Molly [name] obesity treatment plan. Molly [name] is on the Category 2 Plan  with only 100 snack calories and states Molly [name] is following Molly [name] eating plan approximately 80 % of the time. Molly [name] is doing aerobic classes at the [facility] 45  minutes 3 days per week  Molly [name] has experienced a weight gain of 3 lbs since last OV on 04/18/2024.   Molly [name] dietary and life habits include:  - Tracking Calories/Macros: sometimes tracks macros   - Eating More Whole Foods: yes  - Adequate Protein Intake: yes  - Adequate Water Intake: yes  - Skipping Meals: yes  - Sleeping 7-9 Hours/ Night: yes    04/18/24 12:00 05/31/24   Body Fat % 42.1 % 43.4 %  Muscle Mass (lbs) 106.8 lbs 106 lbs  Fat Mass (lbs) 81.8 lbs 85.6 lbs  Total Body Water (lbs) 73.8 lbs 74 lbs  Visceral Fat Rating  14 14   Counseling done on  how various foods will affect these numbers and how to maximize success  Total Fat mass lost to date: - 8.21 lbs Total lbs lost to date: -26 lbs Total weight loss percentage to date: -11.66 %   Nutritional and Behavioral Counseling:  We discussed the following today: avoiding skipping meals and continue to work on implementation of reduced calorie nutritional plan  Additional resources provided today: Handout on holiday eating strategies  Evidence-based interventions for health behavior change were utilized today including the discussion of self monitoring techniques, problem-solving barriers and SMART goal setting techniques.   Regarding patient's less desirable eating habits and patterns, we employed the technique of small changes.   SMART Goal(s) created today: avoid skipping meals   Recommended Dietary Goals Molly [name] is currently in the action stage of change. As such, Molly [name] goal is to continue weight management plan.  Molly [name] has agreed to continue the CAT 2 MP with only 100 snack cal   Recommended Physical Activity Goals Molly [name] has been advised to work up to 300-450 minutes of moderate intensity aerobic activity a week and strengthening exercises 2-3 times per week for cardiovascular health, weight loss maintenance and preservation of muscle mass.   Molly [name] was encouraged to  Continue to gradually increase the amount and intensity of exercise routine   Medical Interventions and Pharmacotherapy Previous Bariatric surgery: n/a Pharmacotherapy: Cont  Ozempic  and Metformin  at current dose.    OBESITY RELATED CONDITIONS ADDRESSED TODAY:   Medications Discontinued During This Encounter  Medication Reason   ondansetron  (ZOFRAN ) 4 MG tablet    Semaglutide ,0.25 or 0.5MG /DOS, (OZEMPIC , 0.25 OR 0.5 MG/DOSE,) 2 MG/3ML SOPN Reorder     Meds ordered this encounter  Medications   Semaglutide ,0.25 or 0.5MG /DOS, (OZEMPIC , 0.25 OR 0.5 MG/DOSE,) 2 MG/3ML SOPN    Sig: Inject 0.5 mg into the skin once  a week.    Dispense:  3 mL    Refill:  0     Type 2 diabetes mellitus in patient with obesity Molly [name]) Assessment & Plan: Lab Results  Component Value Date   HGBA1C 6.1 (H) 12/01/2023   HGBA1C 6.3 (H) 01/05/2022   HGBA1C 6.1 (H) 07/14/2021   INSULIN  25.2 (H) 01/05/2022   INSULIN  18.4 07/14/2021   INSULIN  17.6 01/20/2021    HgbA1c is controlled for age and comorbid conditions. Denies symptoms of hypoglycemia or hyperglycemia. On Metformin  500 mg twice daily and Ozempic  0.5 mg wkly with good adherence and no side effects. Denies GI side effects. Molly [name] sometimes struggles with carbohydrate cravings. After discussion, pt desires to maintain with medicines at current doses and instead work on avoiding skipping meals and increasing lean protein intake. Cont prudent nutritional plan and advance exercise as able.     Hypertension associated with type 2 diabetes mellitus (HCC) Assessment & Plan: BP Readings from Last 3 Encounters:  05/31/24 (!) 140/87  04/18/24 132/77  03/07/24 128/78   BP slightly above goal today. Molly [name] is completely asx and feels fine today. States BP at home is well-controlled. Cont Losartan  100 mg daily and Dyazide 37.5 - 25 mg daily. Continue with low sodium diet, advance exercise as tolerated    Objective:   PHYSICAL EXAM: Blood pressure (!) 140/87, pulse 97, temperature 97.8 F (36.6 C), height 5' 3 (1.6 m), weight 197 lb (89.4 kg), SpO2 99%. Body mass index is 34.9 kg/m.  General: Molly [name] is overweight, cooperative and in no acute distress. PSYCH: Has normal mood, affect and thought process.   HEENT: EOMI, sclerae are anicteric. Lungs: Normal breathing effort, no conversational dyspnea. Extremities: Moves * 4 Neurologic: A and O * 3, good insight  DIAGNOSTIC DATA REVIEWED: BMET    Component Value Date/Time   NA 140 12/01/2023 1229   K 4.2 12/01/2023 1229   CL 101 12/01/2023 1229   CO2 23 12/01/2023 1229   GLUCOSE 88 12/01/2023 1229   GLUCOSE 134 (H)  09/12/2017 2118   BUN 12 12/01/2023 1229   CREATININE 0.93 12/01/2023 1229   CREATININE 0.90 03/23/2016 1116   CALCIUM  10.6 (H) 12/01/2023 1229   GFRNONAA 55 (L) 07/10/2020 0940   GFRAA 63 07/10/2020 0940   Lab Results  Component Value Date   HGBA1C 6.1 (H) 12/01/2023   HGBA1C (H) 08/31/2010    6.0 (NOTE)                                                                       According to the ADA Clinical Practice Recommendations for 2011, when HbA1c is used as a screening test:   >=6.5%   Diagnostic of Diabetes Mellitus           (  if abnormal result  is confirmed)  5.7-6.4%   Increased risk of developing Diabetes Mellitus  References:Diagnosis and Classification of Diabetes Mellitus,Diabetes Care,2011,34(Suppl 1):S62-S69 and Standards of Medical Care in         Diabetes - 2011,Diabetes Care,2011,34  (Suppl 1):S11-S61.   Lab Results  Component Value Date   INSULIN  25.2 (H) 01/05/2022   INSULIN  20.4 08/31/2017   Lab Results  Component Value Date   TSH 1.680 08/31/2017   CBC    Component Value Date/Time   WBC 4.4 09/12/2017 2118   RBC 4.72 09/12/2017 2118   HGB 15.0 09/12/2017 2118   HGB 14.0 08/31/2017 1140   HCT 43.7 09/12/2017 2118   HCT 41.2 08/31/2017 1140   PLT 173 09/12/2017 2118   MCV 92.6 09/12/2017 2118   MCV 92 08/31/2017 1140   MCH 31.8 09/12/2017 2118   MCHC 34.3 09/12/2017 2118   RDW 13.1 09/12/2017 2118   RDW 13.7 08/31/2017 1140   Iron Studies No results found for: IRON, TIBC, FERRITIN, IRONPCTSAT Lipid Panel     Component Value Date/Time   CHOL 169 12/01/2023 1229   TRIG 100 12/01/2023 1229   HDL 65 12/01/2023 1229   CHOLHDL 2.6 12/01/2023 1229   CHOLHDL 2.6 08/31/2010 0540   VLDL 12 08/31/2010 0540   LDLCALC 86 12/01/2023 1229   Hepatic Function Panel     Component Value Date/Time   PROT 7.3 12/01/2023 1229   ALBUMIN 4.7 12/01/2023 1229   AST 21 12/01/2023 1229   ALT 12 12/01/2023 1229   ALKPHOS 59 12/01/2023 1229   BILITOT 0.5  12/01/2023 1229      Component Value Date/Time   TSH 1.680 08/31/2017 1140   Nutritional Lab Results  Component Value Date   VD25OH 45.1 12/01/2023   VD25OH 54.5 04/19/2023   VD25OH 59.2 01/05/2022     Follow up:   Return 07/05/2024 at 8:40 AM.  Molly [name] was informed of the importance of frequent follow up visits to maximize Molly [name] success with intensive lifestyle modifications for Molly [name] multiple health conditions.   Attestations:   I, Special Puri, acting as a stage manager for [employer], DO., have compiled all relevant documentation for today's office visit on behalf of Molly Jenkins, DO, while in the presence of [employer], DO.  Pertinent positives were addressed with patient today. Reviewed by clinician on day of visit: allergies, medications, problem list, medical history, surgical history, family history, social history, and previous encounter notes.  I have reviewed the above documentation for accuracy and completeness, and I agree with the above. Molly JINNY [name], D.O.  The 21st Century Cures Act was signed into law in 2016 which includes the topic of electronic health records.  This provides immediate access to information in MyChart. This includes consultation notes, operative notes, office notes, lab results and pathology reports.  If you have any questions about what you read please let us  know at your next visit so we can discuss your concerns and take corrective action if need be.  We are right here with you.

## 2024-06-06 DIAGNOSIS — E118 Type 2 diabetes mellitus with unspecified complications: Secondary | ICD-10-CM | POA: Diagnosis not present

## 2024-06-06 DIAGNOSIS — I10 Essential (primary) hypertension: Secondary | ICD-10-CM | POA: Diagnosis not present

## 2024-06-06 DIAGNOSIS — E785 Hyperlipidemia, unspecified: Secondary | ICD-10-CM | POA: Diagnosis not present

## 2024-06-06 DIAGNOSIS — Z0000 Encounter for general adult medical examination without abnormal findings: Secondary | ICD-10-CM | POA: Diagnosis not present

## 2024-06-06 DIAGNOSIS — D696 Thrombocytopenia, unspecified: Secondary | ICD-10-CM | POA: Diagnosis not present

## 2024-06-06 DIAGNOSIS — Z7984 Long term (current) use of oral hypoglycemic drugs: Secondary | ICD-10-CM | POA: Diagnosis not present

## 2024-06-06 DIAGNOSIS — F3342 Major depressive disorder, recurrent, in full remission: Secondary | ICD-10-CM | POA: Diagnosis not present

## 2024-06-06 DIAGNOSIS — E559 Vitamin D deficiency, unspecified: Secondary | ICD-10-CM | POA: Diagnosis not present

## 2024-07-01 ENCOUNTER — Other Ambulatory Visit (INDEPENDENT_AMBULATORY_CARE_PROVIDER_SITE_OTHER): Payer: Self-pay | Admitting: Family Medicine

## 2024-07-01 DIAGNOSIS — E669 Obesity, unspecified: Secondary | ICD-10-CM

## 2024-07-05 ENCOUNTER — Ambulatory Visit (INDEPENDENT_AMBULATORY_CARE_PROVIDER_SITE_OTHER): Admitting: Family Medicine

## 2024-07-05 ENCOUNTER — Other Ambulatory Visit (INDEPENDENT_AMBULATORY_CARE_PROVIDER_SITE_OTHER): Payer: Self-pay | Admitting: Family Medicine

## 2024-07-05 ENCOUNTER — Encounter (INDEPENDENT_AMBULATORY_CARE_PROVIDER_SITE_OTHER): Payer: Self-pay | Admitting: Family Medicine

## 2024-07-05 VITALS — BP 180/99 | HR 83 | Temp 98.1°F | Ht 63.0 in | Wt 194.0 lb

## 2024-07-05 DIAGNOSIS — E1169 Type 2 diabetes mellitus with other specified complication: Secondary | ICD-10-CM | POA: Diagnosis not present

## 2024-07-05 DIAGNOSIS — J302 Other seasonal allergic rhinitis: Secondary | ICD-10-CM | POA: Diagnosis not present

## 2024-07-05 DIAGNOSIS — E669 Obesity, unspecified: Secondary | ICD-10-CM

## 2024-07-05 DIAGNOSIS — E1159 Type 2 diabetes mellitus with other circulatory complications: Secondary | ICD-10-CM

## 2024-07-05 DIAGNOSIS — Z6834 Body mass index (BMI) 34.0-34.9, adult: Secondary | ICD-10-CM

## 2024-07-05 DIAGNOSIS — Z7985 Long-term (current) use of injectable non-insulin antidiabetic drugs: Secondary | ICD-10-CM

## 2024-07-05 DIAGNOSIS — I152 Hypertension secondary to endocrine disorders: Secondary | ICD-10-CM

## 2024-07-05 MED ORDER — SEMAGLUTIDE (1 MG/DOSE) 4 MG/3ML ~~LOC~~ SOPN: 1.0000 mg | PEN_INJECTOR | SUBCUTANEOUS | 0 refills | Status: AC

## 2024-07-05 MED ORDER — LEVOCETIRIZINE DIHYDROCHLORIDE 5 MG PO TABS: 5.0000 mg | ORAL_TABLET | Freq: Every evening | ORAL | 0 refills | Status: AC

## 2024-07-05 NOTE — Progress Notes (Signed)
 Molly [name], D.O.  ABFM, ABOM Specializing in Clinical Bariatric Medicine  Office located at: [address] [place], [place]  [address]    FOR THE CHRONIC DISEASE OF OBESITY:   Morbid obesity (HCC) start 39.50 BMI 34.0-34.9,adult - current BMI 34.37  Weight Summary and Body Composition Analysis  Weight Lost Since Last Visit: 3 lb  Weight Gained Since Last Visit: 0    Vitals Temp: 98.1 F (36.7 C) BP: (!) 180/99 Pulse Rate: 83 SpO2: 97 %   Anthropometric Measurements Height: 5' 3 (1.6 m) Weight: 194 lb (88 kg) BMI (Calculated): 34.37 Weight at Last Visit: 197 lb Weight Lost Since Last Visit: 3 lb Weight Gained Since Last Visit: 0 Starting Weight: 223 lb Total Weight Loss (lbs): 29 lb (13.2 kg) Peak Weight: 230 lb   Body Composition  Body Fat %: 43.2 % Fat Mass (lbs): 84 lbs Muscle Mass (lbs): 104.8 lbs Total Body Water (lbs): 73 lbs Visceral Fat Rating : 14   Other Clinical Data Fasting: No Labs: No Today's Visit #: 74 Starting Date: 08/31/17    Chief complaint: Obesity  Interval History Molly [name] is here for a follow-up office visit to discuss her progress with her obesity treatment plan. She is on the Category 2 Plan with only 100 snack calories and states she is following her eating plan approximately 80 % of the time. She is doing aerobic activities at the [facility] 45 minutes 3 days per week  She has experienced a weight loss of 3 lbs since last OV on 05/31/2024.   Her dietary and life habits include:  - Tracking Calories/Macros: yes  - Eating More Whole Foods: yes  - Adequate Protein Intake: yes  - Adequate Water Intake: yes  - Skipping Meals:  sometimes skips certain foods on her meal plan  - Sleeping 7-9 Hours/ Night: yes    05/31/24 07/05/24   Body Fat % 43.4 % 43.2 %  Muscle Mass (lbs) 106 lbs 104.8 lbs  Fat Mass (lbs) 85.6 lbs 84 lbs  Total Body Water (lbs) 74 lbs 73 lbs  Visceral Fat Rating  14 14   Counseling  done on how various foods will affect these numbers and how to maximize success  Total Fat mass lost to date: - 9.81 lbs Total lbs lost to date: - 29 lbs Total weight loss percentage to date: -13 %   Nutritional and Behavioral Counseling:  We discussed the following today: the concept of paying to play (intentionally increasing exercise on days they will deviate from meal plan) , increasing lean protein intake to established goals, and avoiding skipping meals  Additional resources provided today: Handout on December Goals, Handout on Click-Counting for Ozempic  pen  Evidence-based interventions for health behavior change were utilized today including the discussion of self monitoring techniques, problem-solving barriers and SMART goal setting techniques.   Regarding patient's less desirable eating habits and patterns, we employed the technique of small changes.   SMART Goal(s) created today: maintain weight through holidays   Recommended Dietary Goals Molly [name] is currently in the action stage of change. As such, her goal is to continue weight management plan.  She has agreed to continue the CAT 2 MP with only 100 snack calories.   Recommended Physical Activity Goals Molly [name] has been advised to work up to 300-450 minutes of moderate intensity aerobic activity a week and strengthening exercises 2-3 times per week for cardiovascular health, weight loss maintenance and preservation of muscle mass.  She has agreed to continue going to the [facility] and ADD 2 days of strength training per week.    Medical Interventions and Pharmacotherapy Previous Bariatric surgery: n/a Pharmacotherapy: See T2DM note.   OBESITY RELATED CONDITIONS ADDRESSED TODAY:   Medications Discontinued During This Encounter  Medication Reason   amLODipine  (NORVASC ) 10 MG tablet Patient Preference   Semaglutide ,0.25 or 0.5MG /DOS, (OZEMPIC , 0.25 OR 0.5 MG/DOSE,) 2 MG/3ML SOPN    levocetirizine (XYZAL ) 5 MG tablet Reorder      Meds ordered this encounter  Medications   Semaglutide , 1 MG/DOSE, 4 MG/3ML SOPN    Sig: Inject 1 mg as directed once a week.    Dispense:  9 mL    Refill:  0    Please dispense 90day supply to pt for mgt of her chronic DM   levocetirizine (XYZAL ) 5 MG tablet    Sig: Take 1 tablet (5 mg total) by mouth every evening.    Dispense:  90 tablet    Refill:  0     Type 2 diabetes mellitus in patient with obesity [facility]) Assessment & Plan: Reviewed labs with pt. HgbA1c is 5.7 and at goal for age and comorbid conditions. Denies symptoms of hypoglycemia or hyperglycemia.  On Metformin  500 mg twice daily and Ozempic  0.5 mg wkly with good adherence and no side effects. Reflux and constipation controlled on Protonix 40 mg daily prn and Miralax  prn respectively. She sometimes struggles with carbohydrate cravings. Shared decision making: INCREASE Ozempic  to 1 mg weekly. Reviewed anticipated benefits, possible risks. Demonstrated click counting on Ozempic  pen for 0.75 mg dose (54 clicks) if 1 mg is too strong for her; handout also provided.  Continue working on nutrition plan to decrease simple carbohydrates, increase lean proteins and exercise to promote weight loss and improve glycemic control.    Hypertension associated with type 2 diabetes mellitus (HCC) Assessment & Plan: BP Readings from Last 3 Encounters:  07/05/24 (!) 180/99  05/31/24 (!) 140/87  04/18/24 132/77   BP elevated today; she attributes this to rushing in the morning and forgetting to take her Dyazide and Losartan . Of note, she has been off Amlodipine  for over 3 months. She is asx and feels completely fine. BP at home averages 130s/70s-80s. Cont medications, low sodium meal plan, and daily BP monitoring. Advance exercise as able.     Seasonal allergies Assessment & Plan: Stable on Xyzal  5 mg daily. No acute concerns. Cont medication and low inflammatory meal plan.     Objective:   PHYSICAL EXAM: Blood pressure (!)  180/99, pulse 83, temperature 98.1 F (36.7 C), height 5' 3 (1.6 m), weight 194 lb (88 kg), SpO2 97%. Body mass index is 34.37 kg/m.  General: she is overweight, cooperative and in no acute distress. PSYCH: Has normal mood, affect and thought process.   HEENT: EOMI, sclerae are anicteric. Lungs: Normal breathing effort, no conversational dyspnea. Extremities: Moves * 4 Neurologic: A and O * 3, good insight  DIAGNOSTIC DATA REVIEWED: BMET    Component Value Date/Time   NA 140 12/01/2023 1229   K 4.2 12/01/2023 1229   CL 101 12/01/2023 1229   CO2 23 12/01/2023 1229   GLUCOSE 88 12/01/2023 1229   GLUCOSE 134 (H) 09/12/2017 2118   BUN 12 12/01/2023 1229   CREATININE 0.93 12/01/2023 1229   CREATININE 0.90 03/23/2016 1116   CALCIUM  10.6 (H) 12/01/2023 1229   GFRNONAA 55 (L) 07/10/2020 0940   GFRAA 63 07/10/2020 0940   Lab Results  Component  Value Date   HGBA1C 6.1 (H) 12/01/2023   HGBA1C (H) 08/31/2010    6.0 (NOTE)                                                                       According to the ADA Clinical Practice Recommendations for 2011, when HbA1c is used as a screening test:   >=6.5%   Diagnostic of Diabetes Mellitus           (if abnormal result  is confirmed)  5.7-6.4%   Increased risk of developing Diabetes Mellitus  References:Diagnosis and Classification of Diabetes Mellitus,Diabetes Care,2011,34(Suppl 1):S62-S69 and Standards of Medical Care in         Diabetes - 2011,Diabetes Care,2011,34  (Suppl 1):S11-S61.   Lab Results  Component Value Date   INSULIN  25.2 (H) 01/05/2022   INSULIN  20.4 08/31/2017   Lab Results  Component Value Date   TSH 1.680 08/31/2017   CBC    Component Value Date/Time   WBC 4.4 09/12/2017 2118   RBC 4.72 09/12/2017 2118   HGB 15.0 09/12/2017 2118   HGB 14.0 08/31/2017 1140   HCT 43.7 09/12/2017 2118   HCT 41.2 08/31/2017 1140   PLT 173 09/12/2017 2118   MCV 92.6 09/12/2017 2118   MCV 92 08/31/2017 1140   MCH 31.8  09/12/2017 2118   MCHC 34.3 09/12/2017 2118   RDW 13.1 09/12/2017 2118   RDW 13.7 08/31/2017 1140   Iron Studies No results found for: IRON, TIBC, FERRITIN, IRONPCTSAT Lipid Panel     Component Value Date/Time   CHOL 169 12/01/2023 1229   TRIG 100 12/01/2023 1229   HDL 65 12/01/2023 1229   CHOLHDL 2.6 12/01/2023 1229   CHOLHDL 2.6 08/31/2010 0540   VLDL 12 08/31/2010 0540   LDLCALC 86 12/01/2023 1229   Hepatic Function Panel     Component Value Date/Time   PROT 7.3 12/01/2023 1229   ALBUMIN 4.7 12/01/2023 1229   AST 21 12/01/2023 1229   ALT 12 12/01/2023 1229   ALKPHOS 59 12/01/2023 1229   BILITOT 0.5 12/01/2023 1229      Component Value Date/Time   TSH 1.680 08/31/2017 1140   Nutritional Lab Results  Component Value Date   VD25OH 45.1 12/01/2023   VD25OH 54.5 04/19/2023   VD25OH 59.2 01/05/2022     Follow up:   Return 08/09/2024 at 11:20 AM.  She was informed of the importance of frequent follow up visits to maximize her success with intensive lifestyle modifications for her multiple health conditions.   Attestations:   I, Special Puri, acting as a stage manager for [employer], DO., have compiled all relevant documentation for today's office visit on behalf of Molly Jenkins, DO, while in the presence of [employer], DO.  Pertinent positives were addressed with patient today. Reviewed by clinician on day of visit: allergies, medications, problem list, medical history, surgical history, family history, social history, and previous encounter notes.  I have spent 40 minutes in the care of the patient today including 30 minutes face-to-face assessing and reviewing listed medical problems above as outlined in office visit note and providing nutritional and behavioral counseling as outlined in obesity care plan.   I have reviewed the above documentation for accuracy  and completeness, and I agree with the above. Molly JINNY [name], D.O.  The 21st  Century Cures Act was signed into law in 2016 which includes the topic of electronic health records.  This provides immediate access to information in MyChart. This includes consultation notes, operative notes, office notes, lab results and pathology reports.  If you have any questions about what you read please let us  know at your next visit so we can discuss your concerns and take corrective action if need be.  We are right here with you.

## 2024-08-09 ENCOUNTER — Ambulatory Visit (INDEPENDENT_AMBULATORY_CARE_PROVIDER_SITE_OTHER): Admitting: Family Medicine
